# Patient Record
Sex: Female | Born: 1945
Health system: Southern US, Community
[De-identification: ages and names within clinical notes are randomized; demographics above are authoritative.]

## PROBLEM LIST (undated history)

## (undated) DIAGNOSIS — I499 Cardiac arrhythmia, unspecified: Secondary | ICD-10-CM

## (undated) DIAGNOSIS — K589 Irritable bowel syndrome without diarrhea: Secondary | ICD-10-CM

## (undated) DIAGNOSIS — F32A Depression, unspecified: Secondary | ICD-10-CM

## (undated) DIAGNOSIS — R002 Palpitations: Secondary | ICD-10-CM

## (undated) DIAGNOSIS — R112 Nausea with vomiting, unspecified: Secondary | ICD-10-CM

## (undated) DIAGNOSIS — Z780 Asymptomatic menopausal state: Secondary | ICD-10-CM

## (undated) DIAGNOSIS — Q223 Other congenital malformations of pulmonary valve: Secondary | ICD-10-CM

## (undated) DIAGNOSIS — F419 Anxiety disorder, unspecified: Secondary | ICD-10-CM

## (undated) DIAGNOSIS — R011 Cardiac murmur, unspecified: Secondary | ICD-10-CM

## (undated) DIAGNOSIS — Z9889 Other specified postprocedural states: Secondary | ICD-10-CM

## (undated) DIAGNOSIS — K5790 Diverticulosis of intestine, part unspecified, without perforation or abscess without bleeding: Secondary | ICD-10-CM

## (undated) DIAGNOSIS — I071 Rheumatic tricuspid insufficiency: Secondary | ICD-10-CM

## (undated) DIAGNOSIS — I5032 Chronic diastolic (congestive) heart failure: Secondary | ICD-10-CM

## (undated) DIAGNOSIS — M26609 Unspecified temporomandibular joint disorder, unspecified side: Secondary | ICD-10-CM

## (undated) DIAGNOSIS — R42 Dizziness and giddiness: Secondary | ICD-10-CM

## (undated) DIAGNOSIS — T7840XA Allergy, unspecified, initial encounter: Secondary | ICD-10-CM

## (undated) DIAGNOSIS — K635 Polyp of colon: Secondary | ICD-10-CM

## (undated) DIAGNOSIS — S0300XA Dislocation of jaw, unspecified side, initial encounter: Secondary | ICD-10-CM

## (undated) DIAGNOSIS — R739 Hyperglycemia, unspecified: Secondary | ICD-10-CM

## (undated) DIAGNOSIS — M47812 Spondylosis without myelopathy or radiculopathy, cervical region: Secondary | ICD-10-CM

## (undated) DIAGNOSIS — K219 Gastro-esophageal reflux disease without esophagitis: Secondary | ICD-10-CM

## (undated) DIAGNOSIS — I6529 Occlusion and stenosis of unspecified carotid artery: Secondary | ICD-10-CM

## (undated) DIAGNOSIS — I10 Essential (primary) hypertension: Secondary | ICD-10-CM

## (undated) DIAGNOSIS — Z2989 Encounter for other specified prophylactic measures: Secondary | ICD-10-CM

## (undated) DIAGNOSIS — N3090 Cystitis, unspecified without hematuria: Secondary | ICD-10-CM

## (undated) DIAGNOSIS — M75101 Unspecified rotator cuff tear or rupture of right shoulder, not specified as traumatic: Secondary | ICD-10-CM

## (undated) DIAGNOSIS — M1990 Unspecified osteoarthritis, unspecified site: Secondary | ICD-10-CM

## (undated) DIAGNOSIS — I739 Peripheral vascular disease, unspecified: Secondary | ICD-10-CM

## (undated) DIAGNOSIS — E7800 Pure hypercholesterolemia, unspecified: Secondary | ICD-10-CM

## (undated) DIAGNOSIS — D649 Anemia, unspecified: Secondary | ICD-10-CM

## (undated) DIAGNOSIS — Z87898 Personal history of other specified conditions: Secondary | ICD-10-CM

## (undated) DIAGNOSIS — Z298 Encounter for other specified prophylactic measures: Secondary | ICD-10-CM

## (undated) DIAGNOSIS — Z8619 Personal history of other infectious and parasitic diseases: Secondary | ICD-10-CM

## (undated) DIAGNOSIS — J302 Other seasonal allergic rhinitis: Secondary | ICD-10-CM

## (undated) DIAGNOSIS — G629 Polyneuropathy, unspecified: Secondary | ICD-10-CM

## (undated) DIAGNOSIS — R0600 Dyspnea, unspecified: Secondary | ICD-10-CM

## (undated) DIAGNOSIS — F329 Major depressive disorder, single episode, unspecified: Secondary | ICD-10-CM

## (undated) HISTORY — DX: Other congenital malformations of pulmonary valve: Q22.3

## (undated) HISTORY — DX: Palpitations: R00.2

## (undated) HISTORY — PX: EYE SURGERY: SHX253

## (undated) HISTORY — PX: ABDOMINAL HYSTERECTOMY: SHX81

## (undated) HISTORY — DX: Chronic diastolic (congestive) heart failure: I50.32

## (undated) HISTORY — DX: Peripheral vascular disease, unspecified: I73.9

## (undated) HISTORY — DX: Rheumatic tricuspid insufficiency: I07.1

## (undated) HISTORY — DX: Occlusion and stenosis of unspecified carotid artery: I65.29

## (undated) HISTORY — DX: Essential (primary) hypertension: I10

## (undated) HISTORY — DX: Polyneuropathy, unspecified: G62.9

## (undated) HISTORY — DX: Pure hypercholesterolemia, unspecified: E78.00

## (undated) HISTORY — DX: Spondylosis without myelopathy or radiculopathy, cervical region: M47.812

## (undated) HISTORY — DX: Hyperglycemia, unspecified: R73.9

## (undated) HISTORY — DX: Cystitis, unspecified without hematuria: N30.90

## (undated) HISTORY — DX: Asymptomatic menopausal state: Z78.0

## (undated) HISTORY — DX: Polyp of colon: K63.5

## (undated) HISTORY — DX: Personal history of other infectious and parasitic diseases: Z86.19

## (undated) HISTORY — PX: CHOLECYSTECTOMY: SHX55

## (undated) HISTORY — PX: BREAST BIOPSY: SHX20

---

## 2004-09-02 ENCOUNTER — Ambulatory Visit: Payer: Self-pay | Admitting: Internal Medicine

## 2004-09-19 ENCOUNTER — Inpatient Hospital Stay: Payer: Self-pay | Admitting: Internal Medicine

## 2006-02-03 ENCOUNTER — Other Ambulatory Visit: Payer: Self-pay

## 2006-02-03 ENCOUNTER — Inpatient Hospital Stay: Payer: Self-pay | Admitting: Surgery

## 2006-03-15 ENCOUNTER — Ambulatory Visit: Payer: Self-pay | Admitting: Unknown Physician Specialty

## 2006-04-27 ENCOUNTER — Ambulatory Visit: Payer: Self-pay | Admitting: Unknown Physician Specialty

## 2006-10-20 ENCOUNTER — Emergency Department: Payer: Self-pay | Admitting: Emergency Medicine

## 2008-02-14 ENCOUNTER — Ambulatory Visit: Payer: Self-pay | Admitting: Family Medicine

## 2008-02-19 ENCOUNTER — Ambulatory Visit: Payer: Self-pay | Admitting: Family Medicine

## 2008-02-26 ENCOUNTER — Ambulatory Visit: Payer: Self-pay | Admitting: Family Medicine

## 2008-02-27 ENCOUNTER — Ambulatory Visit: Payer: Self-pay | Admitting: Family Medicine

## 2008-06-12 ENCOUNTER — Ambulatory Visit: Payer: Self-pay | Admitting: Unknown Physician Specialty

## 2009-07-29 ENCOUNTER — Ambulatory Visit: Payer: Self-pay | Admitting: Family Medicine

## 2009-10-25 LAB — HM PAP SMEAR

## 2010-12-19 ENCOUNTER — Ambulatory Visit: Payer: Self-pay | Admitting: Family Medicine

## 2011-01-06 ENCOUNTER — Ambulatory Visit: Payer: Self-pay | Admitting: Specialist

## 2011-11-23 ENCOUNTER — Ambulatory Visit: Payer: Self-pay | Admitting: Family Medicine

## 2011-12-05 ENCOUNTER — Ambulatory Visit: Payer: Self-pay | Admitting: Specialist

## 2013-04-03 ENCOUNTER — Ambulatory Visit: Payer: Self-pay | Admitting: Family Medicine

## 2013-04-08 ENCOUNTER — Encounter: Payer: Self-pay | Admitting: Cardiovascular Disease

## 2013-04-08 ENCOUNTER — Ambulatory Visit (INDEPENDENT_AMBULATORY_CARE_PROVIDER_SITE_OTHER): Payer: Medicare Other | Admitting: Cardiovascular Disease

## 2013-04-08 ENCOUNTER — Telehealth: Payer: Self-pay | Admitting: *Deleted

## 2013-04-08 VITALS — BP 158/62 | HR 77 | Ht 67.0 in | Wt 174.2 lb

## 2013-04-08 DIAGNOSIS — R011 Cardiac murmur, unspecified: Secondary | ICD-10-CM | POA: Insufficient documentation

## 2013-04-08 DIAGNOSIS — R079 Chest pain, unspecified: Secondary | ICD-10-CM | POA: Insufficient documentation

## 2013-04-08 DIAGNOSIS — R059 Cough, unspecified: Secondary | ICD-10-CM

## 2013-04-08 DIAGNOSIS — R05 Cough: Secondary | ICD-10-CM

## 2013-04-08 DIAGNOSIS — I10 Essential (primary) hypertension: Secondary | ICD-10-CM

## 2013-04-08 DIAGNOSIS — R0602 Shortness of breath: Secondary | ICD-10-CM

## 2013-04-08 MED ORDER — CLONIDINE HCL 0.1 MG PO TABS: 0.1000 mg | ORAL_TABLET | Freq: Once | ORAL | Status: DC

## 2013-04-08 NOTE — Assessment & Plan Note (Signed)
Echocardiogram to evaluate cardiac function, right ventricular systolic pressures. Suspect some component of bronchospasm

## 2013-04-08 NOTE — Assessment & Plan Note (Signed)
Cough is concerning for reactive airway disease, bronchospasm. We have suggested she hold her ACE inhibitor for now to see if her cough gets better. Unable to exclude cardiac issue and echocardiogram has been ordered for this week.

## 2013-04-08 NOTE — Telephone Encounter (Signed)
Pharmacist has a question on her clonidine

## 2013-04-08 NOTE — Telephone Encounter (Signed)
Pharmacy is aware that Dr. Mariah Milling would like pt to take clonidine 1 table bid.

## 2013-04-08 NOTE — Assessment & Plan Note (Signed)
Echocardiogram has been ordered. Prior echocardiogram suggestive of pulmonary valve stenosis

## 2013-04-08 NOTE — Progress Notes (Signed)
Patient ID: Jessica Armstrong, female    DOB: [birth year], 67 y.o.   MRN: [id number]  HPI Comments: Ms. Jessica Armstrong is a present 67 year old woman, patient of Dr. Sullivan Lone presents by referral for evaluation of normal heart sounds/, cough.   She reports that she has significant secondhand exposure to smoke. For 30 years, she was very close to a heavy smoker and is concerned about secondary inhalation. She never smoked herself. Approximately 2 months ago, she developed runny nose, cold symptoms. She was treated with 2 courses of antibiotics, prednisone and various other medications. She has felt weak, had symptoms, nausea times, occasional chest fluttering. She has had worsening cough. Cough is at nighttime and daytime and unrelenting. She's been coughing to the extent that she has a coarse voice.   She had a chest x-ray 04/03/2013 suggesting reactive airway disease, unable to exclude COPD. She is concerned that some time she has ankle edema. She was recently started on prednisone taper. She's now down to 30 mg daily She was also started on various inhalers One of her biggest complaint is generalized  leg weakness  Previous carotid ultrasound in 2009 showed slight plaque formation bilaterally  EKG shows normal sinus rhythm with rate 77 beats per minute, no significant ST or T wave changes Notes from Dr. Sullivan Lone indicate that she has a diagnosis of pulmonary valve issue. 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 diazepam (VALIUM) 2 MG tablet Take 2 mg by mouth every 6 (six) hours as needed for anxiety.      Marland Kitchen estradiol (ESTRACE) 0.5 MG tablet Take 0.5 mg by mouth daily.      . fluticasone (FLONASE) 50 MCG/ACT nasal spray Place 1 spray into the nose daily.       . fluticasone (FLOVENT DISKUS) 50 MCG/BLIST diskus inhaler Inhale 2 puffs into the lungs 2 (two) times daily.      .  hydrochlorothiazide (HYDRODIURIL) 25 MG tablet Take 25 mg by mouth daily.      . meclizine (ANTIVERT) 25 MG tablet Take 25 mg by mouth daily as needed.      . meloxicam (MOBIC) 15 MG tablet Take 15 mg by mouth daily as needed for pain.      . Multiple Vitamin (MULTIVITAMIN) tablet Take 1 tablet by mouth daily.      Marland Kitchen omeprazole (PRILOSEC) 40 MG capsule Take 40 mg by mouth daily.      Marland Kitchen PARoxetine (PAXIL) 20 MG tablet Take 20 mg by mouth every morning.      . predniSONE (DELTASONE) 10 MG tablet Take 10 mg by mouth daily. Taper      . verapamil (COVERA HS) 180 MG (CO) 24 hr tablet Take 180 mg by mouth at bedtime.      Marland Kitchen  lisinopril (PRINIVIL,ZESTRIL) 20 MG tablet Take 20 mg by mouth daily.          Review of Systems  HENT: Negative.   Eyes: Negative.   Respiratory: Positive for cough and chest tightness.   Cardiovascular: Positive for chest pain and leg swelling.  Gastrointestinal: Negative.   Musculoskeletal: Negative.   Skin: Negative.   Neurological: Positive for weakness.  Psychiatric/Behavioral: Negative.   All other systems reviewed and are negative.    BP 158/62  Pulse 77  Ht 5\' 7"  (1.702 m)  Wt 174 lb 4 oz (79.039 kg)  BMI 27.28 kg/m2  Physical Exam  Nursing note and vitals reviewed. Constitutional: She is oriented to person, place, and time. She appears well-developed and well-nourished.  Significant coughing on today's visit, consistent with bronchospastic airway disease  HENT:  Head: Normocephalic.  Nose: Nose normal.  Mouth/Throat: Oropharynx is clear and moist.  Eyes: Conjunctivae are normal. Pupils are equal, round, and reactive to light.  Neck: Normal range of motion. Neck supple. No JVD present.  Cardiovascular: Normal rate, regular rhythm, S1 normal, S2 normal, normal heart sounds and intact distal pulses.  Exam reveals no gallop and no friction rub.   No murmur heard. Pulmonary/Chest: Effort normal and breath sounds normal. No respiratory distress. She has  no wheezes. She has no rales. She exhibits no tenderness.  Abdominal: Soft. Bowel sounds are normal. She exhibits no distension. There is no tenderness.  Musculoskeletal: Normal range of motion. She exhibits no edema and no tenderness.  Lymphadenopathy:    She has no cervical adenopathy.  Neurological: She is alert and oriented to person, place, and time. Coordination normal.  Skin: Skin is warm and dry. No rash noted. No erythema.  Psychiatric: She has a normal mood and affect. Her behavior is normal. Judgment and thought content normal.    Assessment and Plan

## 2013-04-08 NOTE — Assessment & Plan Note (Signed)
We'll hold her ACE inhibitor to see if the cough gets better. Options for blood pressure control are losartan or clonidine. There are other options as well . We did call clonidine into the pharmacy. she will see Dr. Sullivan Lone in 2 days' time and we have suggested she talk with him about which medicine might be the best for her.

## 2013-04-08 NOTE — Patient Instructions (Addendum)
Stop the lisinopril (this can cause a cough) Start clonidine one pill twice a day (we could also start losartan instead)  We will schedule an echocardiogram for SOB, cough, chest tightness, murmur  Please call us if you have new issues that need to be addressed before your next appt.

## 2013-04-08 NOTE — Assessment & Plan Note (Signed)
Suspect her chest tightness is from significant coughing daytime and nighttime. No further ischemia workup at this time.

## 2013-04-10 ENCOUNTER — Other Ambulatory Visit (INDEPENDENT_AMBULATORY_CARE_PROVIDER_SITE_OTHER): Payer: Medicare Other

## 2013-04-10 ENCOUNTER — Other Ambulatory Visit: Payer: Self-pay

## 2013-04-10 DIAGNOSIS — R05 Cough: Secondary | ICD-10-CM

## 2013-04-10 DIAGNOSIS — R0602 Shortness of breath: Secondary | ICD-10-CM

## 2013-04-10 DIAGNOSIS — R059 Cough, unspecified: Secondary | ICD-10-CM

## 2013-04-10 DIAGNOSIS — R011 Cardiac murmur, unspecified: Secondary | ICD-10-CM

## 2013-04-10 DIAGNOSIS — R079 Chest pain, unspecified: Secondary | ICD-10-CM

## 2013-04-14 ENCOUNTER — Telehealth: Payer: Self-pay

## 2013-04-14 NOTE — Telephone Encounter (Signed)
Pt would like echo results 

## 2013-04-16 ENCOUNTER — Encounter: Payer: Self-pay | Admitting: *Deleted

## 2013-04-18 ENCOUNTER — Encounter: Payer: Self-pay | Admitting: Pulmonary Disease

## 2013-04-21 ENCOUNTER — Encounter: Payer: Self-pay | Admitting: Pulmonary Disease

## 2013-04-21 ENCOUNTER — Ambulatory Visit (INDEPENDENT_AMBULATORY_CARE_PROVIDER_SITE_OTHER): Payer: Medicare Other | Admitting: Pulmonary Disease

## 2013-04-21 VITALS — BP 178/80 | HR 98 | Temp 97.9°F | Ht 67.0 in | Wt 171.4 lb

## 2013-04-21 DIAGNOSIS — R059 Cough, unspecified: Secondary | ICD-10-CM

## 2013-04-21 DIAGNOSIS — R042 Hemoptysis: Secondary | ICD-10-CM | POA: Insufficient documentation

## 2013-04-21 DIAGNOSIS — I10 Essential (primary) hypertension: Secondary | ICD-10-CM

## 2013-04-21 DIAGNOSIS — R05 Cough: Secondary | ICD-10-CM

## 2013-04-21 MED ORDER — OLMESARTAN MEDOXOMIL 40 MG PO TABS: 20.0000 mg | ORAL_TABLET | Freq: Every day | ORAL | Status: DC

## 2013-04-21 NOTE — Progress Notes (Signed)
Subjective:    Patient ID: Jessica Armstrong, female    DOB: February 05, 1946, 67 y.o.   MRN: [id number]  HPI   Jessica Armstrong is a very pleasant 67 year old female who comes her clinic today for evaluation of cough. She states that she had a normal childhood without respiratory illnesses that throughout adulthood she's been prescribed albuterol often on for cough and shortness of breath. She is unaware of a prior diagnosis of asthma or COPD. 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 She says that the other person working in the room with her at smoke multiple packs of cigarettes per day and if there is frequently "a cloud of smoke" in the room.  For the last 2 half months she's had a cough associated with sinus congestion. She's been treated with 3 separate antibiotics which she said has not helped. She never has significant headache or sinus pressure but she still has a postnasal drip. His problem is been associated with hoarseness. She feels a constant dry, scratchy throat. She coughs all day. Talking more make her cough more. About 6 weeks ago she was started on lisinopril. She took it for a total of about 2-1/2 weeks and then this was held by her cardiologist. She does not cough at night. She does not associate the cough with heartburn. She does not feel certain foods will make her cough any worse or better.    Past Medical History  Diagnosis Date  . Hypercholesterolemia   . Post-menopausal   . Hypertension   . Claudication   . Hyperglycemia   . Cystitis   . Palpitations   . Neuropathy   . Rhinitis   . Colon polyp   . History of measles   . History of mumps   . Pulmonary valve dysplasia      Family History  Problem Relation Age of Onset  . Heart disease Mother   . Hypertension Mother   . Hypertension Brother   . Colon cancer Father   . Asthma Maternal Grandfather   . COPD Mother     was a smoker     History   Social  History  . Marital Status: Married    Spouse Name: N/A    Number of Children: N/A  . Years of Education: N/A   Occupational History  . Not on file.   Social History Main Topics  . Smoking status: Never Smoker   . 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 Alcohol Use: Yes     Comment: rare use 1-2 drinks per tyear.   . Drug Use: No  . Sexually Active: Not on file   Other Topics Concern  . Not on file   Social History Narrative  . No narrative on file     Allergies  Allergen Reactions  . Codeine Sulfate   . Keflex (Cephalexin)   . Macrobid (Nitrofurantoin Macrocrystal)   . Sulfa Antibiotics      Outpatient Prescriptions Prior to Visit  Medication Sig Dispense Refill  . estradiol (ESTRACE) 0.5 MG tablet Take 0.5 mg by mouth daily.      . hydrochlorothiazide (HYDRODIURIL) 25 MG tablet Take 25 mg by mouth daily.      . meclizine (ANTIVERT) 25 MG tablet Take 25 mg by mouth daily as needed.      . meloxicam (MOBIC) 15 MG tablet Take  15 mg by mouth daily as needed for pain.      . Multiple Vitamin (MULTIVITAMIN) tablet Take 1 tablet by mouth daily.      Marland Kitchen omeprazole (PRILOSEC) 40 MG capsule Take 40 mg by mouth daily.      Marland Kitchen PARoxetine (PAXIL) 20 MG tablet Take 20 mg by mouth every morning.      . verapamil (COVERA HS) 180 MG (CO) 24 hr tablet Take 180 mg by mouth at bedtime.      . cloNIDine (CATAPRES) 0.1 MG tablet Take 1 tablet (0.1 mg total) by mouth once.  60 tablet  11  . diazepam (VALIUM) 2 MG tablet Take 2 mg by mouth every 6 (six) hours as needed for anxiety.      . fluticasone (FLONASE) 50 MCG/ACT nasal spray Place 1 spray into the nose daily.       . fluticasone (FLOVENT DISKUS) 50 MCG/BLIST diskus inhaler Inhale 2 puffs into the lungs 2 (two) times daily.      . predniSONE (DELTASONE) 10 MG tablet Take 10 mg by mouth daily. Taper       No facility-administered medications prior to visit.      Review of  Systems  Constitutional: Negative for fever, chills, diaphoresis, appetite change and fatigue.  HENT: Positive for rhinorrhea and postnasal drip. Negative for hearing loss, nosebleeds, congestion, sore throat, trouble swallowing, neck stiffness and sinus pressure.   Eyes: Negative for discharge, redness and visual disturbance.  Respiratory: Positive for cough and shortness of breath. Negative for choking, chest tightness and wheezing.   Cardiovascular: Negative for chest pain and leg swelling.  Gastrointestinal: Negative for nausea, abdominal pain, diarrhea, constipation and blood in stool.  Genitourinary: Negative for dysuria, frequency and hematuria.  Musculoskeletal: Negative for myalgias, joint swelling and arthralgias.  Skin: Negative for color change, pallor and rash.  Neurological: Negative for dizziness, seizures, facial asymmetry, speech difficulty, light-headedness, numbness and headaches.  Hematological: Negative for adenopathy. Does not bruise/bleed easily.       Objective:   Physical Exam  Filed Vitals:   04/21/13 1041  BP: 178/80  Pulse: 98  Temp: 97.9 F (36.6 C)  TempSrc: Oral  Height: 5\' 7"  (1.702 m)  Weight: 171 lb 6.4 oz (77.747 kg)  SpO2: 95%   Gen: well appearing, no acute distress HEENT: NCAT, PERRL, EOMi, OP clear, neck supple without masses PULM: CTA B CV: RRR, systolic murmur noted, no JVD AB: BS+, soft, nontender, no hsm Ext: warm, no edema, no clubbing, no cyanosis Derm: no rash or skin breakdown Neuro: A&Ox4, CN II-XII intact, strength 5/5 in all 4 extremities       Assessment & Plan:   Essential hypertension Her lisinopril was recently held (with which I completely agree) because of her cough. However unfortunately she is still markedly hypertensive today. We will prescribe Benicar 20 mg daily.  Cough I explained to Jessica Armstrong that I think the most likely etiology of her cough is postnasal drip and cyclical cough (when cough causes upper airway  inflammation in your patient does promoting more cough).  It is possible that lisinopril could contribute somewhat during the brief time that she was taking it.  She coughed up blood once in the last few weeks which I think is likely due to upper airway eructation from all the coughing, but considering her significant secondhand smoke exposure over the years it is reasonable to order a CT scan to look for a clear cause of this.  Her simple  spirometry today was normal, thus she does not have COPD. I wonder if she has some mild intermittent asthma as she has been treated with albuterol off and on over the years. However, she does not describe shortness of breath or chest tightness and so this seems less likely right now.  Plan: -Voice rest for 3 days with Delsym to suppress cough and hard candies for voice rest and active cough suppression -Hold Dulera -Start Nasacort for postnasal drip -Over-the-counter antihistamine -Followup with [place] in 6-8 weeks  Hemoptysis This occurred only one time and therefore I think it is likely related to mild bronchitis versus inflammation in her airways from recurrent coughing. However, considering her sick significant secondhand smoke exposure we will set up a CT scan as she is quite worried about the possibility of lung cancer.   Updated Medication List Outpatient Encounter Prescriptions as of 04/21/2013  Medication Sig Dispense Refill  . albuterol (PROAIR HFA) 108 (90 BASE) MCG/ACT inhaler Inhale 2 puffs into the lungs every 6 (six) hours as needed for wheezing or shortness of breath.      Tery Sanfilippo Calcium (STOOL SOFTENER PO) Take 1 tablet by mouth daily.      Marland Kitchen estradiol (ESTRACE) 0.5 MG tablet Take 0.5 mg by mouth daily.      . hydrochlorothiazide (HYDRODIURIL) 25 MG tablet Take 25 mg by mouth daily.      . meclizine (ANTIVERT) 25 MG tablet Take 25 mg by mouth daily as needed.      . meloxicam (MOBIC) 15 MG tablet Take 15 mg by mouth daily as needed for  pain.      . Multiple Vitamin (MULTIVITAMIN) tablet Take 1 tablet by mouth daily.      Marland Kitchen omeprazole (PRILOSEC) 40 MG capsule Take 40 mg by mouth daily.      Marland Kitchen PARoxetine (PAXIL) 20 MG tablet Take 20 mg by mouth every morning.      . verapamil (COVERA HS) 180 MG (CO) 24 hr tablet Take 180 mg by mouth at bedtime.      . mometasone-formoterol (DULERA) 200-5 MCG/ACT AERO Inhale 2 puffs into the lungs 2 (two) times daily.      Marland Kitchen olmesartan (BENICAR) 40 MG tablet Take 0.5 tablets (20 mg total) by mouth daily.  30 tablet  2  . [DISCONTINUED] cloNIDine (CATAPRES) 0.1 MG tablet Take 1 tablet (0.1 mg total) by mouth once.  60 tablet  11  . [DISCONTINUED] diazepam (VALIUM) 2 MG tablet Take 2 mg by mouth every 6 (six) hours as needed for anxiety.      . [DISCONTINUED] fluticasone (FLONASE) 50 MCG/ACT nasal spray Place 1 spray into the nose daily.       . [DISCONTINUED] fluticasone (FLOVENT DISKUS) 50 MCG/BLIST diskus inhaler Inhale 2 puffs into the lungs 2 (two) times daily.      . [DISCONTINUED] predniSONE (DELTASONE) 10 MG tablet Take 10 mg by mouth daily. Taper       No facility-administered encounter medications on file as of 04/21/2013.

## 2013-04-21 NOTE — Assessment & Plan Note (Signed)
This occurred only one time and therefore I think it is likely related to mild bronchitis versus inflammation in her airways from recurrent coughing. However, considering her sick significant secondhand smoke exposure we will set up a CT scan as she is quite worried about the possibility of lung cancer.

## 2013-04-21 NOTE — Assessment & Plan Note (Signed)
Her lisinopril was recently held (with which I completely agree) because of her cough. However unfortunately she is still markedly hypertensive today. We will prescribe Benicar 20 mg daily.

## 2013-04-21 NOTE — Assessment & Plan Note (Signed)
I explained to Jessica Armstrong that I think the most likely etiology of her cough is postnasal drip and cyclical cough (when cough causes upper airway inflammation in your patient does promoting more cough).  It is possible that lisinopril could contribute somewhat during the brief time that she was taking it.  She coughed up blood once in the last few weeks which I think is likely due to upper airway eructation from all the coughing, but considering her significant secondhand smoke exposure over the years it is reasonable to order a CT scan to look for a clear cause of this.  Her simple spirometry today was normal, thus she does not have COPD. I wonder if she has some mild intermittent asthma as she has been treated with albuterol off and on over the years. However, she does not describe shortness of breath or chest tightness and so this seems less likely right now.  Plan: -Voice rest for 3 days with Delsym to suppress cough and hard candies for voice rest and active cough suppression -Hold Dulera -Start Nasacort for postnasal drip -Over-the-counter antihistamine -Followup with [place] in 6-8 weeks

## 2013-04-21 NOTE — Patient Instructions (Addendum)
We will set up a CT scan of your chest to look for the cause of you coughing up blood  I want you to rest your voice for three days.  Use Delsym over the counter regularly during this time as well as chlorpheniramine (both over the counter).  Also use hard candies, hot beverages to sooth your throat.  Don't talk, laugh, or sing and try your best to suppress the cough during this time.  Use Nasacort over the counter for the sinus congestion.  2 puffs each nostril daily.  We will see you back in 6-8 weeks or sooner if needed

## 2013-04-24 ENCOUNTER — Ambulatory Visit: Payer: Self-pay | Admitting: Pulmonary Disease

## 2013-04-28 ENCOUNTER — Telehealth: Payer: Self-pay | Admitting: Family Medicine

## 2013-04-29 ENCOUNTER — Telehealth: Payer: Self-pay | Admitting: Pulmonary Disease

## 2013-04-29 DIAGNOSIS — R042 Hemoptysis: Secondary | ICD-10-CM

## 2013-04-29 NOTE — Telephone Encounter (Signed)
Pt calling again in ref to previous msg says she's unhappy that noone has called her with this.Jessica Armstrong

## 2013-04-29 NOTE — Telephone Encounter (Signed)
Pt is advised of results. Pt still wants Dr. Henrene Pastor to call her once he returns so she can discuss the plan from here. Carron Curie, CMA

## 2013-04-29 NOTE — Telephone Encounter (Signed)
I spoke with the pt and she is very upset that she has not been given CT results. She states that the CT tech at [facility] told her that the results would be available in 24 hours. I advised that once we receive the results the MD has to review them. Pt has CT on 04-24-13 and BQ has been out of office since then. I advised the pt that BQ does not return until Monday. Pt was very upset so I offered to try to get another MD to review the CT results. Pt states to just forget it and have BQ cal lon his return. I have the results and will ask MW to review results. I have placed results in MW look-at.  Please advise. Carron Curie, CMA

## 2013-04-29 NOTE — Telephone Encounter (Signed)
Ct is completely negative for any abnormality or source of hemoptysis

## 2013-05-05 ENCOUNTER — Telehealth: Payer: Self-pay | Admitting: Pulmonary Disease

## 2013-05-05 ENCOUNTER — Ambulatory Visit: Payer: Self-pay | Admitting: Family Medicine

## 2013-05-05 NOTE — Telephone Encounter (Signed)
Tried to call patient to discuss results of CT chest (normal).  Had to leave a message.

## 2013-05-06 ENCOUNTER — Telehealth: Payer: Self-pay | Admitting: *Deleted

## 2013-05-06 DIAGNOSIS — R0602 Shortness of breath: Secondary | ICD-10-CM

## 2013-05-06 NOTE — Telephone Encounter (Signed)
Please scheduled full PFT's for her at [facility] before she comes back to see me  Pt is aware of PFT. Carron Curie, CMA

## 2013-05-06 NOTE — Telephone Encounter (Signed)
Order sent to [facility] for the PFT  [facility] for the pt to make her aware

## 2013-05-06 NOTE — Telephone Encounter (Signed)
Message copied by Christen Butter on Tue May 06, 2013 10:07 AM ------      Message from: Max Fickle B      Created: Tue May 06, 2013  9:25 AM       L,            Please scheduled full PFT's for her at [facility] before she comes back to see me            Thanks      B ------

## 2013-05-06 NOTE — Telephone Encounter (Signed)
Pt returned call. She says leslie called her and asked her to call our office @ [phone]. Pt says "they were trying to schedule some type of test perhaps". Call pt at [phone]. Jessica Armstrong

## 2013-05-13 ENCOUNTER — Ambulatory Visit: Payer: Self-pay | Admitting: Family Medicine

## 2013-05-14 ENCOUNTER — Other Ambulatory Visit: Payer: Self-pay

## 2013-05-20 ENCOUNTER — Ambulatory Visit: Payer: Medicare Other

## 2013-05-20 VITALS — BP 162/68 | Ht 67.0 in | Wt 172.8 lb

## 2013-05-20 DIAGNOSIS — I10 Essential (primary) hypertension: Secondary | ICD-10-CM

## 2013-05-20 MED ORDER — LOSARTAN POTASSIUM 50 MG PO TABS: 50.0000 mg | ORAL_TABLET | Freq: Every day | ORAL | Status: DC

## 2013-05-20 NOTE — Progress Notes (Signed)
Pt presents in office today after having gone to Dr Elisabeth Cara office to pick up paperwork and requesting a BP check secondary to lethargy and feeling poorly.  Pt states they checked her BP and reported it as 162/20 and told her she needed to see her cardiologist ASAP.  Checked BP here today in office BP 186/78 1st check, 162/68 2nd check.  Pt is presently on Verapamil 180mg  BID and HCTZ 25mg  QD.  Pt has previously been on Lisinopril d/c secondary to cough.  Changed to Clonidine by Dr Mariah Milling which was discontinued by Dr Sullivan Lone.  Discussed BP with Dr Mariah Milling he recommends pt start taking Losartan 50mg  QD, rx sent into pharmacy, pt aware.  Pt made f/u appt with him in 2-4 weeks to discuss Echo results and her lethargy.

## 2013-05-22 ENCOUNTER — Ambulatory Visit: Payer: Medicare Other | Admitting: Cardiovascular Disease

## 2013-05-27 ENCOUNTER — Ambulatory Visit: Payer: Self-pay | Admitting: Pulmonary Disease

## 2013-05-27 LAB — PULMONARY FUNCTION TEST

## 2013-06-10 ENCOUNTER — Ambulatory Visit (INDEPENDENT_AMBULATORY_CARE_PROVIDER_SITE_OTHER): Payer: Medicare Other | Admitting: Pulmonary Disease

## 2013-06-10 ENCOUNTER — Encounter: Payer: Self-pay | Admitting: Pulmonary Disease

## 2013-06-10 VITALS — BP 128/64 | HR 100 | Temp 97.9°F | Ht 67.0 in | Wt 173.1 lb

## 2013-06-10 DIAGNOSIS — R059 Cough, unspecified: Secondary | ICD-10-CM

## 2013-06-10 DIAGNOSIS — R05 Cough: Secondary | ICD-10-CM

## 2013-06-10 DIAGNOSIS — R232 Flushing: Secondary | ICD-10-CM | POA: Insufficient documentation

## 2013-06-10 DIAGNOSIS — N951 Menopausal and female climacteric states: Secondary | ICD-10-CM

## 2013-06-10 LAB — TSH: TSH: 2.39 u[IU]/mL (ref 0.35–5.50)

## 2013-06-10 MED ORDER — FLUTICASONE PROPIONATE 50 MCG/ACT NA SUSP: 2.0000 | Freq: Every day | NASAL | Status: DC

## 2013-06-10 NOTE — Assessment & Plan Note (Signed)
Jessica Armstrong's cough has nearly completely resolved.  I think that it was mostly due to the [employer] and what is left is related to post nasal drip.   Plan: -start Flonase 2 sprays each nostril daily -generic zyrtec -f/u with me in 6 months or sooner if needed

## 2013-06-10 NOTE — Progress Notes (Signed)
Subjective:    Patient ID: Jessica Armstrong, female    DOB: 07/18/1946, 67 y.o.   MRN: [id number]  Synopsis: Ms. Jessica Armstrong first saw the [facility] in the summer of 2014 for cough due to an ACE-In as well as post nasal drip. HPI   06/10/2013 ROV >>  Chief Complaint  Patient presents with  . Follow-up    Pt states that her cough is improving, and is no longer prod. She states breathing is unchanged, "not breathing comfortably". She also c/o sweating-with or without exertion and occurs day or night. x 2 wks.    Since the last visit Kavina's cough has improved significantly.  She has not taken any more lisinopril. She still has some post nasal drip and mild hoarseness.  She has not taken anything for the post nasal drip.  She denies dyspnea.  She has not been taking the [facility].   Past Medical History  Diagnosis Date  . Hypercholesterolemia   . Post-menopausal   . Hypertension   . Claudication   . Hyperglycemia   . Cystitis   . Palpitations   . Neuropathy   . Rhinitis   . Colon polyp   . History of measles   . History of mumps   . Pulmonary valve dysplasia      Review of Systems  Constitutional: Negative for fever, chills and fatigue.  HENT: Positive for congestion, rhinorrhea and postnasal drip.   Respiratory: Positive for cough. Negative for shortness of breath and wheezing.   Cardiovascular: Negative for chest pain, palpitations and leg swelling.       Objective:   Physical Exam  Filed Vitals:   06/10/13 1331  BP: 128/64  Pulse: 100  Temp: 97.9 F (36.6 C)  TempSrc: Oral  Height: 5\' 7"  (1.702 m)  Weight: 173 lb 1.9 oz (78.527 kg)  SpO2: 96%   Gen: no acute distress HEENT: NCAT, PERRL PULM: CTA B CV: RRR, no mgr AB: soft, non distended or tender Ext: warm, no edema      Assessment & Plan:   Cough Kayah's cough has nearly completely resolved.  I think that it was mostly due to the Ace-Inhibitor and what is left is related to post nasal drip.    Plan: -start Flonase 2 sprays each nostril daily -generic zyrtec -f/u with me in 6 months or sooner if needed  Hot flashes She notes intermittent dyspnea, hot flashes and sweating.  This sounds like a hormonal issue.  Will check a TSH, but if that is normal she will need to follow up with her PCP for a further work up.    Updated Medication List Outpatient Encounter Prescriptions as of 06/10/2013  Medication Sig Dispense Refill  . albuterol (PROAIR HFA) 108 (90 BASE) MCG/ACT inhaler Inhale 2 puffs into the lungs every 6 (six) hours as needed for wheezing or shortness of breath.      Tery Sanfilippo Calcium (STOOL SOFTENER PO) Take 1 tablet by mouth daily.      Marland Kitchen estradiol (ESTRACE) 0.5 MG tablet Take 0.5 mg by mouth daily.      . hydrochlorothiazide (HYDRODIURIL) 25 MG tablet Take 25 mg by mouth daily.      Marland Kitchen losartan (COZAAR) 50 MG tablet Take 1 tablet (50 mg total) by mouth daily.  30 tablet  6  . meclizine (ANTIVERT) 25 MG tablet Take 25 mg by mouth daily as needed.      . Multiple Vitamin (MULTIVITAMIN) tablet Take 1 tablet by mouth  daily.      . omeprazole (PRILOSEC) 40 MG capsule Take 40 mg by mouth daily.      Marland Kitchen PARoxetine (PAXIL) 20 MG tablet Take 20 mg by mouth every morning.      . verapamil (COVERA HS) 180 MG (CO) 24 hr tablet Take 360 mg by mouth at bedtime.       . [DISCONTINUED] meloxicam (MOBIC) 15 MG tablet Take 15 mg by mouth daily as needed for pain.      . [DISCONTINUED] mometasone-formoterol (DULERA) 200-5 MCG/ACT AERO Inhale 2 puffs into the lungs 2 (two) times daily.      . [DISCONTINUED] olmesartan (BENICAR) 40 MG tablet Take 0.5 tablets (20 mg total) by mouth daily.  30 tablet  2   No facility-administered encounter medications on file as of 06/10/2013.

## 2013-06-10 NOTE — Assessment & Plan Note (Signed)
She notes intermittent dyspnea, hot flashes and sweating.  This sounds like a hormonal issue.  Will check a TSH, but if that is normal she will need to follow up with her PCP for a further work up.

## 2013-06-10 NOTE — Addendum Note (Signed)
Addended by: Montine Circle D on: 06/10/2013 02:27 PM   Modules accepted: Orders

## 2013-06-10 NOTE — Patient Instructions (Signed)
Take the flonase 2 sprays each nostril daily  Take the zyrtec daily (cetirizine generic)  We will call you with the result of the blood work  Follow up with Dr. Sullivan Lone regarding the hot flashes  We will see you back in 6 months or sooner if needed

## 2013-06-17 ENCOUNTER — Telehealth: Payer: Self-pay | Admitting: Pulmonary Disease

## 2013-06-17 MED ORDER — FLUTICASONE PROPIONATE 50 MCG/ACT NA SUSP: 2.0000 | Freq: Every day | NASAL | Status: DC

## 2013-06-17 NOTE — Telephone Encounter (Signed)
The patient wants her prescription for fluticasone (FLONASE) 50 MCG/ACT nasal spray   sent to [facility] instead of [facility]. 90 day supply.

## 2013-06-17 NOTE — Telephone Encounter (Signed)
Rx has been sent to [facility] per the pt's request. Pt is aware. Nothing further is needed.

## 2013-06-19 ENCOUNTER — Encounter: Payer: Self-pay | Admitting: Pulmonary Disease

## 2013-07-18 ENCOUNTER — Ambulatory Visit: Payer: Self-pay | Admitting: Unknown Physician Specialty

## 2013-07-18 LAB — CBC WITH DIFFERENTIAL/PLATELET
Basophil #: 0.1 10*3/uL (ref 0.0–0.1)
Basophil %: 0.6 %
Eosinophil #: 0.2 10*3/uL (ref 0.0–0.7)
Eosinophil %: 1.9 %
HCT: 34.5 % — ABNORMAL LOW (ref 35.0–47.0)
HGB: 11.9 g/dL — ABNORMAL LOW (ref 12.0–16.0)
Lymphocyte #: 1.4 10*3/uL (ref 1.0–3.6)
Lymphocyte %: 14.8 %
MCH: 30.9 pg (ref 26.0–34.0)
MCHC: 34.6 g/dL (ref 32.0–36.0)
MCV: 89 fL (ref 80–100)
Monocyte #: 0.8 x10 3/mm (ref 0.2–0.9)
Monocyte %: 8.8 %
Neutrophil #: 7.1 10*3/uL — ABNORMAL HIGH (ref 1.4–6.5)
Neutrophil %: 73.9 %
Platelet: 241 10*3/uL (ref 150–440)
RBC: 3.87 10*6/uL (ref 3.80–5.20)
RDW: 12 % (ref 11.5–14.5)
WBC: 9.7 10*3/uL (ref 3.6–11.0)

## 2013-07-18 LAB — HM COLONOSCOPY

## 2013-07-21 LAB — PATHOLOGY REPORT

## 2013-08-04 ENCOUNTER — Other Ambulatory Visit: Payer: Self-pay | Admitting: Neurosurgery

## 2013-08-14 ENCOUNTER — Other Ambulatory Visit: Payer: Self-pay

## 2013-08-15 ENCOUNTER — Encounter (HOSPITAL_COMMUNITY)
Admission: RE | Admit: 2013-08-15 | Discharge: 2013-08-15 | Disposition: A | Discharge status: Home / Self Care | Payer: Medicare Other | Source: Ambulatory Visit | Attending: Anesthesiology | Admitting: Anesthesiology

## 2013-08-15 ENCOUNTER — Encounter (HOSPITAL_COMMUNITY): Payer: Self-pay

## 2013-08-15 ENCOUNTER — Encounter (HOSPITAL_COMMUNITY)
Admission: RE | Admit: 2013-08-15 | Discharge: 2013-08-15 | Disposition: A | Discharge status: Home / Self Care | Payer: Medicare Other | Source: Ambulatory Visit | Attending: Neurosurgery | Admitting: Neurosurgery

## 2013-08-15 DIAGNOSIS — Z01818 Encounter for other preprocedural examination: Secondary | ICD-10-CM | POA: Insufficient documentation

## 2013-08-15 DIAGNOSIS — Z01812 Encounter for preprocedural laboratory examination: Secondary | ICD-10-CM | POA: Insufficient documentation

## 2013-08-15 HISTORY — DX: Depression, unspecified: F32.A

## 2013-08-15 HISTORY — DX: Encounter for other specified prophylactic measures: Z29.89

## 2013-08-15 HISTORY — DX: Other specified postprocedural states: Z98.890

## 2013-08-15 HISTORY — DX: Gastro-esophageal reflux disease without esophagitis: K21.9

## 2013-08-15 HISTORY — DX: Cardiac murmur, unspecified: R01.1

## 2013-08-15 HISTORY — DX: Other seasonal allergic rhinitis: J30.2

## 2013-08-15 HISTORY — DX: Anemia, unspecified: D64.9

## 2013-08-15 HISTORY — DX: Major depressive disorder, single episode, unspecified: F32.9

## 2013-08-15 HISTORY — DX: Encounter for other specified prophylactic measures: Z29.8

## 2013-08-15 HISTORY — DX: Personal history of other specified conditions: Z87.898

## 2013-08-15 HISTORY — DX: Unspecified osteoarthritis, unspecified site: M19.90

## 2013-08-15 HISTORY — DX: Other specified postprocedural states: R11.2

## 2013-08-15 LAB — CBC
HCT: 39.2 % (ref 36.0–46.0)
Hemoglobin: 13.5 g/dL (ref 12.0–15.0)
MCH: 30.5 pg (ref 26.0–34.0)
MCHC: 34.4 g/dL (ref 30.0–36.0)
MCV: 88.7 fL (ref 78.0–100.0)
Platelets: 260 10*3/uL (ref 150–400)
RBC: 4.42 MIL/uL (ref 3.87–5.11)
RDW: 12.2 % (ref 11.5–15.5)
WBC: 7.7 10*3/uL (ref 4.0–10.5)

## 2013-08-15 LAB — SURGICAL PCR SCREEN
MRSA, PCR: POSITIVE — AB
Staphylococcus aureus: POSITIVE — AB

## 2013-08-15 LAB — BASIC METABOLIC PANEL
BUN: 24 mg/dL — ABNORMAL HIGH (ref 6–23)
CO2: 23 mEq/L (ref 19–32)
Calcium: 9.6 mg/dL (ref 8.4–10.5)
Chloride: 102 mEq/L (ref 96–112)
Creatinine, Ser: 0.87 mg/dL (ref 0.50–1.10)
GFR calc Af Amer: 77 mL/min — ABNORMAL LOW (ref >90)
GFR calc non Af Amer: 66 mL/min — ABNORMAL LOW (ref >90)
Glucose, Bld: 115 mg/dL — ABNORMAL HIGH (ref 70–99)
Potassium: 4.4 mEq/L (ref 3.5–5.1)
Sodium: 138 mEq/L (ref 135–145)

## 2013-08-15 NOTE — Pre-Procedure Instructions (Addendum)
Jessica Armstrong  08/15/2013   Your procedure is scheduled on:  Tuesday, November 18th.  Report to [facility], Main Entrance/Entrance "A" at 1200 noon.  Call this number if you have problems the morning of surgery: [phone]   Remember:   Do not eat food or drink liquids after midnight after midnight Monday.    Take these medicines the morning of surgery with A SIP OF WATER: estradiol (ESTRACE),  omeprazole (PRILOSEC), PARoxetine (PAXIL), verapamil (COVERA HS). May use: fluticasone (FLONASE) and albuterol (PROAIR HFA) - bring Albuterol inhaler to the hospital with you.  May take if needed: diazepam (VALIUM), meclizine (ANTIVERT).   Do not wear jewelry, make-up or nail polish.  Do not wear lotions, powders, or perfumes. You may wear deodorant.  Do not shave 48 hours prior to surgery.   Do not bring valuables to the hospital.  [facility] is not responsible for any belongings or valuables.               Contacts, dentures or bridgework may not be worn into surgery.  Leave suitcase in the car. After surgery it may be brought to your room.  For patients admitted to the hospital, discharge time is determined by your  treatment team.                Special Instructions: Shower using CHG 2 nights before surgery and the night before surgery.  If you shower the day of surgery use CHG.  Use special wash - you have one bottle of CHG for all showers.  You should use approximately 1/3 of the bottle for each shower.   Please read over the following fact sheets that you were given: Pain Booklet, Coughing and Deep Breathing, Blood Transfusion Information and Surgical Site Infection Prevention

## 2013-08-25 MED ORDER — VANCOMYCIN HCL IN DEXTROSE 1-5 GM/200ML-% IV SOLN
1000.0000 mg | INTRAVENOUS | Status: AC
  Administered 2013-08-26: 1000 mg via INTRAVENOUS

## 2013-08-25 NOTE — H&P (Signed)
> [address] [place], [place] [id number] Phone: [phone]   Patient ID:   [id number] [name]  Date of Birth: 17-Jun-1946 Visit Type: Office Visit   Date: 07/28/2013 10:30 AM Provider: Danae Orleans. Venetia Maxon    This 67 year old female presents for neck pain.  HISTORY OF PRESENT ILLNESS: 1.  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  She denies injury.  She also notes right thigh pain with certain ROM.   Two courses of prednisone offered only temporary and partial pain relief.  Hx: Heart murmur & HTN.  MRI & x-rays on [facility]  Patient feels that her right hand and arm and also right thigh have pain, numbness, weakness.  She says this is been going on for last 4 months.  She took 2 courses of prednisone which she said helped initially but then made her "nuts" she describes her head is shot fill of novocaine" she complains of her right arm and leg get numb.     PAST MEDICAL/SURGICAL HISTORY  (Detailed)  Disease/disorder Onset Date Management Date Comments    hysterectomy 1988     Cholecystectomy        PAST MEDICAL HISTORY, SURGICAL HISTORY, FAMILY HISTORY, SOCIAL HISTORY AND REVIEW OF SYSTEMS I have reviewed the patient's past medical, surgical, family and social history as well as the comprehensive review of systems as included on the [facility] history form dated 07/28/2013, which I have signed.  Family History  (Detailed)  Relationship Family Member Name Deceased Age at Death Condition Onset Age Cause of Death      Family history of Hypertension  N      Family history of Stroke  N      Family history of Cancer, unknown  N   SOCIAL HISTORY  (Detailed) Tobacco use reviewed. Preferred language is Unknown.   Smoking status: Never smoker.  SMOKING STATUS Use Status Type Smoking Status Usage Per Day Years Used Total Pack Years  no/never  Never smoker              Medications (added, continued or stopped this visit):   Medication Dose Prescribed Else Ind Started Stopped  diazepam 2 mg tablet 2 mg Y    estradiol 0.5 mg tablet 0.5 mg Y    hydrochlorothiazide 12.5 mg tablet 12.5 mg Y    losartan potassium 50mg  ORAL 50mg  Y    meclizine 25 mg tablet 25 mg Y    omeprazole 40 mg capsule,delayed release 40 mg Y    paroxetine 20 mg tablet 20 mg Y    ProAir HFA 90 mcg/actuation aerosol inhaler 90 mcg Y    verapamil ER (SR) 180 mg tablet,extended release 180 mg Y       Allergies:  Ingredient Reaction Medication Name Comment  CODEINE Nausea/Vomiting    New allergies added during this encounter. Active list above.  REVIEW OF SYSTEMS: System Neg/Pos Details  Constitutional Negative Chills, fatigue, fever, malaise, night sweats, weight gain and weight loss.  ENMT Negative Ear drainage, hearing loss, nasal drainage, otalgia, sinus pressure and sore throat.  Eyes Negative Eye discharge, eye pain and vision changes.  Respiratory Negative Chronic cough, cough, dyspnea, known TB exposure and wheezing.  Cardio Positive Heart murmur.  GI Negative Abdominal pain, blood in stool, change in stool pattern, constipation, decreased appetite, diarrhea, heartburn, nausea and vomiting.  GU Negative Dysuria, hematuria, hot flashes, irregular  menses, polyuria, urinary frequency, urinary incontinence and urinary retention.  Endocrine Negative Cold intolerance, heat intolerance, polydipsia and polyphagia.  Neuro Negative Dizziness, extremity weakness, gait disturbance, headache, memory impairment, numbness in extremities, seizures and tremors.  Psych Negative Anxiety, depression and insomnia.  Integumentary Negative Brittle hair, brittle nails, change in shape/size of mole(s), hair loss, hirsutism, hives, pruritus, rash and skin lesion.  MS Positive Neck pain, RUE pain.  Hema/Lymph Negative Easy bleeding, easy bruising and lymphadenopathy.  Allergic/Immuno  Negative Contact allergy, environmental allergies, food allergies and seasonal allergies.  Reproductive Negative Breast discharge, breast lump(s), dysmenorrhea, dyspareunia, history of abnormal PAP smear and vaginal discharge.    Vitals Date Temp F BP Pulse Ht In Wt Lb BMI BSA Pain Score  07/28/2013  137/67 82 67 174.6 27.35  3/10     PHYSICAL EXAM General Level of Distress: no acute distress Overall Appearance: normal  Head and Face  Right Left  Fundoscopic Exam:  normal normal    Cardiovascular Cardiac: regular rate and rhythm without murmur  Right Left  Carotid Pulses: normal normal  Respiratory Lungs: clear to auscultation  Neurological Orientation: normal Recent and Remote Memory: normal Attention Span and Concentration:   normal Language: normal Fund of Knowledge: normal  Right Left Sensation: normal normal Upper Extremity Coordination: normal normal  Lower Extremity Coordination: normal normal  Musculoskeletal Gait and Station: normal  Right Left Upper Extremity Muscle Strength: normal normal Lower Extremity Muscle Strength: normal normal Upper Extremity Muscle Tone:  normal normal Lower Extremity Muscle Tone: normal normal  Motor Strength Upper and lower extremity motor strength was tested in the clinically pertinent muscles .Any abnormal findings will be noted below..   Right Left Triceps: 4-/5   Deep Tendon Reflexes  Right Left Biceps: normal normal Triceps: decrease normal Brachiloradialis: normal normal Patellar: normal normal Achilles: normal normal  Sensory  .Any abnormal findings will be noted below..  Right Left C7: decreased  C8: decreased   Cranial Nerves II. Optic Nerve/Visual Fields: normal III. Oculomotor: normal IV. Trochlear: normal V. Trigeminal: normal VI. Abducens: normal VII. Facial: normal VIII. Acoustic/Vestibular: normal IX. Glossopharyngeal: normal X. Vagus: normal XI. Spinal Accessory: normal XII.  Hypoglossal: normal  Motor and other Tests Lhermittes: negative Rhomberg: negative Pronator drift: absent     Right Left Spurlings positive negative Hoffman's: normal normal Clonus: normal normal Babinski: normal normal   Additional Findings:  Patient has numbness throughout her right hand.  She also has wrist flexion weakness at 4+ out of 5 on the right.    DIAGNOSTIC RESULTS An outside MRI was reviewed which shows multilevel multifactorial spondylosis most severe at the C6-C7 level on the right with severe nerve root compression and possible nerve root compression on the left.  Surgical consultation was recommended.  There is some disc degeneration at the C5-C6 level and also some foraminal stenosis at C4-C5 on the left but none of these levels are nearly as severe as the C6-C7 level.  Plain radiographs were reviewed which show significant foraminal stenosis at C6-C7 on the right with spondylosis.  There is also disc degeneration at C3-C4 as well.    IMPRESSION Patient has a right C7 radiculopathy with significant spondylosis and foraminal stenosis at the C6-C7 level on the right.  I do not believe that the other levels in the cervical spine or severely affected.  I have recommended that she undergo anterior cervical decompression and fusion at the C6-C7 level.  Assessment/Plan # Detail Type Description   1. Assessment Pain/neck (  723.1).       2. 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       3. Assessment Cervical disk displacement w/o myelopathy (722.0).       4. Assessment Cervical disc disorder w/ radiculopathy (723.4).        Pain Assessment/Treatment Pain Scale: 3/10. Method: Numeric Pain Intensity Scale. Location: neck. Pain Assessment/Treatment follow-up plan of care: Patient using heat.  The patient wishes to proceed with surgery.  She was fitted for a soft cervical collar.  Surgery has been scheduled.  Risks and benefits were discussed in  detail with the patient and she wishes to proceed.  Orders: Diagnostic Procedures: Assessment Procedure  723.4 ACDF - C6-C7             Provider:  Danae Orleans. Venetia Maxon  07/29/2013 05:42 PM Dictation edited by: Danae Orleans. Venetia Maxon    CC Providers: Julieanne Manson [facility] [place], [place] [address] ----------------------------------------------------------------------------------------------------------------------------------------------------------------------         Electronically signed by Danae Orleans. Venetia Maxon on 07/29/2013 05:42 PM

## 2013-08-26 ENCOUNTER — Ambulatory Visit (HOSPITAL_COMMUNITY): Payer: Medicare Other

## 2013-08-26 ENCOUNTER — Encounter (HOSPITAL_COMMUNITY): Payer: Self-pay | Admitting: *Deleted

## 2013-08-26 ENCOUNTER — Encounter (HOSPITAL_COMMUNITY)
Admission: RE | Disposition: A | Discharge status: Home / Self Care | Payer: Self-pay | Source: Ambulatory Visit | Attending: Neurosurgery

## 2013-08-26 ENCOUNTER — Inpatient Hospital Stay (HOSPITAL_COMMUNITY)
Admission: RE | Admit: 2013-08-26 | Discharge: 2013-08-27 | DRG: 473 | Disposition: A | Discharge status: Home / Self Care | Payer: Medicare Other | Source: Ambulatory Visit | Attending: Neurosurgery | Admitting: Neurosurgery

## 2013-08-26 ENCOUNTER — Ambulatory Visit (HOSPITAL_COMMUNITY): Payer: Medicare Other | Admitting: Certified Registered"

## 2013-08-26 ENCOUNTER — Encounter (HOSPITAL_COMMUNITY): Payer: Medicare Other | Admitting: Certified Registered"

## 2013-08-26 DIAGNOSIS — M47812 Spondylosis without myelopathy or radiculopathy, cervical region: Principal | ICD-10-CM | POA: Diagnosis present

## 2013-08-26 HISTORY — PX: ANTERIOR CERVICAL DECOMP/DISCECTOMY FUSION: SHX1161

## 2013-08-26 SURGERY — ANTERIOR CERVICAL DECOMPRESSION/DISCECTOMY FUSION 1 LEVEL
Anesthesia: General

## 2013-08-26 MED ORDER — DOCUSATE SODIUM 100 MG PO CAPS: 100.0000 mg | ORAL_CAPSULE | Freq: Two times a day (BID) | ORAL | Status: DC

## 2013-08-26 MED ORDER — POLYETHYLENE GLYCOL 3350 17 G PO PACK
17.0000 g | PACK | Freq: Every day | ORAL | Status: DC | PRN
  Filled 2013-08-26: qty 1

## 2013-08-26 MED ORDER — BISACODYL 10 MG RE SUPP: 10.0000 mg | Freq: Every day | RECTAL | Status: DC | PRN

## 2013-08-26 MED ORDER — OXYCODONE-ACETAMINOPHEN 5-325 MG PO TABS
1.0000 | ORAL_TABLET | ORAL | Status: DC | PRN
  Administered 2013-08-26 – 2013-08-27 (×2): 2 via ORAL
  Filled 2013-08-26 (×2): qty 2

## 2013-08-26 MED ORDER — MUPIROCIN 2 % EX OINT: 1.0000 "application " | TOPICAL_OINTMENT | Freq: Two times a day (BID) | CUTANEOUS | Status: DC

## 2013-08-26 MED ORDER — HEMOSTATIC AGENTS (NO CHARGE) OPTIME
TOPICAL | Status: DC | PRN
  Administered 2013-08-26: 1 via TOPICAL

## 2013-08-26 MED ORDER — ONDANSETRON HCL 4 MG/2ML IJ SOLN
4.0000 mg | INTRAMUSCULAR | Status: DC | PRN
  Administered 2013-08-26: 4 mg via INTRAVENOUS
  Filled 2013-08-26: qty 2

## 2013-08-26 MED ORDER — HYDROCODONE-ACETAMINOPHEN 5-325 MG PO TABS: 1.0000 | ORAL_TABLET | ORAL | Status: DC | PRN

## 2013-08-26 MED ORDER — VANCOMYCIN HCL IN DEXTROSE 1-5 GM/200ML-% IV SOLN
INTRAVENOUS | Status: AC
  Filled 2013-08-26: qty 200

## 2013-08-26 MED ORDER — HYDROMORPHONE HCL PF 1 MG/ML IJ SOLN
INTRAMUSCULAR | Status: AC
  Filled 2013-08-26: qty 1

## 2013-08-26 MED ORDER — LIDOCAINE-EPINEPHRINE 1 %-1:100000 IJ SOLN
INTRAMUSCULAR | Status: DC | PRN
  Administered 2013-08-26: 7 mL

## 2013-08-26 MED ORDER — MORPHINE SULFATE 2 MG/ML IJ SOLN
1.0000 mg | INTRAMUSCULAR | Status: DC | PRN
  Administered 2013-08-26: 2 mg via INTRAVENOUS
  Filled 2013-08-26: qty 1

## 2013-08-26 MED ORDER — MECLIZINE HCL 25 MG PO TABS
25.0000 mg | ORAL_TABLET | Freq: Every day | ORAL | Status: DC | PRN
  Filled 2013-08-26: qty 1

## 2013-08-26 MED ORDER — DEXAMETHASONE SODIUM PHOSPHATE 10 MG/ML IJ SOLN
INTRAMUSCULAR | Status: AC
  Administered 2013-08-26: 10 mg via INTRAVENOUS
  Filled 2013-08-26: qty 1

## 2013-08-26 MED ORDER — ESTRADIOL 1 MG PO TABS
0.5000 mg | ORAL_TABLET | Freq: Every day | ORAL | Status: DC
Start: 2013-08-26 — End: 2013-08-27
  Filled 2013-08-26 (×2): qty 0.5

## 2013-08-26 MED ORDER — FENTANYL CITRATE 0.05 MG/ML IJ SOLN
INTRAMUSCULAR | Status: DC | PRN
  Administered 2013-08-26 (×2): 50 ug via INTRAVENOUS
  Administered 2013-08-26: 100 ug via INTRAVENOUS
  Administered 2013-08-26: 50 ug via INTRAVENOUS

## 2013-08-26 MED ORDER — SENNA 8.6 MG PO TABS
1.0000 | ORAL_TABLET | Freq: Two times a day (BID) | ORAL | Status: DC
  Administered 2013-08-26: 8.6 mg via ORAL
  Filled 2013-08-26 (×3): qty 1

## 2013-08-26 MED ORDER — SODIUM CHLORIDE 0.9 % IJ SOLN
3.0000 mL | Freq: Two times a day (BID) | INTRAMUSCULAR | Status: DC
  Administered 2013-08-26: 3 mL via INTRAVENOUS

## 2013-08-26 MED ORDER — PROPOFOL INFUSION 10 MG/ML OPTIME
INTRAVENOUS | Status: DC | PRN
  Administered 2013-08-26: 100 ug/kg/min via INTRAVENOUS

## 2013-08-26 MED ORDER — DOCUSATE SODIUM 100 MG PO CAPS
100.0000 mg | ORAL_CAPSULE | Freq: Two times a day (BID) | ORAL | Status: DC
  Administered 2013-08-26: 100 mg via ORAL
  Filled 2013-08-26 (×3): qty 1

## 2013-08-26 MED ORDER — MIDAZOLAM HCL 5 MG/5ML IJ SOLN
INTRAMUSCULAR | Status: DC | PRN
  Administered 2013-08-26: 2 mg via INTRAVENOUS

## 2013-08-26 MED ORDER — SCOPOLAMINE 1 MG/3DAYS TD PT72
MEDICATED_PATCH | TRANSDERMAL | Status: AC
  Administered 2013-08-26: 1 via TRANSDERMAL
  Filled 2013-08-26: qty 1

## 2013-08-26 MED ORDER — FLEET ENEMA 7-19 GM/118ML RE ENEM: 1.0000 | ENEMA | Freq: Once | RECTAL | Status: AC | PRN

## 2013-08-26 MED ORDER — ONDANSETRON HCL 4 MG/2ML IJ SOLN: 4.0000 mg | Freq: Four times a day (QID) | INTRAMUSCULAR | Status: DC | PRN

## 2013-08-26 MED ORDER — ONDANSETRON HCL 4 MG/2ML IJ SOLN
INTRAMUSCULAR | Status: DC | PRN
  Administered 2013-08-26: 4 mg via INTRAVENOUS

## 2013-08-26 MED ORDER — SODIUM CHLORIDE 0.9 % IV SOLN: 250.0000 mL | INTRAVENOUS | Status: DC

## 2013-08-26 MED ORDER — PAROXETINE HCL 20 MG PO TABS
20.0000 mg | ORAL_TABLET | ORAL | Status: DC
  Administered 2013-08-27: 20 mg via ORAL
  Filled 2013-08-26 (×2): qty 1

## 2013-08-26 MED ORDER — HYDROMORPHONE HCL PF 1 MG/ML IJ SOLN
0.2500 mg | INTRAMUSCULAR | Status: DC | PRN
  Administered 2013-08-26 (×2): 0.5 mg via INTRAVENOUS

## 2013-08-26 MED ORDER — ROCURONIUM BROMIDE 100 MG/10ML IV SOLN
INTRAVENOUS | Status: DC | PRN
  Administered 2013-08-26: 50 mg via INTRAVENOUS

## 2013-08-26 MED ORDER — METHOCARBAMOL 100 MG/ML IJ SOLN
500.0000 mg | Freq: Four times a day (QID) | INTRAVENOUS | Status: DC | PRN
  Filled 2013-08-26: qty 5

## 2013-08-26 MED ORDER — LACTATED RINGERS IV SOLN
INTRAVENOUS | Status: DC
  Administered 2013-08-26: 12:00:00 via INTRAVENOUS

## 2013-08-26 MED ORDER — BUPIVACAINE HCL (PF) 0.5 % IJ SOLN
INTRAMUSCULAR | Status: DC | PRN
  Administered 2013-08-26: 7 mL

## 2013-08-26 MED ORDER — PHENOL 1.4 % MT LIQD: 1.0000 | OROMUCOSAL | Status: DC | PRN

## 2013-08-26 MED ORDER — OXYCODONE HCL 5 MG/5ML PO SOLN: 5.0000 mg | Freq: Once | ORAL | Status: DC | PRN

## 2013-08-26 MED ORDER — 0.9 % SODIUM CHLORIDE (POUR BTL) OPTIME
TOPICAL | Status: DC | PRN
  Administered 2013-08-26: 1000 mL

## 2013-08-26 MED ORDER — VERAPAMIL HCL 180 MG (CO) PO TB24
180.0000 mg | ORAL_TABLET | Freq: Two times a day (BID) | ORAL | Status: DC
Start: 2013-08-26 — End: 2013-08-27
  Administered 2013-08-26: 180 mg via ORAL
  Filled 2013-08-26 (×3): qty 1

## 2013-08-26 MED ORDER — ARTIFICIAL TEARS OP OINT
TOPICAL_OINTMENT | OPHTHALMIC | Status: DC | PRN
  Administered 2013-08-26: 1 via OPHTHALMIC

## 2013-08-26 MED ORDER — DIAZEPAM 2 MG PO TABS
2.0000 mg | ORAL_TABLET | Freq: Four times a day (QID) | ORAL | Status: DC | PRN
  Filled 2013-08-26: qty 1

## 2013-08-26 MED ORDER — ACETAMINOPHEN 650 MG RE SUPP: 650.0000 mg | RECTAL | Status: DC | PRN

## 2013-08-26 MED ORDER — MENTHOL 3 MG MT LOZG: 1.0000 | LOZENGE | OROMUCOSAL | Status: DC | PRN

## 2013-08-26 MED ORDER — LOSARTAN POTASSIUM 50 MG PO TABS
50.0000 mg | ORAL_TABLET | Freq: Every day | ORAL | Status: DC
  Filled 2013-08-26 (×2): qty 1

## 2013-08-26 MED ORDER — METHOCARBAMOL 500 MG PO TABS
500.0000 mg | ORAL_TABLET | Freq: Four times a day (QID) | ORAL | Status: DC | PRN
  Administered 2013-08-26: 500 mg via ORAL
  Filled 2013-08-26: qty 1

## 2013-08-26 MED ORDER — ALBUTEROL SULFATE HFA 108 (90 BASE) MCG/ACT IN AERS
2.0000 | INHALATION_SPRAY | Freq: Four times a day (QID) | RESPIRATORY_TRACT | Status: DC | PRN
  Filled 2013-08-26: qty 6.7

## 2013-08-26 MED ORDER — LIDOCAINE HCL (CARDIAC) 20 MG/ML IV SOLN
INTRAVENOUS | Status: DC | PRN
  Administered 2013-08-26: 80 mg via INTRAVENOUS

## 2013-08-26 MED ORDER — PANTOPRAZOLE SODIUM 40 MG PO TBEC
80.0000 mg | DELAYED_RELEASE_TABLET | Freq: Every day | ORAL | Status: DC
  Filled 2013-08-26: qty 2

## 2013-08-26 MED ORDER — ZOLPIDEM TARTRATE 5 MG PO TABS: 5.0000 mg | ORAL_TABLET | Freq: Every evening | ORAL | Status: DC | PRN

## 2013-08-26 MED ORDER — NEOSTIGMINE METHYLSULFATE 1 MG/ML IJ SOLN
INTRAMUSCULAR | Status: DC | PRN
  Administered 2013-08-26: 4 mg via INTRAVENOUS

## 2013-08-26 MED ORDER — SODIUM CHLORIDE 0.9 % IJ SOLN: 3.0000 mL | INTRAMUSCULAR | Status: DC | PRN

## 2013-08-26 MED ORDER — ADULT MULTIVITAMIN W/MINERALS CH
1.0000 | ORAL_TABLET | Freq: Every day | ORAL | Status: DC
  Filled 2013-08-26 (×2): qty 1

## 2013-08-26 MED ORDER — GLYCOPYRROLATE 0.2 MG/ML IJ SOLN
INTRAMUSCULAR | Status: DC | PRN
  Administered 2013-08-26: 0.6 mg via INTRAVENOUS

## 2013-08-26 MED ORDER — THROMBIN 5000 UNITS EX SOLR
CUTANEOUS | Status: DC | PRN
  Administered 2013-08-26 (×2): 5000 [IU] via TOPICAL

## 2013-08-26 MED ORDER — PROPOFOL 10 MG/ML IV BOLUS
INTRAVENOUS | Status: DC | PRN
  Administered 2013-08-26 (×6): 50 mg via INTRAVENOUS
  Administered 2013-08-26: 100 mg via INTRAVENOUS

## 2013-08-26 MED ORDER — ALUM & MAG HYDROXIDE-SIMETH 200-200-20 MG/5ML PO SUSP: 30.0000 mL | Freq: Four times a day (QID) | ORAL | Status: DC | PRN

## 2013-08-26 MED ORDER — ACETAMINOPHEN 325 MG PO TABS: 650.0000 mg | ORAL_TABLET | ORAL | Status: DC | PRN

## 2013-08-26 MED ORDER — FLUTICASONE PROPIONATE 50 MCG/ACT NA SUSP
1.0000 | Freq: Every day | NASAL | Status: DC | PRN
  Filled 2013-08-26: qty 16

## 2013-08-26 MED ORDER — OXYCODONE HCL 5 MG PO TABS: 5.0000 mg | ORAL_TABLET | Freq: Once | ORAL | Status: DC | PRN

## 2013-08-26 MED ORDER — CHLORHEXIDINE GLUCONATE CLOTH 2 % EX PADS
6.0000 | MEDICATED_PAD | Freq: Every day | CUTANEOUS | Status: DC
  Administered 2013-08-27: 6 via TOPICAL

## 2013-08-26 MED ORDER — HYDROCHLOROTHIAZIDE 25 MG PO TABS
25.0000 mg | ORAL_TABLET | Freq: Every day | ORAL | Status: DC
  Filled 2013-08-26 (×2): qty 1

## 2013-08-26 MED ORDER — KCL IN DEXTROSE-NACL 20-5-0.45 MEQ/L-%-% IV SOLN
INTRAVENOUS | Status: DC
  Filled 2013-08-26 (×3): qty 1000

## 2013-08-26 MED ORDER — VECURONIUM BROMIDE 10 MG IV SOLR
INTRAVENOUS | Status: DC | PRN
  Administered 2013-08-26: 3 mg via INTRAVENOUS

## 2013-08-26 SURGICAL SUPPLY — 69 items
BAG DECANTER FOR FLEXI CONT (MISCELLANEOUS) IMPLANT
BANDAGE GAUZE ELAST BULKY 4 IN (GAUZE/BANDAGES/DRESSINGS) ×4 IMPLANT
BENZOIN TINCTURE PRP APPL 2/3 (GAUZE/BANDAGES/DRESSINGS) IMPLANT
BIT DRILL 2.3 12 FIXED (INSTRUMENTS) ×1 IMPLANT
BIT DRILL NEURO 2X3.1 SFT TUCH (MISCELLANEOUS) ×1 IMPLANT
BLADE ULTRA TIP 2M (BLADE) ×2 IMPLANT
BUR BARREL STRAIGHT FLUTE 4.0 (BURR) ×2 IMPLANT
CANISTER SUCT 3000ML (MISCELLANEOUS) ×2 IMPLANT
CONT SPEC 4OZ CLIKSEAL STRL BL (MISCELLANEOUS) ×2 IMPLANT
COVER MAYO STAND STRL (DRAPES) ×2 IMPLANT
DERMABOND ADVANCED (GAUZE/BANDAGES/DRESSINGS) ×1
DERMABOND ADVANCED .7 DNX12 (GAUZE/BANDAGES/DRESSINGS) ×1 IMPLANT
DRAPE LAPAROTOMY 100X72 PEDS (DRAPES) ×2 IMPLANT
DRAPE MICROSCOPE LEICA (MISCELLANEOUS) ×2 IMPLANT
DRAPE POUCH INSTRU U-SHP 10X18 (DRAPES) ×2 IMPLANT
DRAPE PROXIMA HALF (DRAPES) IMPLANT
DRESSING TELFA 8X3 (GAUZE/BANDAGES/DRESSINGS) IMPLANT
DRILL 12MM (INSTRUMENTS) ×2
DRILL NEURO 2X3.1 SOFT TOUCH (MISCELLANEOUS) ×2
DURAPREP 6ML APPLICATOR 50/CS (WOUND CARE) ×2 IMPLANT
ELECT COATED BLADE 2.86 ST (ELECTRODE) ×2 IMPLANT
ELECT REM PT RETURN 9FT ADLT (ELECTROSURGICAL) ×2
ELECTRODE REM PT RTRN 9FT ADLT (ELECTROSURGICAL) ×1 IMPLANT
GAUZE SPONGE 4X4 16PLY XRAY LF (GAUZE/BANDAGES/DRESSINGS) IMPLANT
GLOVE BIO SURGEON STRL SZ 6.5 (GLOVE) ×4 IMPLANT
GLOVE BIO SURGEON STRL SZ8 (GLOVE) ×2 IMPLANT
GLOVE BIOGEL PI IND STRL 6.5 (GLOVE) ×1 IMPLANT
GLOVE BIOGEL PI IND STRL 7.5 (GLOVE) ×1 IMPLANT
GLOVE BIOGEL PI IND STRL 8 (GLOVE) ×1 IMPLANT
GLOVE BIOGEL PI IND STRL 8.5 (GLOVE) ×1 IMPLANT
GLOVE BIOGEL PI INDICATOR 6.5 (GLOVE) ×1
GLOVE BIOGEL PI INDICATOR 7.5 (GLOVE) ×1
GLOVE BIOGEL PI INDICATOR 8 (GLOVE) ×1
GLOVE BIOGEL PI INDICATOR 8.5 (GLOVE) ×1
GLOVE ECLIPSE 8.0 STRL XLNG CF (GLOVE) ×2 IMPLANT
GLOVE EXAM NITRILE LRG STRL (GLOVE) IMPLANT
GLOVE EXAM NITRILE MD LF STRL (GLOVE) ×2 IMPLANT
GLOVE EXAM NITRILE XL STR (GLOVE) IMPLANT
GLOVE EXAM NITRILE XS STR PU (GLOVE) IMPLANT
GLOVE SURG SS PI 7.0 STRL IVOR (GLOVE) ×4 IMPLANT
GOWN BRE IMP SLV AUR LG STRL (GOWN DISPOSABLE) ×2 IMPLANT
GOWN BRE IMP SLV AUR XL STRL (GOWN DISPOSABLE) ×4 IMPLANT
GOWN STRL REIN 2XL LVL4 (GOWN DISPOSABLE) ×2 IMPLANT
HEAD HALTER (SOFTGOODS) ×2 IMPLANT
KIT BASIN OR (CUSTOM PROCEDURE TRAY) ×2 IMPLANT
KIT ROOM TURNOVER OR (KITS) ×2 IMPLANT
NEEDLE HYPO 18GX1.5 BLUNT FILL (NEEDLE) IMPLANT
NEEDLE HYPO 25X1 1.5 SAFETY (NEEDLE) ×2 IMPLANT
NEEDLE SPNL 22GX3.5 QUINCKE BK (NEEDLE) ×4 IMPLANT
NS IRRIG 1000ML POUR BTL (IV SOLUTION) ×2 IMPLANT
PACK LAMINECTOMY NEURO (CUSTOM PROCEDURE TRAY) ×2 IMPLANT
PAD ARMBOARD 7.5X6 YLW CONV (MISCELLANEOUS) ×6 IMPLANT
PIN DISTRACTION 14MM (PIN) ×4 IMPLANT
PLATE 14MM (Plate) ×2 IMPLANT
RUBBERBAND STERILE (MISCELLANEOUS) ×4 IMPLANT
SCREW 12MM (Screw) ×8 IMPLANT
SPACER ASSEM CERV LORD 7M (Spacer) ×2 IMPLANT
SPONGE GAUZE 4X4 12PLY (GAUZE/BANDAGES/DRESSINGS) IMPLANT
SPONGE INTESTINAL PEANUT (DISPOSABLE) ×2 IMPLANT
SPONGE SURGIFOAM ABS GEL SZ50 (HEMOSTASIS) ×2 IMPLANT
STAPLER SKIN PROX WIDE 3.9 (STAPLE) IMPLANT
STRIP CLOSURE SKIN 1/2X4 (GAUZE/BANDAGES/DRESSINGS) IMPLANT
SUT VIC AB 3-0 SH 8-18 (SUTURE) ×2 IMPLANT
SYR 20ML ECCENTRIC (SYRINGE) ×2 IMPLANT
SYR 3ML LL SCALE MARK (SYRINGE) IMPLANT
TOWEL OR 17X24 6PK STRL BLUE (TOWEL DISPOSABLE) ×2 IMPLANT
TOWEL OR 17X26 10 PK STRL BLUE (TOWEL DISPOSABLE) ×2 IMPLANT
TRAP SPECIMEN MUCOUS 40CC (MISCELLANEOUS) ×2 IMPLANT
WATER STERILE IRR 1000ML POUR (IV SOLUTION) ×2 IMPLANT

## 2013-08-26 NOTE — Progress Notes (Signed)
All Charting per Sharlot Gowda RN

## 2013-08-26 NOTE — Anesthesia Preprocedure Evaluation (Addendum)
Anesthesia Evaluation  Patient identified by MRN, date of birth, ID band Patient awake    Reviewed: Allergy & Precautions, H&P , NPO status , Patient's Chart, lab work & pertinent test results  History of Anesthesia Complications (+) PONV and history of anesthetic complications  Airway Mallampati: II  Neck ROM: full    Dental  (+) Teeth Intact, Partial Lower and Dental Advisory Given   Pulmonary shortness of breath and with exertion,          Cardiovascular hypertension, Pt. on medications + Valvular Problems/Murmurs Rhythm:Regular Rate:Normal     Neuro/Psych Depression    GI/Hepatic GERD-  Medicated and Controlled,  Endo/Other    Renal/GU      Musculoskeletal   Abdominal   Peds  Hematology   Anesthesia Other Findings   Reproductive/Obstetrics                          Anesthesia Physical Anesthesia Plan  ASA: II  Anesthesia Plan: General   Post-op Pain Management:    Induction: Intravenous  Airway Management Planned: Oral ETT  Additional Equipment:   Intra-op Plan:   Post-operative Plan: Extubation in OR  Informed Consent: I have reviewed the patients History and Physical, chart, labs and discussed the procedure including the risks, benefits and alternatives for the proposed anesthesia with the patient or authorized representative who has indicated his/her understanding and acceptance.     Plan Discussed with: CRNA, Anesthesiologist and Surgeon  Anesthesia Plan Comments:         Anesthesia Quick Evaluation

## 2013-08-26 NOTE — Preoperative (Signed)
Beta Blockers   Reason not to administer Beta Blockers:Not Applicable 

## 2013-08-26 NOTE — Transfer of Care (Signed)
Immediate Anesthesia Transfer of Care Note  [name]  Procedure(s) Performed: Procedure(s) with comments: CERVICAL SIX TO SEVEN ANTERIOR CERVICAL DECOMPRESSION/DISCECTOMY FUSION 1 LEVEL (N/A) - C6-7 Anterior cervical decompression/diskectomy/fusion  Patient Location: PACU  Anesthesia Type:General  Level of Consciousness: lethargic and responds to stimulation  Airway & Oxygen Therapy: Patient Spontanous Breathing and Patient connected to nasal cannula oxygen  Post-op Assessment: Report given to PACU RN  Post vital signs: Reviewed and stable  Complications: No apparent anesthesia complications

## 2013-08-26 NOTE — Brief Op Note (Signed)
08/26/2013  5:15 PM  PATIENT:  Jessica Armstrong  67 y.o. 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, cervicalgia C 67  PROCEDURE:  Procedure(s) with comments: CERVICAL SIX TO SEVEN ANTERIOR CERVICAL DECOMPRESSION/DISCECTOMY FUSION 1 LEVEL (N/A) - C6-7 Anterior cervical decompression/diskectomy/fusion with allograft/autograft, anterior cervical plate  SURGEON:  Surgeon(s) and Role:    * Maeola Harman, MD - Primary    * Reinaldo Meeker, MD - Assisting  PHYSICIAN ASSISTANT:   ASSISTANTS: Poteat, RN   ANESTHESIA:   general  EBL:  Total I/O In: 800 [I.V.:800] Out: -   BLOOD ADMINISTERED:none  DRAINS: none   LOCAL MEDICATIONS USED:  LIDOCAINE   SPECIMEN:  No Specimen  DISPOSITION OF SPECIMEN:  N/A  COUNTS:  YES  TOURNIQUET:  * No tourniquets in log *  DICTATION: DICTATION: Patient is 67 year old female with cervical disc herniation C 67 with spondylosis and radiculopathy.  It was elected to take her to surgery for anterior cervical decompression and fusion C 67 level.  PROCEDURE: Patient was brought to operating room and following the smooth and uncomplicated induction of general endotracheal anesthesia her head was placed on a horseshoe head holder she was placed in 5 pounds of Holter traction and her anterior neck was prepped and draped in usual sterile fashion. An incision was made on the left side of midline after infiltrating the skin and subcutaneous tissues with local lidocaine. The platysmal layer was incised and subplatysmal dissection was performed exposing the anterior border sternocleidomastoid muscle. Using blunt dissection the carotid sheath was kept lateral and trachea and esophagus kept medial exposing the anterior cervical spine. A  bent spinal needle was placed it was felt to be the C 67 level and this was poorly visualized, so an additional needle was placed at C 56 and this level was confirmed on intraoperative x-ray. Longus coli muscles were taken down from the anterior cervical spine using electrocautery and key elevator and self-retaining retractor was placed exposing the C 67 level. The interspace was incised and a thorough discectomy was performed. Distraction pins were placed. Uncinate spurs and central spondylitic ridges were drilled down with a high-speed drill. The spinal cord dura and both C7 nerve roots were widely decompressed, particularly on the right, which was the symptomatic side. Hemostasis was assured. After trial sizing a 7 mm lordotic allograft bone wedge was selected and packed with local autograft. This was tamped into position and countersunk appropriately. Distraction weight was removed. A 14 mm trestle luxe anterior cervical plate was affixed to the cervical spine with 12 mm variable-angle screws 2 at C6, 2 at C7. All screws were well-positioned and locking mechanisms were engaged. A final X ray was obtained which showed well positioned superior aspect of the anterior plate without complicating features. Soft tissues were inspected and found to be in good repair. The wound was irrigated. The platysma layer was closed with 3-0 Vicryl stitches and the skin was reapproximated with 3-0 Vicryl subcuticular stitches. The wound was dressed with Dermabond. Counts were correct at the end of the case. Patient was extubated and taken to recovery in stable and satisfactory condition.   PLAN OF CARE: Admit to inpatient   PATIENT DISPOSITION:  PACU - hemodynamically stable.   Delay start of Pharmacological VTE agent (>24hrs) due to surgical blood loss or risk of bleeding: yes

## 2013-08-26 NOTE — Progress Notes (Signed)
Awake, alert, conversant.  Full strength both upper extremities.  Improved triceps strength on the right.  Doing well.

## 2013-08-26 NOTE — Anesthesia Postprocedure Evaluation (Signed)
Anesthesia Post Note  [name]  Procedure(s) Performed: Procedure(s) (LRB): CERVICAL SIX TO SEVEN ANTERIOR CERVICAL DECOMPRESSION/DISCECTOMY FUSION 1 LEVEL (N/A)  Anesthesia type: General  Patient location: PACU  Post pain: Pain level controlled and Adequate analgesia  Post assessment: Post-op Vital signs reviewed, Patient's Cardiovascular Status Stable, Respiratory Function Stable, Patent Airway and Pain level controlled  Last Vitals:  Filed Vitals:   08/26/13 1730  BP: 142/71  Pulse: 78  Temp:   Resp: 13    Post vital signs: Reviewed and stable  Level of consciousness: awake, alert  and oriented  Complications: No apparent anesthesia complications

## 2013-08-26 NOTE — Interval H&P Note (Signed)
History and Physical Interval Note:  08/26/2013 7:41 AM  Jessica Armstrong  has presented today for surgery, with the diagnosis of Cervical radiculopathy, spondylosis without myelopathy, HNP without myelopathy  The various methods of treatment have been discussed with the patient and family. After consideration of risks, benefits and other options for treatment, the patient has consented to  Procedure(s) with comments: ANTERIOR CERVICAL DECOMPRESSION/DISCECTOMY FUSION 1 LEVEL (N/A) - C6-7 Anterior cervical decompression/diskectomy/fusion as a surgical intervention .  The patient's history has been reviewed, patient examined, no change in status, stable for surgery.  I have reviewed the patient's chart and labs.  Questions were answered to the patient's satisfaction.     Dinari Stgermaine D

## 2013-08-26 NOTE — Op Note (Signed)
08/26/2013  5:15 PM  PATIENT:  Jessica Armstrong  67 y.o. 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, cervicalgia C 67  PROCEDURE:  Procedure(s) with comments: CERVICAL SIX TO SEVEN ANTERIOR CERVICAL DECOMPRESSION/DISCECTOMY FUSION 1 LEVEL (N/A) - C6-7 Anterior cervical decompression/diskectomy/fusion with allograft/autograft, anterior cervical plate  SURGEON:  Surgeon(s) and Role:    * Khameron Gruenwald, MD - Primary    * Randy O Kritzer, MD - Assisting  PHYSICIAN ASSISTANT:   ASSISTANTS: Poteat, RN   ANESTHESIA:   general  EBL:  Total I/O In: 800 [I.V.:800] Out: -   BLOOD ADMINISTERED:none  DRAINS: none   LOCAL MEDICATIONS USED:  LIDOCAINE   SPECIMEN:  No Specimen  DISPOSITION OF SPECIMEN:  N/A  COUNTS:  YES  TOURNIQUET:  * No tourniquets in log *  DICTATION: DICTATION: Patient is 67 year old female with cervical disc herniation C 67 with spondylosis and radiculopathy.  It was elected to take her to surgery for anterior cervical decompression and fusion C 67 level.  PROCEDURE: Patient was brought to operating room and following the smooth and uncomplicated induction of general endotracheal anesthesia her head was placed on a horseshoe head holder she was placed in 5 pounds of Holter traction and her anterior neck was prepped and draped in usual sterile fashion. An incision was made on the left side of midline after infiltrating the skin and subcutaneous tissues with local lidocaine. The platysmal layer was incised and subplatysmal dissection was performed exposing the anterior border sternocleidomastoid muscle. Using blunt dissection the carotid sheath was kept lateral and trachea and esophagus kept medial exposing the anterior cervical spine. A  bent spinal needle was placed it was felt to be the C 67 level and this was poorly visualized, so an additional needle was placed at C 56 and this level was confirmed on intraoperative x-ray. Longus coli muscles were taken down from the anterior cervical spine using electrocautery and key elevator and self-retaining retractor was placed exposing the C 67 level. The interspace was incised and a thorough discectomy was performed. Distraction pins were placed. Uncinate spurs and central spondylitic ridges were drilled down with a high-speed drill. The spinal cord dura and both C7 nerve roots were widely decompressed, particularly on the right, which was the symptomatic side. Hemostasis was assured. After trial sizing a 7 mm lordotic allograft bone wedge was selected and packed with local autograft. This was tamped into position and countersunk appropriately. Distraction weight was removed. A 14 mm trestle luxe anterior cervical plate was affixed to the cervical spine with 12 mm variable-angle screws 2 at C6, 2 at C7. All screws were well-positioned and locking mechanisms were engaged. A final X ray was obtained which showed well positioned superior aspect of the anterior plate without complicating features. Soft tissues were inspected and found to be in good repair. The wound was irrigated. The platysma layer was closed with 3-0 Vicryl stitches and the skin was reapproximated with 3-0 Vicryl subcuticular stitches. The wound was dressed with Dermabond. Counts were correct at the end of the case. Patient was extubated and taken to recovery in stable and satisfactory condition.   PLAN OF CARE: Admit to inpatient   PATIENT DISPOSITION:  PACU - hemodynamically stable.   Delay start of Pharmacological VTE agent (>24hrs) due to surgical blood loss or risk of bleeding: yes  

## 2013-08-27 MED ORDER — METHOCARBAMOL 500 MG PO TABS: 500.0000 mg | ORAL_TABLET | Freq: Four times a day (QID) | ORAL | Status: DC | PRN

## 2013-08-27 MED ORDER — OXYCODONE-ACETAMINOPHEN 5-325 MG PO TABS: 1.0000 | ORAL_TABLET | ORAL | Status: DC | PRN

## 2013-08-27 MED FILL — Verapamil HCl Tab ER 180 MG: ORAL | Qty: 1 | Status: AC

## 2013-08-27 NOTE — Progress Notes (Signed)
Subjective: Patient reports feeling good  Objective: Vital signs in last 24 hours: Temp:  [97 F (36.1 C)-98.4 F (36.9 C)] 98.4 F (36.9 C) (11/19 0346) Pulse Rate:  [66-87] 77 (11/19 0346) Resp:  [13-21] 18 (11/19 0346) BP: (133-169)/(59-81) 162/74 mmHg (11/19 0346) SpO2:  [93 %-98 %] 97 % (11/19 0346)  Intake/Output from previous day: 11/18 0701 - 11/19 0700 In: 800 [I.V.:800] Out: -  Intake/Output this shift:    Physical Exam: Strength full both upper extremities with improved right triceps strength.  Dressing CDI.  Lab Results: No results found for this basename: WBC, HGB, HCT, PLT,  in the last 72 hours BMET No results found for this basename: NA, K, CL, CO2, GLUCOSE, BUN, CREATININE, CALCIUM,  in the last 72 hours  Studies/Results: Dg Cervical Spine 2-3 Views  08/26/2013   CLINICAL DATA:  Cervical disc disease.  EXAM: CERVICAL SPINE - 2-3 VIEW  COMPARISON:  Radiographs dated 07/28/2013  FINDINGS: Radiograph 1 demonstrates a needle at the C6-7 level. Radiograph 2 demonstrates needles at C5-6 and C6-7. Radiograph 3 demonstrates anterior cervical fusion at C6-7. The hardware is faintly visible.  IMPRESSION: Anterior cervical fusion performed at C6-7.   Electronically Signed   By: Geanie Cooley M.D.   On: 08/26/2013 18:05    Assessment/Plan: Doing well.  D/C home.    LOS: 1 day    Dorian Heckle, MD 08/27/2013, 8:15 AM

## 2013-08-27 NOTE — Discharge Summary (Signed)
Physician Discharge Summary  Patient ID: Jessica Armstrong MRN: [id number] DOB/AGE: August 05, 1946 67 y.o.  Admit date: 08/26/2013 Discharge date: 08/27/2013  Admission Diagnoses:Cervical spondylosis with radiculopathy C 67  Discharge Diagnoses: Cervical spondylosis with radiculopathy C 67 Active Problems:   * No active hospital problems. *   Discharged Condition: good  Hospital Course: Uncomplicated anterior cervical decompression and fusion C 67  Consults: None  Significant Diagnostic Studies: None  Treatments: surgery: anterior cervical decompression and fusion C 67   Discharge Exam: Blood pressure 162/74, pulse 77, temperature 98.4 F (36.9 C), temperature source Oral, resp. rate 18, SpO2 97.00%. Neurologic: Alert and oriented X 3, normal strength and tone. Normal symmetric reflexes. Normal coordination and gait Wound:CDI  Disposition: Home     Medication List         diazepam 2 MG tablet  Commonly known as:  VALIUM  Take 2 mg by mouth every 6 (six) hours as needed for muscle spasms (for treatment of TMJ).     estradiol 0.5 MG tablet  Commonly known as:  ESTRACE  Take 0.5 mg by mouth daily.     fluticasone 50 MCG/ACT nasal spray  Commonly known as:  FLONASE  Place 1 spray into both nostrils daily as needed for allergies or rhinitis.     hydrochlorothiazide 25 MG tablet  Commonly known as:  HYDRODIURIL  Take 25 mg by mouth daily.     losartan 50 MG tablet  Commonly known as:  COZAAR  Take 1 tablet (50 mg total) by mouth daily.     meclizine 25 MG tablet  Commonly known as:  ANTIVERT  Take 25 mg by mouth daily as needed for dizziness.     methocarbamol 500 MG tablet  Commonly known as:  ROBAXIN  Take 1 tablet (500 mg total) by mouth every 6 (six) hours as needed for muscle spasms.     multivitamin tablet  Take 1 tablet by mouth daily.     omeprazole 40 MG capsule  Commonly known as:  PRILOSEC  Take 40 mg by mouth daily.     oxyCODONE-acetaminophen  5-325 MG per tablet  Commonly known as:  PERCOCET/ROXICET  Take 1-2 tablets by mouth every 4 (four) hours as needed for moderate pain or severe pain.     PARoxetine 20 MG tablet  Commonly known as:  PAXIL  Take 20 mg by mouth every morning.     PROAIR HFA 108 (90 BASE) MCG/ACT inhaler  Generic drug:  albuterol  Inhale 2 puffs into the lungs every 6 (six) hours as needed for wheezing or shortness of breath.     STOOL SOFTENER PO  Take 1 tablet by mouth daily.     verapamil 180 MG (CO) 24 hr tablet  Commonly known as:  COVERA HS  Take 180 mg by mouth 2 (two) times daily.         Signed: Dorian Heckle, MD 08/27/2013, 8:19 AM

## 2013-08-27 NOTE — Progress Notes (Signed)
OT Discharge Note  Patient Details Name: Jessica Armstrong MRN: [id number] DOB: [birth year]   Cancelled Treatment:    Reason Eval/Treat Not Completed: Other (comment) (Pt discharged prior to OT arrival at [facility])  Harolyn Rutherford Pager: [phone]  08/27/2013, 9:21 AM

## 2013-08-27 NOTE — Progress Notes (Signed)
Pt and husband given D/C instructions with Rx's, verbal understanding of teaching was given. Pt D/C'd home via wheelchair @ [phone] with husband per MD order. Pt stable @ D/C and had no other needs. Rema Fendt, RN

## 2013-08-29 ENCOUNTER — Encounter (HOSPITAL_COMMUNITY): Payer: Self-pay | Admitting: Neurosurgery

## 2014-02-20 LAB — HM MAMMOGRAPHY

## 2014-05-15 ENCOUNTER — Ambulatory Visit: Payer: Medicare PPO | Admitting: *Deleted

## 2014-05-15 VITALS — BP 145/75 | HR 73 | Wt 164.1 lb

## 2014-05-15 DIAGNOSIS — I10 Essential (primary) hypertension: Secondary | ICD-10-CM

## 2014-05-15 NOTE — Patient Instructions (Signed)
Please follow up with Dr. Rockey Situ (PA/NP if Dr. Rockey Situ has no availability) early next week  Please keep a blood pressure log over the weekend and bring it to your appointment    Please contact EMS if you feel your situation becomes emergent

## 2014-05-15 NOTE — Progress Notes (Signed)
Patient walked in clinic this afternoon stating that she was worried that her blood pressure  She has been sick for a few weeks and on an antibiotic  She feels that her pressure is low because she feels dizzy and "just not right"  I informed patient that her blood pressure is currently 145/75 I rechecked it manually about 10 minutes after the first reading it was 136/72  I advised patient to make appt early next week with Dr. Rockey Situ or Ignacia Bayley  I instructed her to keep a blood pressure log until that time   Advised patient to visit the ED if she feels her situation becomes emergent over the weekend  Patient verbalized understanding

## 2014-05-18 ENCOUNTER — Ambulatory Visit: Payer: Self-pay | Admitting: Family Medicine

## 2014-09-11 ENCOUNTER — Ambulatory Visit: Payer: Self-pay | Admitting: Family Medicine

## 2014-12-01 LAB — HEPATIC FUNCTION PANEL
ALT: 17 U/L (ref 7–35)
AST: 18 U/L (ref 13–35)
Alkaline Phosphatase: 95 U/L (ref 25–125)
Bilirubin, Total: 0.5 mg/dL

## 2014-12-01 LAB — LIPID PANEL
Cholesterol: 198 mg/dL (ref 0–200)
HDL: 61 mg/dL (ref 35–70)
LDL Cholesterol: 106 mg/dL
LDl/HDL Ratio: 1.7
Triglycerides: 157 mg/dL (ref 40–160)

## 2014-12-01 LAB — TSH: TSH: 2.99 u[IU]/mL (ref 0.41–5.90)

## 2014-12-01 LAB — BASIC METABOLIC PANEL
BUN: 19 mg/dL (ref 4–21)
Creatinine: 0.9 mg/dL (ref 0.5–1.1)
Glucose: 124 mg/dL
Potassium: 5.1 mmol/L (ref 3.4–5.3)
Sodium: 144 mmol/L (ref 137–147)

## 2015-02-11 LAB — CBC AND DIFFERENTIAL
HCT: 37 % (ref 36–46)
Hemoglobin: 12.6 g/dL (ref 12.0–16.0)
Neutrophils Absolute: 5 /uL
Platelets: 325 10*3/uL (ref 150–399)
WBC: 7.6 10^3/mL

## 2015-03-16 ENCOUNTER — Telehealth: Payer: Self-pay

## 2015-03-16 NOTE — Patient Instructions (Signed)
Patient came in office today for BP check. Patient reports that she does have some lightheadedness that comes and goes. Symptoms have gotten worse over the past few weeks. Advised Dennis Chrismon, PA-C and he recommends that patient hold Verapamil 180 mg every 3rd day (take 1 tablet daily for 2 days, then hold 1 day, then repeat). Patient verbalizes understanding and agrees to treatment plan.

## 2015-03-16 NOTE — Telephone Encounter (Signed)
erroneous

## 2015-03-23 NOTE — Patient Instructions (Addendum)
Patient comes in today for BP check. She has been taking Verapamil 180 mg at a decreased dose (hold dose every 3rd day) for the past week. Patient denies any side effects since decreasing medication. She reports that she still feels really fatigued but it is much better than last week. Advised Dennis Chrismon, PA-C of BP readings and advised patient to continue current dose. Patient will return next week for another BP check. Patient verbalizes understanding, and agrees to treatment plan.

## 2015-04-07 NOTE — Patient Instructions (Signed)
Patient comes in today for a blood pressure check. She reports that she has been consistent with taking Verapamil 180 mg, and holding dose every 3rd day ( take 1 tablet daily for 2 days, then hold one day, then repeat). She reports that she is now having occasional hot flashes that lasts only a few minutes. She reports that she has 2-3 hot flashes a day. Patient also mentions that she still feels really tired, and has no energy.  Patient is becoming concerned because her dystolic number is still running low. Patient reports that she has a strong family history of heart disease, and she would like to be referred to a cardiologist for further evaluation. Patient reports that she has seen Dr. Rockey Situ in the past. Patient would like to know if we could possibly have this set up? Her contact number is [phone]. Thanks!

## 2015-04-09 NOTE — Progress Notes (Signed)
Called patient to advise that she can call Dr. Rockey Situ to schedule her appt for further evaluation.

## 2015-04-19 ENCOUNTER — Encounter: Payer: Self-pay | Admitting: Emergency Medicine

## 2015-04-19 ENCOUNTER — Encounter: Payer: Self-pay | Admitting: Physician Assistant

## 2015-04-19 ENCOUNTER — Telehealth: Payer: Self-pay | Admitting: Cardiovascular Disease

## 2015-04-19 ENCOUNTER — Ambulatory Visit (INDEPENDENT_AMBULATORY_CARE_PROVIDER_SITE_OTHER): Payer: PPO | Admitting: Physician Assistant

## 2015-04-19 VITALS — BP 146/78 | HR 75 | Ht 67.0 in | Wt 168.2 lb

## 2015-04-19 DIAGNOSIS — N951 Menopausal and female climacteric states: Secondary | ICD-10-CM

## 2015-04-19 DIAGNOSIS — I10 Essential (primary) hypertension: Secondary | ICD-10-CM | POA: Diagnosis not present

## 2015-04-19 DIAGNOSIS — R011 Cardiac murmur, unspecified: Secondary | ICD-10-CM

## 2015-04-19 DIAGNOSIS — F419 Anxiety disorder, unspecified: Secondary | ICD-10-CM | POA: Insufficient documentation

## 2015-04-19 DIAGNOSIS — B078 Other viral warts: Secondary | ICD-10-CM | POA: Insufficient documentation

## 2015-04-19 DIAGNOSIS — K2100 Gastro-esophageal reflux disease with esophagitis, without bleeding: Secondary | ICD-10-CM | POA: Insufficient documentation

## 2015-04-19 DIAGNOSIS — E785 Hyperlipidemia, unspecified: Secondary | ICD-10-CM

## 2015-04-19 DIAGNOSIS — R5382 Chronic fatigue, unspecified: Secondary | ICD-10-CM | POA: Diagnosis not present

## 2015-04-19 DIAGNOSIS — E7800 Pure hypercholesterolemia, unspecified: Secondary | ICD-10-CM | POA: Insufficient documentation

## 2015-04-19 DIAGNOSIS — R0602 Shortness of breath: Secondary | ICD-10-CM

## 2015-04-19 DIAGNOSIS — K5792 Diverticulitis of intestine, part unspecified, without perforation or abscess without bleeding: Secondary | ICD-10-CM | POA: Insufficient documentation

## 2015-04-19 DIAGNOSIS — E749 Disorder of carbohydrate metabolism, unspecified: Secondary | ICD-10-CM | POA: Insufficient documentation

## 2015-04-19 DIAGNOSIS — J309 Allergic rhinitis, unspecified: Secondary | ICD-10-CM | POA: Insufficient documentation

## 2015-04-19 DIAGNOSIS — R6889 Other general symptoms and signs: Secondary | ICD-10-CM

## 2015-04-19 DIAGNOSIS — G629 Polyneuropathy, unspecified: Secondary | ICD-10-CM | POA: Insufficient documentation

## 2015-04-19 DIAGNOSIS — R5383 Other fatigue: Secondary | ICD-10-CM | POA: Insufficient documentation

## 2015-04-19 DIAGNOSIS — Z79890 Hormone replacement therapy (postmenopausal): Secondary | ICD-10-CM | POA: Insufficient documentation

## 2015-04-19 DIAGNOSIS — R232 Flushing: Secondary | ICD-10-CM

## 2015-04-19 DIAGNOSIS — K219 Gastro-esophageal reflux disease without esophagitis: Secondary | ICD-10-CM | POA: Insufficient documentation

## 2015-04-19 DIAGNOSIS — M1990 Unspecified osteoarthritis, unspecified site: Secondary | ICD-10-CM | POA: Insufficient documentation

## 2015-04-19 DIAGNOSIS — I319 Disease of pericardium, unspecified: Secondary | ICD-10-CM | POA: Insufficient documentation

## 2015-04-19 DIAGNOSIS — R739 Hyperglycemia, unspecified: Secondary | ICD-10-CM | POA: Insufficient documentation

## 2015-04-19 MED ORDER — VERAPAMIL HCL ER 120 MG PO TBCR: 120.0000 mg | EXTENDED_RELEASE_TABLET | Freq: Every day | ORAL | Status: DC

## 2015-04-19 MED ORDER — CLONIDINE HCL 0.1 MG PO TABS: 0.1000 mg | ORAL_TABLET | Freq: Every day | ORAL | Status: DC

## 2015-04-19 NOTE — Patient Instructions (Addendum)
Medication Instructions:  Your physician has recommended you make the following change in your medication:  DECREASE verapamil to 120mg  once per day START taking clonidine 0.1 mg as needed for BP >140/90   Labwork: Your physician recommends that you have labs today: CMET   Testing/Procedures: Your physician has requested that you have a treadmill  myoview.   [place]  Your caregiver has ordered a Stress Test with nuclear imaging. The purpose of this test is to evaluate the blood supply to your heart muscle. This procedure is referred to as a "Non-Invasive Stress Test." This is because other than having an IV started in your vein, nothing is inserted or "invades" your body. Cardiac stress tests are done to find areas of poor blood flow to the heart by determining the extent of coronary artery disease (CAD). Some patients exercise on a treadmill, which naturally increases the blood flow to your heart, while others who are  unable to walk on a treadmill due to physical limitations have a pharmacologic/chemical stress agent called Lexiscan . This medicine will mimic walking on a treadmill by temporarily increasing your coronary blood flow.   Please note: these test may take anywhere between 2-4 hours to complete  PLEASE REPORT TO [facility] AT THE FIRST DESK WILL DIRECT YOU WHERE TO GO  Date of Procedure: Monday, July 18, 7:30am  Arrival Time for Procedure: 7:00am  Instructions regarding medication:   PLEASE NOTIFY THE OFFICE AT LEAST 24 HOURS IN ADVANCE IF YOU ARE UNABLE TO [place].  [phone] AND  PLEASE NOTIFY NUCLEAR MEDICINE AT [facility] AT LEAST 24 HOURS IN ADVANCE IF YOU ARE UNABLE TO KEEP YOUR APPOINTMENT. [phone]  How to prepare for your Myoview test:  1. Do not eat or drink after midnight 2. No caffeine for 24 hours prior to test 3. No smoking 24 hours prior to test. 4. Your medication may be taken with water.  If your  doctor stopped a medication because of this test, do not take that medication. 5. Ladies, please do not wear dresses.  Skirts or pants are appropriate. Please wear a short sleeve shirt. 6. No perfume, cologne or lotion. 7. Wear comfortable walking shoes. No heels!           Your physician has requested that you have a carotid duplex. This test is an ultrasound of the carotid arteries in your neck. It looks at blood flow through these arteries that supply the brain with blood. Allow one hour for this exam. There are no restrictions or special instructions.  Your physician has requested that you have an echocardiogram. Echocardiography is a painless test that uses sound waves to create images of your heart. It provides your doctor with information about the size and shape of your heart and how well your heart's chambers and valves are working. This procedure takes approximately one hour. There are no restrictions for this procedure.    Follow-Up: BP check in one week  Your physician recommends that you schedule a follow-up appointment in: three months with Christell Faith, PA   Any Other Special Instructions Will Be Listed Below (If Applicable).

## 2015-04-19 NOTE — Progress Notes (Signed)
Cardiology Office Note:  Date of Encounter: 04/19/2015  ID: Jessica Armstrong, Jessica Armstrong 01-Dec-1945, MRN [id number]  PCP:  Wilhemena Durie, MD Primary Cardiologist:  Dr. Rockey Situ, MD  Chief Complaint  Patient presents with  . other    Patient was seen on Sunday at Urgent Care with elevated BP.  Meds reviewed by the patient verbally.     HPI:  69 y.o. female with history of labile HTN, hot flashes, tricuspid valve regurgitation, pulmonary valve regurgitation, and HLD who presents to clinic today for evaluation of an elevated blood pressure reading at urgent care on 04/18/15 of 186/78.  She was last seen here in 04/2013 for the same. At that time she underwent echo that showed EF 55-60%, normal wall motion, normal LV diastolic function, mild to moderate TR, PASP 35 mm Hg. She was seen for a nurse only visit 05/2014 with continued HTN and dizziness and advised to follow up, though she never did.   She has had difficulty controlling her blood pressure for many years and Dr. Rosanna Randy has made several adjustments to her blood pressure medications over the last 2 months, that comprise decreasing her antihypertensives 2/2 soft BPs. She reports an initial diagnosis of HTN at an early age around her menstrual cycle, then she was lost to follow up until her 52's when she was started on antihypertensives.  She says that she had a terrible headache on 7/8 and slept most of the day, and then on 7/10 she felt dizzy and foggy so she went to urgent care to have her blood pressure checked.  She was advised by urgent care to see a cardiologist on Monday (today, 7/11) for an elevated BP reading of 145/75.  She also states that she had been experiencing hot flashes, nausea, and shortness of breath on exertion over the last year.  She stopped taking estrogen in February and experienced hot flashes, but they have since subsided until the last month, and she states these hot flashes are worse than any she ever experienced when  coming off estrogen.  She states she cannot go from bed to the bathroom without becoming out of breath.  After walking or taking a flight of stairs she experiences chest pain on the left side of her chest in addition to the shortness of breath.  She has seen a pulmonologist and has been told she does not have COPD.  She is concerned today because she has been feeling unlike herself for several months and she has varying blood pressure readings, with diastolic BP ranging from the low 40's to the upper 90's.  She and her husband would like to determine what is causing her symptoms.  She denies leg swelling, PND, having to sleep at an elevation.  She has a blood pressure cuff at home but does not regularly check her blood pressure.  However, she is willing to use it daily for monitoring purposes.              Past Medical History  Diagnosis Date  . Hypercholesterolemia   . Post-menopausal   . Hypertension   . Claudication   . Hyperglycemia   . Cystitis   . Palpitations   . Neuropathy   . Colon polyp   . History of measles   . History of mumps   . Pulmonary valve dysplasia   . PONV (postoperative nausea and vomiting)     severe n/v after anesthesia  . Seasonal allergies   . H/O vertigo   .  GERD (gastroesophageal reflux disease)   . Depression     situational  . Anemia     mild; no medical treatment  . Arthritis   . Need for SBE (subacute bacterial endocarditis) prophylaxis   . Tricuspid regurgitation     a. echo 04/2013: EF 55-60%, nl wall motion, mild to mod TR, PASP 35 mm Hg  . Cervical spondyloarthritis   :  Past Surgical History  Procedure Laterality Date  . Cholecystectomy    . Abdominal hysterectomy    . Anterior cervical decomp/discectomy fusion N/A 08/26/2013    Procedure: CERVICAL SIX TO SEVEN ANTERIOR CERVICAL DECOMPRESSION/DISCECTOMY FUSION 1 LEVEL;  Surgeon: Erline Levine, MD;  Location: [facility];  Service: Neurosurgery;  Laterality: N/A;  C6-7 Anterior cervical  decompression/diskectomy/fusion  :  Social History:  The patient  reports that she has never smoked. She does not have any smokeless tobacco history on file. She reports that she drinks alcohol. She reports that she does not use illicit drugs.   Family History  Problem Relation Age of Onset  . Heart disease Mother   . Hypertension Mother   . Hypertension Brother   . Colon cancer Father   . Asthma Maternal Grandfather   . COPD Mother     was a smoker     Allergies:  Allergies  Allergen Reactions  . Codeine Sulfate Nausea And Vomiting  . Keflex [Cephalexin] Nausea And Vomiting  . Macrobid [Nitrofurantoin Macrocrystal] Nausea And Vomiting  . Sulfa Antibiotics Nausea And Vomiting     Home Medications:  Current Outpatient Prescriptions  Medication Sig Dispense Refill  . albuterol (PROAIR HFA) 108 (90 BASE) MCG/ACT inhaler Inhale 2 puffs into the lungs every 6 (six) hours as needed for wheezing or shortness of breath.    . diazepam (VALIUM) 2 MG tablet Take 2 mg by mouth every 6 (six) hours as needed for muscle spasms (for treatment of TMJ).    . fluticasone (FLONASE) 50 MCG/ACT nasal spray Place 1 spray into both nostrils daily as needed for allergies or rhinitis.    Marland Kitchen losartan (COZAAR) 50 MG tablet Take 1 tablet (50 mg total) by mouth daily. 30 tablet 6  . meclizine (ANTIVERT) 25 MG tablet Take 25 mg by mouth daily as needed for dizziness.     . Multiple Vitamin (MULTIVITAMIN) tablet Take 1 tablet by mouth daily.    Marland Kitchen omeprazole (PRILOSEC) 40 MG capsule Take 40 mg by mouth daily.    Marland Kitchen PARoxetine (PAXIL) 20 MG tablet Take 20 mg by mouth every morning.    . verapamil (COVERA HS) 180 MG (CO) 24 hr tablet Take 180 mg by mouth 2 (two) times daily.      No current facility-administered medications for this visit.     Review of Systems:  Review of Systems  Constitutional: Positive for weight loss and malaise/fatigue. Negative for fever, chills and diaphoresis.  HENT: Negative  for congestion.   Eyes: Negative for blurred vision, discharge and redness.  Respiratory: Positive for shortness of breath. Negative for cough, hemoptysis, sputum production and wheezing.   Cardiovascular: Positive for chest pain and palpitations. Negative for orthopnea, claudication, leg swelling and PND.  Gastrointestinal: Negative for heartburn, nausea, vomiting and abdominal pain.  Musculoskeletal: Negative for myalgias and falls.  Skin: Negative for rash.  Neurological: Positive for dizziness, weakness and headaches. Negative for tingling, tremors, sensory change, speech change, focal weakness, seizures and loss of consciousness.  Endo/Heme/Allergies: Does not bruise/bleed easily.  Psychiatric/Behavioral: Negative for hallucinations.  The patient is not nervous/anxious.   All other systems reviewed and are negative.    Physical Exam:  Blood pressure 146/78, pulse 75, height 5\' 7"  (1.702 m), weight 168 lb 4 oz (76.318 kg). BMI: Body mass index is 26.35 kg/(m^2). General: Pleasant, NAD. Psych: Normal affect. Responds to questions with normal affect.  Neuro: Alert and oriented X 3. Moves all extremities spontaneously. HEENT: Normocephalic, atraumatic. EOM intact. Sclera anicteric.  Neck: Trachea midline. Supple without bruits or JVD. Lungs:  Respirations regular and unlabored. CTA bilaterally without wheezing, crackles, or rhonchi.  Heart: RRR, normal s3, s4. II/VI systolic murmurs RUSB. No rubs or gallops.  Abdomen: Soft, non-tender, non-distended, BS + x 4.  Extremities: No clubbing, cyanosis or edema. DP/PT/Radials 2+ and equal bilaterally.   Accessory Clinical Findings:  EKG: NSR, 75 bpm, no significant st/t changes   Recent Labs: No results found for requested labs within last 365 days.  No results found for requested labs within last 365 days.  CrCl cannot be calculated (Patient has no serum creatinine result on file.).  Weights: Wt Readings from Last 3 Encounters:    04/19/15 168 lb 4 oz (76.318 kg)  05/15/14 164 lb 1.9 oz (74.444 kg)  08/15/13 174 lb 6 oz (79.096 kg)    Other studies Reviewed: Additional studies/ records that were reviewed today include: prior notes.  Assessment & Plan:  1. Labile HTN: -Decrease verapamil to 120 mg daily to allow for her to take daily -Perhaps some of the variability in her BP readings are coming from her taking the medication 2/3 days -Add clonidine 0.1 mg prn BP >140/90 -Check CMET -Given her soft BP readings as of late less likely to suspect underlying etiology such as phaeochromocytoma though should still consider in differential if readings remain difficult to control.    2. SOB: -Check echo to evaluate LV function and wall motion  -Check treadmill Myoview to evaluate for high risk ischemia  -Has previously seen pulmonology and been tested for COPD, negative   3. 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 Forgetfulness: -She will get lost easily as of late -Perhaps there is some dementia in play here -Follow up per PCP   Dispo: -Await echo, stress test, and carotid dopplers -Follow up 3 months  Current medicines are reviewed at length with the patient today.  The patient did not have any concerns regarding medicines.   Christell Faith, PA-C [place],  [id number] [phone] [facility] 04/19/2015, 11:46 AM

## 2015-04-19 NOTE — Telephone Encounter (Signed)
Pt has not been seen since 2014. She is sched to see Christell Faith, PA this morning.

## 2015-04-19 NOTE — Telephone Encounter (Signed)
Pt c/o BP issue: STAT if pt c/o blurred vision, one-sided weakness or slurred speech  1. What are your last 5 BP readings? Does not have last 5 reading but Jessica Armstrong has been monitoring bp (has been 130's/40's)  Urgent care visit yesterday 04/18/15 patient had bp 186/78 and was advised to come see G[facility]an  2. Are you having any other symptoms (ex. Dizziness, headache, blurred vision, passed out)?  Mild blurry vision, weak especially in legs (b/l) and feels like she may pass out,  bad headache on Friday, feels disoriented VERY Nauseated  3. What is your BP issue? PCP changed bp meds patient thinks they may have been drastically changed too quickly

## 2015-04-20 ENCOUNTER — Ambulatory Visit: Payer: PPO | Admitting: Family Medicine

## 2015-04-20 LAB — COMPREHENSIVE METABOLIC PANEL
ALT: 19 IU/L (ref 0–32)
AST: 18 IU/L (ref 0–40)
Albumin/Globulin Ratio: 1.8 (ref 1.1–2.5)
Albumin: 4.4 g/dL (ref 3.5–4.8)
Alkaline Phosphatase: 100 IU/L (ref 39–117)
BUN/Creatinine Ratio: 24 (ref 11–26)
BUN: 20 mg/dL (ref 8–27)
Bilirubin Total: 0.3 mg/dL (ref 0.0–1.2)
CO2: 21 mmol/L (ref 18–29)
Calcium: 9.8 mg/dL (ref 8.7–10.3)
Chloride: 102 mmol/L (ref 97–108)
Creatinine, Ser: 0.85 mg/dL (ref 0.57–1.00)
GFR calc Af Amer: 80 mL/min/{1.73_m2} (ref >59)
GFR calc non Af Amer: 70 mL/min/{1.73_m2} (ref >59)
Globulin, Total: 2.5 g/dL (ref 1.5–4.5)
Glucose: 115 mg/dL — ABNORMAL HIGH (ref 65–99)
Potassium: 5.4 mmol/L — ABNORMAL HIGH (ref 3.5–5.2)
Sodium: 141 mmol/L (ref 134–144)
Total Protein: 6.9 g/dL (ref 6.0–8.5)

## 2015-04-23 ENCOUNTER — Ambulatory Visit (INDEPENDENT_AMBULATORY_CARE_PROVIDER_SITE_OTHER): Payer: PPO | Admitting: Family Medicine

## 2015-04-23 ENCOUNTER — Encounter: Payer: Self-pay | Admitting: Family Medicine

## 2015-04-23 ENCOUNTER — Telehealth: Payer: Self-pay | Admitting: *Deleted

## 2015-04-23 VITALS — BP 136/56 | HR 93 | Temp 97.9°F | Resp 16 | Wt 170.8 lb

## 2015-04-23 DIAGNOSIS — I10 Essential (primary) hypertension: Secondary | ICD-10-CM

## 2015-04-23 DIAGNOSIS — K5732 Diverticulitis of large intestine without perforation or abscess without bleeding: Secondary | ICD-10-CM

## 2015-04-23 DIAGNOSIS — J0100 Acute maxillary sinusitis, unspecified: Secondary | ICD-10-CM

## 2015-04-23 MED ORDER — DOXYCYCLINE HYCLATE 100 MG PO TABS: 100.0000 mg | ORAL_TABLET | Freq: Two times a day (BID) | ORAL | Status: DC

## 2015-04-23 NOTE — Telephone Encounter (Signed)
Pt would like to cancel stress test on Monday   Pt c/o medication issue:  1. Name of Medication: CloNIDine  2. How are you currently taking this medication (dosage and times per day)? Only taking when BP Is high and BP is about 170 right now.   3. Are you having a reaction (difficulty breathing--STAT)?  No   4. What is your medication issue? Having a major headache and thinks it is because of this medication, and it won't go away.

## 2015-04-23 NOTE — Telephone Encounter (Signed)
S/w pt who states BP continues to be elevated. Clonidine added at 7/11 OV. States she only takes BP once per day, before taking meds. Does not recheck Reports Wed-Fri readings: 160/93, 168/77, 154/66 Has not taken any meds today and reports headache all week which could be related to elevated pressures.  Instructed patient to take BP meds now, recheck in an hour and call with findings. Suggested to pt to start rechecking pressure during the day to determine effectiveness of medications. Pt cancelled Monday stress test saying there was "no way she could do the test until her headache is better"  Indicates diverticulitis is acting up and wants to get that under control first.  Will forward to [employer], PA-C after she reports second BP today.

## 2015-04-23 NOTE — Progress Notes (Signed)
Subjective:    Patient ID: Jessica Armstrong, female    DOB: [birth year], 69 y.o.   MRN: [id number] Chief Complaint  Patient presents with  . Abdominal Pain    lower abdomen X 2 days     HPI  This 69 year old female complains of headache over the past few days around eyes and back of head. Feels congested, with earache and sore throat. Temperature was 101 last night with chills and lower abdomen pains. Went to cardiologist and had some abdominal discomfort during the exam. No diarrhea, nausea or vomiting. Stool was hard and constipated yesterday. Feels she had a "sinus infection" and "diverticulitis" again.  Past Medical History  Diagnosis Date  . Hypercholesterolemia   . Post-menopausal   . Hypertension   . Claudication   . Hyperglycemia   . Cystitis   . Palpitations   . Neuropathy   . Colon polyp   . History of measles   . History of mumps   . Pulmonary valve dysplasia   . PONV (postoperative nausea and vomiting)     severe n/v after anesthesia  . Seasonal allergies   . H/O vertigo   . GERD (gastroesophageal reflux disease)   . Depression     situational  . Anemia     mild; no medical treatment  . Arthritis   . Need for SBE (subacute bacterial endocarditis) prophylaxis   . Tricuspid regurgitation     a. echo 04/2013: EF 55-60%, nl wall motion, mild to mod TR, PASP 35 mm Hg  . Cervical spondyloarthritis    History  Substance Use Topics  . Smoking status: Never Smoker   . 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 Alcohol Use: Yes     Comment: rare use 1-2 drinks per tyear.    Past Surgical History  Procedure Laterality Date  . Cholecystectomy    . Abdominal hysterectomy    . Anterior cervical decomp/discectomy fusion N/A 08/26/2013    Procedure: CERVICAL SIX TO SEVEN ANTERIOR CERVICAL DECOMPRESSION/DISCECTOMY FUSION 1 LEVEL;  Surgeon: Erline Levine, MD;  Location: [facility];  Service: Neurosurgery;   Laterality: N/A;  C6-7 Anterior cervical decompression/diskectomy/fusion  . Eye surgery      benign tumor of eye   Family History  Problem Relation Age of Onset  . Heart disease Mother   . Hypertension Mother   . Hypertension Brother   . Colon cancer Father   . Asthma Maternal Grandfather   . COPD Mother     was a smoker   Current Outpatient Prescriptions on File Prior to Visit  Medication Sig Dispense Refill  . albuterol (PROAIR HFA) 108 (90 BASE) MCG/ACT inhaler Inhale into the lungs.    . cloNIDine (CATAPRES) 0.1 MG tablet Take 1 tablet (0.1 mg total) by mouth daily. 0.1mg  daily as needed for BP >140/90 30 tablet 0  . diazepam (VALIUM) 2 MG tablet Take by mouth.    . estradiol (ESTRACE) 0.5 MG tablet Take by mouth.    . fluconazole (DIFLUCAN) 150 MG tablet Take by mouth.    . fluticasone (FLONASE) 50 MCG/ACT nasal spray Place into the nose.    Marland Kitchen HYDROcodone-acetaminophen (NORCO/VICODIN) 5-325 MG per tablet Take by mouth.    . loratadine (CLARITIN) 10 MG tablet Take by mouth.    . losartan (COZAAR) 50 MG tablet Take by mouth.    . meclizine (ANTIVERT) 25 MG tablet Take  by mouth.    . meloxicam (MOBIC) 15 MG tablet Take by mouth.    . Multiple Vitamin (MULTIVITAMIN) tablet Take 1 tablet by mouth daily.    . MULTIPLE VITAMIN PO Take by mouth.    Marland Kitchen omeprazole (PRILOSEC) 40 MG capsule Take by mouth.    Marland Kitchen PARoxetine (PAXIL) 20 MG tablet Take by mouth.    . promethazine (PHENERGAN) 25 MG tablet Take by mouth.    . verapamil (CALAN-SR) 120 MG CR tablet Take 1 tablet (120 mg total) by mouth at bedtime. 30 tablet 5   No current facility-administered medications on file prior to visit.   Allergies  Allergen Reactions  . Codeine Sulfate Nausea And Vomiting  . Keflex [Cephalexin] Nausea And Vomiting  . Macrobid [Nitrofurantoin Macrocrystal] Nausea And Vomiting  . Sulfa Antibiotics Nausea And Vomiting   Review of Systems  Constitutional: Positive for fever.       Up to 101 yesterday  at home.  HENT: Positive for congestion, ear pain, postnasal drip and sinus pressure. Negative for sore throat.   Eyes: Negative.   Respiratory: Positive for cough and shortness of breath. Negative for wheezing.   Cardiovascular: Positive for chest pain and palpitations.       Followed by cardiologist 04-19-15 and is scheduled for a stress test. Will have echocardiogram and doppler of carotids.  Gastrointestinal: Positive for abdominal pain. Negative for diarrhea.  Genitourinary: Negative.        BP 136/56 mmHg  Pulse 93  Temp(Src) 97.9 F (36.6 C) (Oral)  Resp 16  Wt 170 lb 12.8 oz (77.474 kg)  SpO2 97%  Objective:   Physical Exam  Constitutional: She is oriented to person, place, and time. She appears well-nourished.  HENT:  Head: Normocephalic and atraumatic.  Right Ear: External ear normal.  Left Ear: External ear normal.  Nose: Right sinus exhibits no frontal sinus tenderness. Left sinus exhibits maxillary sinus tenderness.  Mouth/Throat: Oropharynx is clear and moist.  Eyes: Conjunctivae and EOM are normal.  Neck: Normal range of motion. Neck supple.  Cardiovascular: Normal rate, regular rhythm, normal heart sounds and intact distal pulses.   Pulmonary/Chest: Breath sounds normal.  Abdominal: Soft. Bowel sounds are normal. There is tenderness.  LLQ  Neurological: She is alert and oriented to person, place, and time.  Skin: Skin is warm and dry.  Psychiatric: She has a normal mood and affect. Her behavior is normal.      Assessment & Plan:  1. Diverticulitis of large intestine without perforation or abscess without bleeding Recent onset of abdominal discomfort similar to the diverticulitis episode in December 2015. Had temperature spike to 101 last night. Will check CBC with diff and start antibiotic that "worked well the last time". Recheck pending reports. May need to consider CT scan. (Colonoscopy by Dr. Vira Agar on 07-18-13 showed sigmoid diverticula). - doxycycline  (VIBRA-TABS) 100 MG tablet; Take 1 tablet (100 mg total) by mouth 2 (two) times daily.  Dispense: 20 tablet; Refill: 0 - CBC with Differential/Platelet  2. Subacute maxillary sinusitis Recent onset with maxillary tenderness and congestion. Will treat with antibiotic and may add Mucinex prn. Continue Loratadine and Flonase. Recheck if no better in a week. - doxycycline (VIBRA-TABS) 100 MG tablet; Take 1 tablet (100 mg total) by mouth 2 (two) times daily.  Dispense: 20 tablet; Refill: 0 - CBC with Differential/Platelet  3. Essential hypertension Good BP today. Continues to use Clonidine 0.1 mg qd prn, Losartan 50 mg qd and Verapamil 120 mg  qd per cardiologist (Dr. Rockey Situ) recommendation. Due to have stress test, echocardiogram and carotic doppler next week. Continue present medication and follow up with Dr. Rockey Situ.

## 2015-04-23 NOTE — Telephone Encounter (Signed)
Pt left VM BP 154/66 this AM. Took clonidine, BP now 144/61 Per 7/15 notes, [place], BP 136/56 at today's appointment. Instructed patient to take BP in AM before meds, take clonidine if >140/90 and recheck couple hours after meds. Monitor and CB if pressures continue to be elevated.  Pt verbalized understanding with no further questions.

## 2015-04-23 NOTE — Telephone Encounter (Signed)
Left message with spouse at home number and voice mail message on cell phone.  Stress test cancelled in system, confirmed with Vivien Rota in scheduling.

## 2015-04-24 LAB — CBC WITH DIFFERENTIAL/PLATELET
Basophils Absolute: 0 10*3/uL (ref 0.0–0.2)
Basos: 0 %
EOS (ABSOLUTE): 0.1 10*3/uL (ref 0.0–0.4)
Eos: 1 %
Hematocrit: 37 % (ref 34.0–46.6)
Hemoglobin: 12.3 g/dL (ref 11.1–15.9)
Immature Grans (Abs): 0 10*3/uL (ref 0.0–0.1)
Immature Granulocytes: 0 %
Lymphocytes Absolute: 2.9 10*3/uL (ref 0.7–3.1)
Lymphs: 23 %
MCH: 30.8 pg (ref 26.6–33.0)
MCHC: 33.2 g/dL (ref 31.5–35.7)
MCV: 93 fL (ref 79–97)
Monocytes Absolute: 1.7 10*3/uL — ABNORMAL HIGH (ref 0.1–0.9)
Monocytes: 14 %
Neutrophils Absolute: 7.7 10*3/uL — ABNORMAL HIGH (ref 1.4–7.0)
Neutrophils: 62 %
Platelets: 279 10*3/uL (ref 150–379)
RBC: 3.99 x10E6/uL (ref 3.77–5.28)
RDW: 13.4 % (ref 12.3–15.4)
WBC: 12.5 10*3/uL — ABNORMAL HIGH (ref 3.4–10.8)

## 2015-04-29 ENCOUNTER — Ambulatory Visit: Payer: PPO

## 2015-04-29 ENCOUNTER — Ambulatory Visit (INDEPENDENT_AMBULATORY_CARE_PROVIDER_SITE_OTHER): Payer: PPO

## 2015-04-29 ENCOUNTER — Telehealth: Payer: Self-pay

## 2015-04-29 ENCOUNTER — Other Ambulatory Visit: Payer: Self-pay | Admitting: Physician Assistant

## 2015-04-29 ENCOUNTER — Other Ambulatory Visit: Payer: Self-pay

## 2015-04-29 VITALS — BP 138/70

## 2015-04-29 DIAGNOSIS — E785 Hyperlipidemia, unspecified: Secondary | ICD-10-CM

## 2015-04-29 DIAGNOSIS — R0602 Shortness of breath: Secondary | ICD-10-CM | POA: Diagnosis not present

## 2015-04-29 DIAGNOSIS — I10 Essential (primary) hypertension: Secondary | ICD-10-CM

## 2015-04-29 NOTE — Patient Instructions (Signed)
1.) Reason for visit: BP check  2.) Name of MD requesting visit: Christell Faith, PA-C  3.) H&P: History of HTN. Medications adjusted at 7/11 OV. Verapamil decreased to 120mg  daily, clonidine 0.1 PRN.   4.) ROS related to problem: States BP had been elevated. Clonidine added. Pt brought BP log to nurse visit today. Pt states she has been taking medications as directed. No missed doses. States last week BP 502 systolic once and she felt weak and dizzy.    5.) Assessment and plan per MD: Will forward today's BP and BP log to Christell Faith, PA-C

## 2015-04-29 NOTE — Telephone Encounter (Signed)
S/w pt who states she wants to reschedule myoview from 7/18 Rescheduled 7/25 at 8:30am Pt verbalized understanding and states she will be there

## 2015-04-29 NOTE — Telephone Encounter (Signed)
Have patient increase clonidine to 0.2 mg if SBP >140/90. Continue verapamil 120 mg daily. Numbers are slowly starting to look better and more consistent. You are getting there.

## 2015-04-29 NOTE — Telephone Encounter (Signed)
S/w pt and reviewed recommendations with patient who verbalized understanding with no other questions.

## 2015-04-30 ENCOUNTER — Telehealth: Payer: Self-pay

## 2015-04-30 NOTE — Telephone Encounter (Signed)
Patient advised. Patient states she is feeling better. -aa

## 2015-04-30 NOTE — Telephone Encounter (Signed)
-----   Message from Margo Common, [place] sent at 04/27/2015 10:11 AM EDT ----- WBC count elevation with increase in neutrophils confirms infection. If fever persists and no better in 3-5 days of taking the antibiotic, should recheck in the office and consider need for CT scan of abdomen to rule out development of abscess.

## 2015-05-01 ENCOUNTER — Encounter: Payer: Self-pay | Admitting: Physician Assistant

## 2015-05-01 DIAGNOSIS — I5032 Chronic diastolic (congestive) heart failure: Secondary | ICD-10-CM | POA: Insufficient documentation

## 2015-05-01 DIAGNOSIS — I6529 Occlusion and stenosis of unspecified carotid artery: Secondary | ICD-10-CM | POA: Insufficient documentation

## 2015-05-03 ENCOUNTER — Other Ambulatory Visit: Payer: Self-pay | Admitting: Physician Assistant

## 2015-05-03 ENCOUNTER — Encounter
Admission: RE | Admit: 2015-05-03 | Discharge: 2015-05-03 | Disposition: A | Discharge status: Home / Self Care | Payer: PPO | Source: Ambulatory Visit | Attending: Physician Assistant | Admitting: Physician Assistant

## 2015-05-03 ENCOUNTER — Other Ambulatory Visit: Payer: Self-pay

## 2015-05-03 DIAGNOSIS — I10 Essential (primary) hypertension: Secondary | ICD-10-CM

## 2015-05-03 DIAGNOSIS — I6523 Occlusion and stenosis of bilateral carotid arteries: Secondary | ICD-10-CM

## 2015-05-03 DIAGNOSIS — R0602 Shortness of breath: Secondary | ICD-10-CM

## 2015-05-03 LAB — NM MYOCAR MULTI W/SPECT W/WALL MOTION / EF
LV dias vol: 49 mL
LV sys vol: 21 mL
Peak HR: 117 {beats}/min
Percent HR: 78 %
Rest HR: 75 {beats}/min
SDS: 4
SRS: 1
SSS: 3
TID: 1

## 2015-05-03 MED ORDER — TECHNETIUM TC 99M SESTAMIBI - CARDIOLITE
30.0000 | Freq: Once | INTRAVENOUS | Status: AC | PRN
  Administered 2015-05-03: 09:00:00 30.12 via INTRAVENOUS

## 2015-05-03 MED ORDER — TECHNETIUM TC 99M SESTAMIBI - CARDIOLITE
13.3830 | Freq: Once | INTRAVENOUS | Status: AC | PRN
Start: 2015-05-03 — End: 2015-05-03
  Administered 2015-05-03: 13.383 via INTRAVENOUS

## 2015-05-03 MED ORDER — BLOOD PRESSURE MONITOR KIT: 1.0000 | PACK | Status: DC

## 2015-05-03 MED ORDER — REGADENOSON 0.4 MG/5ML IV SOLN
0.4000 mg | Freq: Once | INTRAVENOUS | Status: AC
  Administered 2015-05-03: 0.4 mg via INTRAVENOUS

## 2015-05-03 MED ORDER — FUROSEMIDE 20 MG PO TABS: 20.0000 mg | ORAL_TABLET | Freq: Every day | ORAL | Status: DC

## 2015-05-03 NOTE — Progress Notes (Signed)
Orders have been placed.

## 2015-05-10 ENCOUNTER — Ambulatory Visit: Payer: PPO

## 2015-05-10 ENCOUNTER — Other Ambulatory Visit (INDEPENDENT_AMBULATORY_CARE_PROVIDER_SITE_OTHER): Payer: PPO

## 2015-05-10 ENCOUNTER — Telehealth: Payer: Self-pay

## 2015-05-10 VITALS — BP 154/74 | Resp 16

## 2015-05-10 DIAGNOSIS — I10 Essential (primary) hypertension: Secondary | ICD-10-CM

## 2015-05-10 NOTE — Telephone Encounter (Signed)
Have patient increase losartan to 100 mg daily and increase verapamil to 180 mg daily. She can then decrease the Clonidine over 4-5 days from her 0.2 mg to 0.1 mg to nothing so she will no longer be on this. As we move along we could either go up on the verapamil or add hydralazine if needed.

## 2015-05-10 NOTE — Patient Instructions (Signed)
1.) Reason for visit: Labs, pt requested BP check  2.) Name of MD requesting visit: Christell Faith, PA-C  3.) H&P: History of hypertension, medications recently adjusted  4.) ROS related to problem: BP at today's visit: 154/74. Pt states she has not yet taken morning meds. Will do so with breakfast today. States BP elevated in mornings, 643P systolic. Takes morning medications which decreases BP but never below 140. Pt reports she then takes clonidine 0.2mg  as directed which then makes her tired, lacks energy for the rest of the day. BP usually still remains elevated. Would like better BP control and is open to switching to different medication.   5.) Assessment and plan per MD: Forward to Christell Faith for review

## 2015-05-10 NOTE — Telephone Encounter (Signed)
Nurse visit today. Forwarded info to [employer], PA-C for review

## 2015-05-11 ENCOUNTER — Telehealth: Payer: Self-pay

## 2015-05-11 ENCOUNTER — Other Ambulatory Visit: Payer: Self-pay

## 2015-05-11 LAB — BASIC METABOLIC PANEL
BUN/Creatinine Ratio: 27 — ABNORMAL HIGH (ref 11–26)
BUN: 24 mg/dL (ref 8–27)
CO2: 20 mmol/L (ref 18–29)
Calcium: 9.8 mg/dL (ref 8.7–10.3)
Chloride: 107 mmol/L (ref 97–108)
Creatinine, Ser: 0.89 mg/dL (ref 0.57–1.00)
GFR calc Af Amer: 76 mL/min/{1.73_m2} (ref >59)
GFR calc non Af Amer: 66 mL/min/{1.73_m2} (ref >59)
Glucose: 119 mg/dL — ABNORMAL HIGH (ref 65–99)
Potassium: 5.9 mmol/L — ABNORMAL HIGH (ref 3.5–5.2)
Sodium: 146 mmol/L — ABNORMAL HIGH (ref 134–144)

## 2015-05-11 MED ORDER — VERAPAMIL HCL ER 180 MG PO TBCR: 180.0000 mg | EXTENDED_RELEASE_TABLET | Freq: Every day | ORAL | Status: DC

## 2015-05-11 MED ORDER — LOSARTAN POTASSIUM 100 MG PO TABS: 100.0000 mg | ORAL_TABLET | Freq: Every day | ORAL | Status: DC

## 2015-05-11 NOTE — Telephone Encounter (Signed)
S/w pt regarding Ryan's recommendation to increase BP meds. Instructed pt to monitor BP morning and after medication and report back to [place] in 5 days with readings.  Will submit refills to pharmacy Pt verbalized understanding.

## 2015-05-12 ENCOUNTER — Other Ambulatory Visit: Payer: Self-pay

## 2015-05-12 ENCOUNTER — Other Ambulatory Visit
Admission: RE | Admit: 2015-05-12 | Discharge: 2015-05-12 | Disposition: A | Discharge status: Home / Self Care | Payer: PPO | Source: Ambulatory Visit | Attending: Physician Assistant | Admitting: Physician Assistant

## 2015-05-12 DIAGNOSIS — E875 Hyperkalemia: Secondary | ICD-10-CM | POA: Insufficient documentation

## 2015-05-12 LAB — BASIC METABOLIC PANEL
Anion gap: 7 (ref 5–15)
BUN: 26 mg/dL — ABNORMAL HIGH (ref 6–20)
CO2: 23 mmol/L (ref 22–32)
Calcium: 9.1 mg/dL (ref 8.9–10.3)
Chloride: 111 mmol/L (ref 101–111)
Creatinine, Ser: 1.17 mg/dL — ABNORMAL HIGH (ref 0.44–1.00)
GFR calc Af Amer: 53 mL/min — ABNORMAL LOW (ref >60)
GFR calc non Af Amer: 46 mL/min — ABNORMAL LOW (ref >60)
Glucose, Bld: 157 mg/dL — ABNORMAL HIGH (ref 65–99)
Potassium: 4.3 mmol/L (ref 3.5–5.1)
Sodium: 141 mmol/L (ref 135–145)

## 2015-05-12 MED ORDER — HYDRALAZINE HCL 50 MG PO TABS: 50.0000 mg | ORAL_TABLET | Freq: Three times a day (TID) | ORAL | Status: DC

## 2015-05-26 ENCOUNTER — Telehealth: Payer: Self-pay | Admitting: *Deleted

## 2015-05-26 NOTE — Telephone Encounter (Signed)
I spoke with the patient. She reports that she feels very unsure about what is going on with her care at the present time.  She saw Ryan on 04/19/15 for HTN and medication adjustments started to be made at that time. She was not really having any symptoms when she was seen back in July, but since that time, has developed nausea and a headache. She states she just doesn't feel good. She reports that she was never told that results of her stress test (although Dr. Donivan Scull nurse documented she spoke with the patient on 7/26). She was told her echo showed she had fluid on her heart and she was started on lasix, but no follow up plan was discussed for this.  I have discussed the results of her echo/ myoview with her today. She has started feeling worse with the addition of hydralazine. She did go to Dr. Josefa Half for a 2nd opinion as she questioned her care here. She reports Dr. Josefa Half placed her on metoprolol succ 25 mg daily. She was placed on a 24 hour BP monitor on 05/21/15- results are still pending at this time (no BP readings reported from home). I advised that patient that with her questions/ concerns/ symptoms that could be related to her medications, that she would probably benefit most from a follow up appointment with Dr. Rockey Situ. I explained that he and his nurse are out of the office this week and they would be the ones to work her in the schedule. She is aware that I am forwarding this to Christell Faith- PA and Dr. Rockey Situ to review and that we will be back in touch with her as soon as possible. She voices understanding.

## 2015-05-26 NOTE — Telephone Encounter (Signed)
Happy to see her in clinic if she would like to discuss all of her questions

## 2015-05-26 NOTE — Telephone Encounter (Signed)
Patient called and wants to understand her medications and why she is on them. Please call Thanks!

## 2015-05-27 NOTE — Telephone Encounter (Signed)
S/w pt regarding recommendations for f/u visit. Pt is agreeable and would like appt with Dr. Rockey Situ. Forward to scheduling

## 2015-05-27 NOTE — Telephone Encounter (Signed)
It was noted on echo that she would need yearly follow up. She in fact did not have "fluid on on her." Her pressure right sided pressure was elevated which was why she was placed on Lasix. She has multiple medication intolerances to antihypertensives. Would be happy to see back in clinic to revisit her concerns as it appears there has been confusion.

## 2015-05-28 NOTE — Telephone Encounter (Signed)
Called pt to schedule appt with Dr. Rockey Situ, states yesterday she went to see Dr. Saralyn Pilar, and he placed a monitor on her which recoreded elevated BP. She states she does not need to see Dr. Rockey Situ.

## 2015-05-28 NOTE — Telephone Encounter (Signed)
Forward to Dr. Rockey Situ and Christell Faith

## 2015-06-15 ENCOUNTER — Encounter: Payer: Self-pay | Admitting: Family Medicine

## 2015-06-15 ENCOUNTER — Ambulatory Visit (INDEPENDENT_AMBULATORY_CARE_PROVIDER_SITE_OTHER): Payer: PPO | Admitting: Family Medicine

## 2015-06-15 VITALS — BP 118/52 | HR 68 | Temp 98.0°F | Resp 16 | Wt 165.0 lb

## 2015-06-15 DIAGNOSIS — K5792 Diverticulitis of intestine, part unspecified, without perforation or abscess without bleeding: Secondary | ICD-10-CM

## 2015-06-15 LAB — POCT URINALYSIS DIPSTICK
Bilirubin, UA: NEGATIVE
Blood, UA: NEGATIVE
Glucose, UA: NEGATIVE
Ketones, UA: NEGATIVE
Leukocytes, UA: NEGATIVE
Nitrite, UA: NEGATIVE
Protein, UA: NEGATIVE
Spec Grav, UA: 1.025
Urobilinogen, UA: 0.2
pH, UA: 6

## 2015-06-15 MED ORDER — ESTRADIOL 0.5 MG PO TABS: 0.5000 mg | ORAL_TABLET | Freq: Every day | ORAL | Status: DC

## 2015-06-15 MED ORDER — DOXYCYCLINE HYCLATE 100 MG PO TABS: 100.0000 mg | ORAL_TABLET | Freq: Two times a day (BID) | ORAL | Status: DC

## 2015-06-15 NOTE — Progress Notes (Signed)
Patient ID: Jessica Armstrong, female   DOB: Feb 16, 1946, 69 y.o.   MRN: [id number]   Jessica Armstrong  MRN: [id number] DOB: 01-May-1946  Subjective:  Abdominal Pain This is a recurrent problem. The current episode started in the past 7 days. The onset quality is sudden. The problem occurs constantly. The problem has been gradually worsening. The pain is located in the generalized abdominal region. The quality of the pain is aching and a sensation of fullness. The abdominal pain does not radiate. Associated symptoms include nausea. Nothing aggravates the pain. The pain is relieved by nothing.  Patient reports that she has had abdominal pain over the last 4 days. She reports that she occasionally has diverticulitis, and says that Doxycyline usually helps with this. She reports that she called over the weekend and they prescribed Cipro. Patient reports that she can not take Cipro because it makes her vomit.   Patient Active Problem List   Diagnosis Date Noted  . Carotid artery stenosis   . Chronic diastolic CHF (congestive heart failure)   . Fatigue 04/19/2015  . Anxiety 04/19/2015  . Allergic rhinitis 04/19/2015  . Arthritis 04/19/2015  . Disorder of carbohydrate transport and metabolism 04/19/2015  . Diverticulitis 04/19/2015  . Acid reflux 04/19/2015  . Hormone replacement therapy (postmenopausal) 04/19/2015  . Hypercholesteremia 04/19/2015  . Blood glucose elevated 04/19/2015  . Neuropathy 04/19/2015  . Pericarditis 04/19/2015  . Common wart 04/19/2015  . Hot flashes 06/10/2013  . Hemoptysis 04/21/2013  . Chest pain 04/08/2013  . SOB (shortness of breath) 04/08/2013  . Cough 04/08/2013  . Murmur 04/08/2013  . Essential hypertension 04/08/2013    Past Medical History  Diagnosis Date  . Hypercholesterolemia   . Post-menopausal   . Hypertension   . Claudication   . Hyperglycemia   . Cystitis   . Palpitations   . Neuropathy   . Colon polyp   . History of measles   . History of  mumps   . Pulmonary valve dysplasia   . PONV (postoperative nausea and vomiting)     severe n/v after anesthesia  . Seasonal allergies   . H/O vertigo   . GERD (gastroesophageal reflux disease)   . Depression     situational  . Anemia     mild; no medical treatment  . Arthritis   . Need for SBE (subacute bacterial endocarditis) prophylaxis   . Tricuspid regurgitation     a. echo 04/2013: EF 55-60%, nl wall motion, mild to mod TR, PASP 35 mm Hg  . Cervical spondyloarthritis   . Carotid artery stenosis     a. doppler 0/2334: RICA 35-68%, LICA 6-16% f/u 1 year  . Chronic diastolic CHF (congestive heart failure)     a. echo 04/2015: EF 55-60%, no RWMA, mitral valve w/ systolic bowing w/o prolapse, PASP 53 mmHg    Social History   Social History  . Marital Status: Married    Spouse Name: N/A  . Number of Children: N/A  . Years of Education: N/A   Occupational History  . Not on file.   Social History Main Topics  . Smoking status: Never Smoker   . 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 Alcohol Use: Yes     Comment: rare use 1-2 drinks per tyear.   . Drug Use: No  . Sexual Activity: Not on file   Other Topics Concern  . Not on  file   Social History Narrative    Outpatient Prescriptions Prior to Visit  Medication Sig Dispense Refill  . hydrALAZINE (APRESOLINE) 50 MG tablet Take 1 tablet (50 mg total) by mouth 3 (three) times daily. 270 tablet 3  . omeprazole (PRILOSEC) 40 MG capsule Take by mouth.    . verapamil (CALAN-SR) 180 MG CR tablet Take 1 tablet (180 mg total) by mouth at bedtime. 30 tablet 0  . albuterol (PROAIR HFA) 108 (90 BASE) MCG/ACT inhaler Inhale into the lungs.    . Blood Pressure Monitor KIT 1 Device by Does not apply route as directed. 1 each 0  . diazepam (VALIUM) 2 MG tablet Take by mouth.    . doxycycline (VIBRA-TABS) 100 MG tablet Take 1 tablet (100 mg total) by mouth 2 (two) times daily.  (Patient not taking: Reported on 06/15/2015) 20 tablet 0  . estradiol (ESTRACE) 0.5 MG tablet Take by mouth.    . fluconazole (DIFLUCAN) 150 MG tablet Take by mouth.    . fluticasone (FLONASE) 50 MCG/ACT nasal spray Place into the nose.    . furosemide (LASIX) 20 MG tablet Take 1 tablet (20 mg total) by mouth daily. (Patient not taking: Reported on 06/15/2015) 30 tablet 5  . HYDROcodone-acetaminophen (NORCO/VICODIN) 5-325 MG per tablet Take by mouth.    . loratadine (CLARITIN) 10 MG tablet Take by mouth.    . meclizine (ANTIVERT) 25 MG tablet Take by mouth.    . meloxicam (MOBIC) 15 MG tablet Take by mouth.    . Multiple Vitamin (MULTIVITAMIN) tablet Take 1 tablet by mouth daily.    . MULTIPLE VITAMIN PO Take by mouth.    Marland Kitchen PARoxetine (PAXIL) 20 MG tablet Take by mouth.    . promethazine (PHENERGAN) 25 MG tablet Take by mouth.     No facility-administered medications prior to visit.    Allergies  Allergen Reactions  . Ciprofloxacin     Vomiting   . Codeine Sulfate Nausea And Vomiting  . Keflex [Cephalexin] Nausea And Vomiting  . Macrobid [Nitrofurantoin Macrocrystal] Nausea And Vomiting  . Sulfa Antibiotics Nausea And Vomiting    Review of Systems  Eyes: Negative.   Respiratory: Negative.   Cardiovascular: Negative.   Gastrointestinal: Positive for nausea and abdominal pain.  Genitourinary:       Significant issues with hot flashes.  Endo/Heme/Allergies: Negative.   Psychiatric/Behavioral: Negative.    Objective:  BP 118/52 mmHg  Pulse 68  Temp(Src) 98 F (36.7 C)  Resp 16  Wt 165 lb (74.844 kg)  Physical Exam  Constitutional: She is oriented to person, place, and time and well-developed, well-nourished, and in no distress.  HENT:  Head: Normocephalic and atraumatic.  Right Ear: External ear normal.  Left Ear: External ear normal.  Nose: Nose normal.  Eyes: Conjunctivae are normal.  Neck: Neck supple.  Cardiovascular: Normal rate, regular rhythm and normal heart  sounds.   Pulmonary/Chest: Effort normal and breath sounds normal.  Abdominal: Soft.  Minimal left lower quadrant greater than right lower quadrant tenderness. No guarding or rebound.  Neurological: She is alert and oriented to person, place, and time.  Skin: Skin is warm and dry.  Psychiatric: Mood, memory, affect and judgment normal.    Assessment and Plan :  No diagnosis found. Abdominal Pain/presumptive Diverticulitis Doxycycline   For 1 week. GI referral or CT of abdomen/pelvis  Menopausal Symptoms Try Estradiol 0.5 mg daily.RTC 2 months [facility] 06/15/2015 4:42 PM

## 2015-07-13 ENCOUNTER — Ambulatory Visit: Payer: PPO | Admitting: Family Medicine

## 2015-09-21 ENCOUNTER — Other Ambulatory Visit: Payer: Self-pay | Admitting: Family Medicine

## 2015-09-22 NOTE — Telephone Encounter (Signed)
Pharmacy requesting refill.

## 2015-11-04 ENCOUNTER — Other Ambulatory Visit: Payer: Self-pay | Admitting: Family Medicine

## 2015-11-04 MED ORDER — AZITHROMYCIN 250 MG PO TABS
ORAL_TABLET | ORAL | Status: DC
Start: 2015-11-04 — End: 2015-11-23

## 2015-11-04 NOTE — Telephone Encounter (Signed)
Azithromycin, 250 mg, 4 one hour before procedure. 3 refill

## 2015-11-04 NOTE — Telephone Encounter (Signed)
Please review. Ok to send in abx?

## 2015-11-04 NOTE — Telephone Encounter (Signed)
Advised patient as below. Meds sent into the pharmacy.

## 2015-11-04 NOTE — Telephone Encounter (Signed)
Pt stated that she has to go to the dentist on Monday 11/08/15 and that when she goes Dr. Rosanna Randy always send her in an antibiotic to [facility]. Thanks TNP

## 2015-11-23 ENCOUNTER — Ambulatory Visit (INDEPENDENT_AMBULATORY_CARE_PROVIDER_SITE_OTHER): Payer: PPO | Admitting: Family Medicine

## 2015-11-23 ENCOUNTER — Ambulatory Visit
Admission: RE | Admit: 2015-11-23 | Discharge: 2015-11-23 | Disposition: A | Discharge status: Home / Self Care | Payer: PPO | Source: Ambulatory Visit | Attending: Family Medicine | Admitting: Family Medicine

## 2015-11-23 VITALS — BP 134/52 | HR 66 | Temp 97.7°F | Resp 12 | Wt 172.0 lb

## 2015-11-23 DIAGNOSIS — M2550 Pain in unspecified joint: Secondary | ICD-10-CM

## 2015-11-23 DIAGNOSIS — R1013 Epigastric pain: Secondary | ICD-10-CM

## 2015-11-23 DIAGNOSIS — G629 Polyneuropathy, unspecified: Secondary | ICD-10-CM

## 2015-11-23 MED ORDER — PANTOPRAZOLE SODIUM 40 MG PO TBEC: 40.0000 mg | DELAYED_RELEASE_TABLET | Freq: Two times a day (BID) | ORAL | Status: DC

## 2015-11-23 NOTE — Progress Notes (Signed)
Patient ID: Jessica Armstrong, female   DOB: 04-23-1946, 70 y.o.   MRN: [id number]    Subjective:  HPI  Patient has had problem with joint pain for 2 weeks now, off and on no certain pattern. Pain may come on sitting, standing doing dishes for example. Pain described as stabbing and tight, lasts for a few minutes. Pain is located right hip, across the back area, under breast area, right arm. Pain does not happen in all the area at the same time. She has been using Motrin. She is not having abdominal pain, no nausea, no fever, no urinary issues. She does state when she has this pain it is so severe she has hard time breathing.  She does mention that she saw specialist for her hands and was told that her joint were basically bone on bone and needs a surgery but patient does not want to do that and will wait. She is not sure if it is something related.  Prior to Admission medications   Medication Sig Start Date End Date Taking? Authorizing Provider  albuterol (PROAIR HFA) 108 (90 BASE) MCG/ACT inhaler Inhale into the lungs. 12/01/14  Yes Historical Provider, MD  Blood Pressure Monitor KIT 1 Device by Does not apply route as directed. 05/03/15  Yes Ryan M Dunn, PA-C  diazepam (VALIUM) 2 MG tablet Take by mouth. 12/01/14  Yes Historical Provider, MD  estradiol (ESTRACE) 0.5 MG tablet Take 1 tablet (0.5 mg total) by mouth daily. 06/15/15  Yes Richard Maceo Pro., MD  fluconazole (DIFLUCAN) 150 MG tablet Take by mouth. 02/11/15  Yes Historical Provider, MD  fluticasone (FLONASE) 50 MCG/ACT nasal spray Place into the nose. 05/04/14  Yes Historical Provider, MD  furosemide (LASIX) 20 MG tablet Take 1 tablet (20 mg total) by mouth daily. 05/03/15  Yes Ryan M Dunn, PA-C  hydrALAZINE (APRESOLINE) 50 MG tablet Take 1 tablet (50 mg total) by mouth 3 (three) times daily. 05/12/15  Yes Ryan M Dunn, PA-C  HYDROcodone-acetaminophen (NORCO/VICODIN) 5-325 MG per tablet Take by mouth. 09/11/14  Yes Historical Provider, MD    loratadine (CLARITIN) 10 MG tablet Take by mouth. 06/01/14  Yes Historical Provider, MD  meclizine (ANTIVERT) 25 MG tablet Take by mouth. 12/01/14  Yes Historical Provider, MD  meloxicam (MOBIC) 15 MG tablet Take by mouth. 06/02/14  Yes Historical Provider, MD  metoprolol succinate (TOPROL-XL) 25 MG 24 hr tablet Take 1 tablet by mouth daily. 05/21/15 05/20/16 Yes Historical Provider, MD  Multiple Vitamin (MULTIVITAMIN) tablet Take 1 tablet by mouth daily.   Yes Historical Provider, MD  omeprazole (PRILOSEC) 40 MG capsule TAKE 1 CAPSULE EVERY DAY 09/22/15  Yes Jerrol Banana., MD  PARoxetine (PAXIL) 20 MG tablet Take by mouth. 12/01/14  Yes Historical Provider, MD  promethazine (PHENERGAN) 25 MG tablet Take by mouth. 09/11/14  Yes Historical Provider, MD  triamterene-hydrochlorothiazide (DYAZIDE) 37.5-25 MG per capsule Take 1 capsule by mouth daily. 06/01/15 05/31/16 Yes Historical Provider, MD  verapamil (CALAN-SR) 180 MG CR tablet Take 1 tablet (180 mg total) by mouth at bedtime. 05/11/15  Yes Rise Mu, PA-C    Patient Active Problem List   Diagnosis Date Noted  . Carotid artery stenosis   . Chronic diastolic CHF (congestive heart failure) ([place])   . Fatigue 04/19/2015  . Anxiety 04/19/2015  . Allergic rhinitis 04/19/2015  . Arthritis 04/19/2015  . Disorder of carbohydrate transport and metabolism ([place]) 04/19/2015  . Diverticulitis 04/19/2015  . Acid reflux 04/19/2015  .  Hormone replacement therapy (postmenopausal) 04/19/2015  . Hypercholesteremia 04/19/2015  . Blood glucose elevated 04/19/2015  . Neuropathy (H[facility]) 04/19/2015  . Pericarditis 04/19/2015  . Common wart 04/19/2015  . Hot flashes 06/10/2013  . Hemoptysis 04/21/2013  . Chest pain 04/08/2013  . SOB (shortness of breath) 04/08/2013  . Cough 04/08/2013  . Murmur 04/08/2013  . Essential hypertension 04/08/2013    Past Medical History  Diagnosis Date  . Hypercholesterolemia   . Post-menopausal   . Hypertension   .  Claudication   . Hyperglycemia   . Cystitis   . Palpitations   . Neuropathy   . Colon polyp   . History of measles   . History of mumps   . Pulmonary valve dysplasia   . PONV (postoperative nausea and vomiting)     severe n/v after anesthesia  . Seasonal allergies   . H/O vertigo   . GERD (gastroesophageal reflux disease)   . Depression     situational  . Anemia     mild; no medical treatment  . Arthritis   . Need for SBE (subacute bacterial endocarditis) prophylaxis   . Tricuspid regurgitation     a. echo 04/2013: EF 55-60%, nl wall motion, mild to mod TR, PASP 35 mm Hg  . Cervical spondyloarthritis   . Carotid artery stenosis     a. doppler 04/2015: RICA 40-59%, LICA 1-39% f/u 1 year  . Chronic diastolic CHF (congestive heart failure)     a. echo 04/2015: EF 55-60%, no RWMA, mitral valve w/ systolic bowing w/o prolapse, PASP 53 mmHg    Social History   Social History  . Marital Status: Married    Spouse Name: N/A  . Number of Children: N/A  . Years of Education: N/A   Occupational History  . Not on file.   Social History Main Topics  . Smoking status: Never Smoker   . 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 Alcohol Use: Yes     Comment: rare use 1-2 drinks per tyear.   . Drug Use: No  . Sexual Activity: Not on file   Other Topics Concern  . Not on file   Social History Narrative    Allergies  Allergen Reactions  . Ciprofloxacin     Vomiting   . Codeine Sulfate Nausea And Vomiting  . Keflex [Cephalexin] Nausea And Vomiting  . Macrobid [Nitrofurantoin Macrocrystal] Nausea And Vomiting  . Sulfa Antibiotics Nausea And Vomiting    Review of Systems  Constitutional: Negative.   Respiratory: Negative.   Cardiovascular: Negative.   Musculoskeletal: Positive for myalgias, back pain and joint pain.  Psychiatric/Behavioral: Negative.     Immunization History  Administered Date(s) Administered  .  Influenza Whole 07/09/2012  . Influenza-Unspecified 09/10/2015  . Pneumococcal Polysaccharide-23 11/23/2011   Objective:  BP 134/52 mmHg  Pulse 66  Temp(Src) 97.7 F (36.5 C)  Resp 12  Wt 172 lb (78.019 kg)  Physical Exam  Constitutional: She is oriented to person, place, and time and well-developed, well-nourished, and in no distress.  HENT:  Head: Normocephalic and atraumatic.  Right Ear: External ear normal.  Left Ear: External ear normal.  Eyes: Conjunctivae are normal. Pupils are equal, round, and reactive to light.  Neck: Normal range of motion. Neck supple.  Cardiovascular: Normal rate, regular rhythm, normal heart sounds and intact distal pulses.   No murmur heard. Pulmonary/Chest: Effort normal and breath sounds normal.  No respiratory distress. She has no wheezes.  Abdominal: Soft. She exhibits no mass. Tenderness: mild epigastric tenderness. There is no guarding.  No CVAT  Neurological: She is alert and oriented to person, place, and time.  Skin: Skin is warm and dry. No rash noted. No erythema.  Psychiatric: Mood, memory, affect and judgment normal.    Lab Results  Component Value Date   WBC 12.5* 04/23/2015   HGB 12.6 02/11/2015   HCT 37.0 04/23/2015   PLT 279 04/23/2015   GLUCOSE 157* 05/12/2015   CHOL 198 12/01/2014   TRIG 157 12/01/2014   HDL 61 12/01/2014   LDLCALC 106 12/01/2014   TSH 2.99 12/01/2014    CMP     Component Value Date/Time   NA 141 05/12/2015 1336   NA 146* 05/10/2015 1023   K 4.3 05/12/2015 1336   CL 111 05/12/2015 1336   CO2 23 05/12/2015 1336   GLUCOSE 157* 05/12/2015 1336   GLUCOSE 119* 05/10/2015 1023   BUN 26* 05/12/2015 1336   BUN 24 05/10/2015 1023   CREATININE 1.17* 05/12/2015 1336   CREATININE 0.9 12/01/2014   CALCIUM 9.1 05/12/2015 1336   PROT 6.9 04/19/2015 1249   ALBUMIN 4.4 04/19/2015 1249   AST 18 04/19/2015 1249   ALT 19 04/19/2015 1249   ALKPHOS 100 04/19/2015 1249   BILITOT 0.3 04/19/2015 1249   GFRNONAA  46* 05/12/2015 1336   GFRAA 53* 05/12/2015 1336    Assessment and Plan :  1. Arthralgia New. No specific pattern. Could be multifactorial issue.Unknown etiology--symptoms very vague.  2. Epigastric pain Mild tenderness on the exam. Will stop Omeprazole and try Pantoprazole. She did well on Nexium but insurance did not cover it. Question kidney stones. I think this is probably gastritis related issue. Will check KUB. Check labs pending results. Will re check in 10 to 14 days and may need referral if symptoms are not better.  3. Neuropathy ([place])    Miguel Aschoff MD [facility] 11/23/2015 4:00 PM

## 2015-11-24 ENCOUNTER — Other Ambulatory Visit: Payer: Self-pay

## 2015-11-24 LAB — COMPREHENSIVE METABOLIC PANEL
ALT: 11 IU/L (ref 0–32)
AST: 14 IU/L (ref 0–40)
Albumin/Globulin Ratio: 1.8 (ref 1.1–2.5)
Albumin: 4.2 g/dL (ref 3.5–4.8)
Alkaline Phosphatase: 91 IU/L (ref 39–117)
BUN/Creatinine Ratio: 24 (ref 11–26)
BUN: 20 mg/dL (ref 8–27)
Bilirubin Total: <0.2 mg/dL (ref 0.0–1.2)
CO2: 21 mmol/L (ref 18–29)
Calcium: 9.3 mg/dL (ref 8.7–10.3)
Chloride: 105 mmol/L (ref 96–106)
Creatinine, Ser: 0.84 mg/dL (ref 0.57–1.00)
GFR calc Af Amer: 81 mL/min/{1.73_m2} (ref >59)
GFR calc non Af Amer: 70 mL/min/{1.73_m2} (ref >59)
Globulin, Total: 2.3 g/dL (ref 1.5–4.5)
Glucose: 130 mg/dL — ABNORMAL HIGH (ref 65–99)
Potassium: 4.9 mmol/L (ref 3.5–5.2)
Sodium: 143 mmol/L (ref 134–144)
Total Protein: 6.5 g/dL (ref 6.0–8.5)

## 2015-11-24 LAB — CBC WITH DIFFERENTIAL/PLATELET
Basophils Absolute: 0 10*3/uL (ref 0.0–0.2)
Basos: 0 %
EOS (ABSOLUTE): 0.2 10*3/uL (ref 0.0–0.4)
Eos: 2 %
Hematocrit: 36.1 % (ref 34.0–46.6)
Hemoglobin: 11.9 g/dL (ref 11.1–15.9)
Immature Grans (Abs): 0 10*3/uL (ref 0.0–0.1)
Immature Granulocytes: 0 %
Lymphocytes Absolute: 3 10*3/uL (ref 0.7–3.1)
Lymphs: 33 %
MCH: 30.8 pg (ref 26.6–33.0)
MCHC: 33 g/dL (ref 31.5–35.7)
MCV: 94 fL (ref 79–97)
Monocytes Absolute: 1.1 10*3/uL — ABNORMAL HIGH (ref 0.1–0.9)
Monocytes: 12 %
Neutrophils Absolute: 4.8 10*3/uL (ref 1.4–7.0)
Neutrophils: 53 %
Platelets: 300 10*3/uL (ref 150–379)
RBC: 3.86 x10E6/uL (ref 3.77–5.28)
RDW: 13.2 % (ref 12.3–15.4)
WBC: 9.2 10*3/uL (ref 3.4–10.8)

## 2015-11-24 LAB — H. PYLORI ANTIBODY, IGG: H Pylori IgG: <0.9 U/mL (ref 0.0–0.8)

## 2015-11-24 LAB — SEDIMENTATION RATE: Sed Rate: 16 mm/hr (ref 0–40)

## 2015-11-24 LAB — LIPASE: Lipase: 36 U/L (ref 0–59)

## 2015-11-24 MED ORDER — PANTOPRAZOLE SODIUM 40 MG PO TBEC: 40.0000 mg | DELAYED_RELEASE_TABLET | Freq: Two times a day (BID) | ORAL | Status: DC

## 2015-12-07 ENCOUNTER — Ambulatory Visit (INDEPENDENT_AMBULATORY_CARE_PROVIDER_SITE_OTHER): Payer: PPO | Admitting: Family Medicine

## 2015-12-07 ENCOUNTER — Encounter: Payer: Self-pay | Admitting: Family Medicine

## 2015-12-07 VITALS — BP 126/76 | HR 72 | Temp 97.9°F | Resp 16 | Ht 66.5 in | Wt 170.0 lb

## 2015-12-07 DIAGNOSIS — R739 Hyperglycemia, unspecified: Secondary | ICD-10-CM | POA: Diagnosis not present

## 2015-12-07 DIAGNOSIS — Z1239 Encounter for other screening for malignant neoplasm of breast: Secondary | ICD-10-CM

## 2015-12-07 DIAGNOSIS — Z23 Encounter for immunization: Secondary | ICD-10-CM | POA: Diagnosis not present

## 2015-12-07 DIAGNOSIS — Z0000 Encounter for general adult medical examination without abnormal findings: Secondary | ICD-10-CM

## 2015-12-07 DIAGNOSIS — R232 Flushing: Secondary | ICD-10-CM

## 2015-12-07 DIAGNOSIS — R109 Unspecified abdominal pain: Secondary | ICD-10-CM

## 2015-12-07 DIAGNOSIS — Z1211 Encounter for screening for malignant neoplasm of colon: Secondary | ICD-10-CM | POA: Diagnosis not present

## 2015-12-07 DIAGNOSIS — N951 Menopausal and female climacteric states: Secondary | ICD-10-CM | POA: Diagnosis not present

## 2015-12-07 DIAGNOSIS — Z8719 Personal history of other diseases of the digestive system: Secondary | ICD-10-CM | POA: Diagnosis not present

## 2015-12-07 LAB — IFOBT (OCCULT BLOOD): IFOBT: NEGATIVE

## 2015-12-07 MED ORDER — ALBUTEROL SULFATE HFA 108 (90 BASE) MCG/ACT IN AERS: 1.0000 | INHALATION_SPRAY | RESPIRATORY_TRACT | Status: DC | PRN

## 2015-12-07 MED ORDER — ESTRADIOL 0.5 MG PO TABS: 0.5000 mg | ORAL_TABLET | Freq: Every day | ORAL | Status: DC

## 2015-12-07 MED ORDER — PAROXETINE HCL 20 MG PO TABS
20.0000 mg | ORAL_TABLET | Freq: Every day | ORAL | Status: DC
Start: 2015-12-07 — End: 2017-06-15

## 2015-12-07 MED ORDER — HYDRALAZINE HCL 50 MG PO TABS: 50.0000 mg | ORAL_TABLET | Freq: Three times a day (TID) | ORAL | Status: DC

## 2015-12-07 MED ORDER — MECLIZINE HCL 25 MG PO TABS: 25.0000 mg | ORAL_TABLET | Freq: Every day | ORAL | Status: DC | PRN

## 2015-12-07 MED ORDER — LORATADINE 10 MG PO TABS: 10.0000 mg | ORAL_TABLET | Freq: Every day | ORAL | Status: DC

## 2015-12-07 MED ORDER — VERAPAMIL HCL ER 180 MG PO TBCR: 180.0000 mg | EXTENDED_RELEASE_TABLET | Freq: Two times a day (BID) | ORAL | Status: DC

## 2015-12-07 MED ORDER — DIAZEPAM 2 MG PO TABS: 2.0000 mg | ORAL_TABLET | Freq: Three times a day (TID) | ORAL | Status: DC | PRN

## 2015-12-07 MED ORDER — METOPROLOL SUCCINATE ER 25 MG PO TB24: 25.0000 mg | ORAL_TABLET | Freq: Every day | ORAL | Status: DC

## 2015-12-07 MED ORDER — TRIAMTERENE-HCTZ 37.5-25 MG PO CAPS: 1.0000 | ORAL_CAPSULE | Freq: Every day | ORAL | Status: DC

## 2015-12-07 NOTE — Progress Notes (Signed)
Patient ID: Jessica Armstrong, female   DOB: [birth year], 70 y.o.   MRN: [id number]  Visit Date: 12/07/2015  Today's Provider: Wilhemena Durie, MD   Chief Complaint  Patient presents with  . Medicare Wellness   Subjective:   Jessica Armstrong is a 70 y.o. female who presents today for her Subsequent Annual Wellness Visit. She feels well. She reports exercising walks daily. She reports she is sleeping well. Immunization History  Administered Date(s) Administered  . Influenza Whole 07/09/2012  . Influenza-Unspecified 09/10/2015  . Pneumococcal Polysaccharide-23 11/23/2011   LAST Colonoscopy 07/18/13 1 polyp-diverticulosis-repeat 07/2018  Pap smear 10/25/09 had hysterectomy due to endometriosis.  Mammogram 02/20/14   Review of Systems  Constitutional: Negative.   HENT: Negative.   Eyes: Negative.   Respiratory: Negative.   Cardiovascular: Negative.   Gastrointestinal: Positive for diarrhea.       Patient complains of nocturnal indigestion. She continues to have right flank pain. There are no aggravating or alleviating circumstances.  Endocrine: Negative.   Genitourinary:       Urinary incontinence  Musculoskeletal: Positive for myalgias.  Skin: Negative.   Allergic/Immunologic: Negative.   Neurological: Negative.   Hematological: Negative.   Psychiatric/Behavioral: Negative.     Patient Active Problem List   Diagnosis Date Noted  . Carotid artery stenosis   . Chronic diastolic CHF (congestive heart failure) ([place])   . Fatigue 04/19/2015  . Anxiety 04/19/2015  . Allergic rhinitis 04/19/2015  . Arthritis 04/19/2015  . Disorder of carbohydrate transport and metabolism ([place]) 04/19/2015  . Diverticulitis 04/19/2015  . Acid reflux 04/19/2015  . Hormone replacement therapy (postmenopausal) 04/19/2015  . Hypercholesteremia 04/19/2015  . Blood glucose elevated 04/19/2015  . Neuropathy ([place]) 04/19/2015  . Pericarditis 04/19/2015  . Common wart 04/19/2015  . Hot flashes 06/10/2013   . Hemoptysis 04/21/2013  . Chest pain 04/08/2013  . SOB (shortness of breath) 04/08/2013  . Cough 04/08/2013  . Murmur 04/08/2013  . Essential hypertension 04/08/2013    Social History   Social History  . Marital Status: Married    Spouse Name: N/A  . Number of Children: N/A  . Years of Education: N/A   Occupational History  . Not on file.   Social History Main Topics  . Smoking status: Never Smoker   . 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 Alcohol Use: Yes     Comment: rare use 1-2 drinks per year.   . Drug Use: No  . Sexual Activity: Not on file   Other Topics Concern  . Not on file   Social History Narrative    Past Surgical History  Procedure Laterality Date  . Cholecystectomy    . Abdominal hysterectomy    . Anterior cervical decomp/discectomy fusion N/A 08/26/2013    Procedure: CERVICAL SIX TO SEVEN ANTERIOR CERVICAL DECOMPRESSION/DISCECTOMY FUSION 1 LEVEL;  Surgeon: Erline Levine, MD;  Location: [facility];  Service: Neurosurgery;  Laterality: N/A;  C6-7 Anterior cervical decompression/diskectomy/fusion  . Eye surgery      benign tumor of eye    Her family history includes Asthma in her maternal grandfather; COPD in her mother; Colon cancer in her father; Heart disease in her mother; Hypertension in her brother and mother.    Outpatient Prescriptions Prior to Visit  Medication Sig Dispense Refill  . albuterol (PROAIR HFA) 108 (90 BASE) MCG/ACT inhaler Inhale into the lungs.    . diazepam (VALIUM) 2  MG tablet Take by mouth.    . estradiol (ESTRACE) 0.5 MG tablet Take 1 tablet (0.5 mg total) by mouth daily. 30 tablet 5  . fluconazole (DIFLUCAN) 150 MG tablet Take by mouth.    . hydrALAZINE (APRESOLINE) 50 MG tablet Take 1 tablet (50 mg total) by mouth 3 (three) times daily. 270 tablet 3  . loratadine (CLARITIN) 10 MG tablet Take by mouth.    . meclizine (ANTIVERT) 25 MG tablet Take by mouth.     . metoprolol succinate (TOPROL-XL) 25 MG 24 hr tablet Take 1 tablet by mouth daily.    . Multiple Vitamin (MULTIVITAMIN) tablet Take 1 tablet by mouth daily.    . pantoprazole (PROTONIX) 40 MG tablet Take 1 tablet (40 mg total) by mouth 2 (two) times daily. 180 tablet 3  . PARoxetine (PAXIL) 20 MG tablet Take by mouth.    . triamterene-hydrochlorothiazide (DYAZIDE) 37.5-25 MG per capsule Take 1 capsule by mouth daily.    . verapamil (CALAN-SR) 180 MG CR tablet Take 1 tablet (180 mg total) by mouth at bedtime. 30 tablet 0  . Blood Pressure Monitor KIT 1 Device by Does not apply route as directed. 1 each 0  . fluticasone (FLONASE) 50 MCG/ACT nasal spray Place into the nose.    . furosemide (LASIX) 20 MG tablet Take 1 tablet (20 mg total) by mouth daily. 30 tablet 5  . HYDROcodone-acetaminophen (NORCO/VICODIN) 5-325 MG per tablet Take by mouth.    . meloxicam (MOBIC) 15 MG tablet Take by mouth.    . promethazine (PHENERGAN) 25 MG tablet Take by mouth.     No facility-administered medications prior to visit.    Allergies  Allergen Reactions  . Ciprofloxacin     Vomiting   . Codeine Sulfate Nausea And Vomiting  . Keflex [Cephalexin] Nausea And Vomiting  . Macrobid [Nitrofurantoin Macrocrystal] Nausea And Vomiting  . Sulfa Antibiotics Nausea And Vomiting    Patient Care Team: Jerrol Banana., MD as PCP - General (Family Medicine)  Objective:   Vitals:  Filed Vitals:   12/07/15 0934  BP: 126/76  Pulse: 72  Temp: 97.9 F (36.6 C)  Resp: 16  Height: 5' 6.5" (1.689 m)  Weight: 170 lb (77.111 kg)    Physical Exam  Constitutional: She is oriented to person, place, and time. She appears well-developed and well-nourished.  HENT:  Head: Normocephalic and atraumatic.  Right Ear: External ear normal.  Left Ear: External ear normal.  Eyes: Conjunctivae are normal. Pupils are equal, round, and reactive to light.  Neck: Normal range of motion. Neck supple.  Cardiovascular:  Normal rate, regular rhythm, normal heart sounds and intact distal pulses.   No murmur heard. Pulmonary/Chest: Effort normal and breath sounds normal.  Abdominal: Soft. She exhibits no mass. There is no rebound and no guarding.  Genitourinary: Vagina normal. Guaiac negative stool. No vaginal discharge found.  Musculoskeletal: Normal range of motion. She exhibits no edema or tenderness.  Neurological: She is alert and oriented to person, place, and time. No cranial nerve deficit. Coordination normal.  Skin: Skin is warm and dry. No rash noted. No erythema.  Psychiatric: She has a normal mood and affect. Her behavior is normal. Judgment and thought content normal.    Activities of Daily Living In your present state of health, do you have any difficulty performing the following activities: 12/07/2015 06/15/2015  Hearing? N N  Vision? N N  Difficulty concentrating or making decisions? N N  Walking or climbing  stairs? N N  Dressing or bathing? N N  Doing errands, shopping? N N    Fall Risk Assessment Fall Risk  12/07/2015 06/15/2015  Falls in the past year? No No     Depression Screen PHQ 2/9 Scores 12/07/2015 06/15/2015  PHQ - 2 Score 0 0    Cognitive Testing - 6-CIT    Year: 0 4 points  Month: 0 3 points  Memorize "Pia Mau, [address] [place]"  Time (within 1 hour:) 0 3 points  Count backwards from 20: 0 2 4 points  Name months of year: 0 2 4 points  Repeat Address: 0 _0 points   Total Score: 0/28  Interpretation : Normal (0-7) Abnormal (8-28)    Assessment & Plan:     Annual Wellness Visit  Reviewed patient's Family Medical History Reviewed and updated list of patient's medical providers Assessment of cognitive impairment was done Assessed patient's functional ability Established a written schedule for health screening [place] Completed and Reviewed 1. Medicare annual wellness visit, subsequent Patient wanted to add a visit, checking  labs today. She had hysterectomy no pap smear needed. By manual was normal. Nurse advised patient that use of Estrace has potential risks and that we need to address this before she needs another refill. Patient understood and will call for appointment. Discussed use of Diazepam patient states she uses this for TMJ flare ups only and she knows to come in for appointment to discuss this also before she needs any more refills.   2. Colon cancer screening Negative today. - IFOBT POC (occult bld, rslt in office)  3. Abdominal discomfort Not better. Will refer to GI for consultation. Symptoms she describes could be IBS related. Will let GI follow. Labs in February including H pylori were normal. - Ambulatory referral to Gastroenterology  4. History of diverticulosis - Ambulatory referral to Gastroenterology  5. Breast cancer screening Order put in - MM Digital Screening; Future  6. Hot flashes - TSH  7. Hyperglycemia - HgB A1c - Lipid Panel With LDL/HDL Ratio  8. Need for pneumococcal vaccination Administered today. - Pneumococcal conjugate vaccine 13-valent  9. Need for Tdap vaccination Tried checking price on transactrx but her insurance does not take online claims. Advised patient to check with insurance on the price. Will follow up on the next visit.  Patient was seen and examined by Dr. Eulas Post and note was scribed by Theressa Millard, RMA.  I have done the exam and reviewed the above chart and it is accurate to the best of my knowledge.    Miguel Aschoff MD [facility] 12/07/2015 9:39 AM  ------------------------------------------------------------------------------------------------------------

## 2015-12-08 LAB — LIPID PANEL WITH LDL/HDL RATIO
Cholesterol, Total: 217 mg/dL — ABNORMAL HIGH (ref 100–199)
HDL: 53 mg/dL (ref >39)
LDL Calculated: 123 mg/dL — ABNORMAL HIGH (ref 0–99)
LDl/HDL Ratio: 2.3 ratio units (ref 0.0–3.2)
Triglycerides: 206 mg/dL — ABNORMAL HIGH (ref 0–149)
VLDL Cholesterol Cal: 41 mg/dL — ABNORMAL HIGH (ref 5–40)

## 2015-12-08 LAB — HEMOGLOBIN A1C
Est. average glucose Bld gHb Est-mCnc: 128 mg/dL
Hgb A1c MFr Bld: 6.1 % — ABNORMAL HIGH (ref 4.8–5.6)

## 2015-12-08 LAB — TSH: TSH: 5.71 u[IU]/mL — ABNORMAL HIGH (ref 0.450–4.500)

## 2015-12-09 ENCOUNTER — Emergency Department: Payer: PPO

## 2015-12-09 ENCOUNTER — Other Ambulatory Visit: Payer: Self-pay | Admitting: Nurse Practitioner

## 2015-12-09 ENCOUNTER — Emergency Department
Admission: EM | Admit: 2015-12-09 | Discharge: 2015-12-09 | Disposition: A | Discharge status: Home / Self Care | Payer: PPO | Attending: Emergency Medicine | Admitting: Emergency Medicine

## 2015-12-09 ENCOUNTER — Encounter: Payer: Self-pay | Admitting: Emergency Medicine

## 2015-12-09 DIAGNOSIS — K219 Gastro-esophageal reflux disease without esophagitis: Secondary | ICD-10-CM | POA: Diagnosis not present

## 2015-12-09 DIAGNOSIS — S40011A Contusion of right shoulder, initial encounter: Secondary | ICD-10-CM | POA: Diagnosis not present

## 2015-12-09 DIAGNOSIS — I10 Essential (primary) hypertension: Secondary | ICD-10-CM | POA: Insufficient documentation

## 2015-12-09 DIAGNOSIS — R1084 Generalized abdominal pain: Secondary | ICD-10-CM | POA: Diagnosis not present

## 2015-12-09 DIAGNOSIS — Y92531 Health care provider office as the place of occurrence of the external cause: Secondary | ICD-10-CM | POA: Diagnosis not present

## 2015-12-09 DIAGNOSIS — Z79899 Other long term (current) drug therapy: Secondary | ICD-10-CM | POA: Diagnosis not present

## 2015-12-09 DIAGNOSIS — Y9389 Activity, other specified: Secondary | ICD-10-CM | POA: Insufficient documentation

## 2015-12-09 DIAGNOSIS — R110 Nausea: Secondary | ICD-10-CM | POA: Insufficient documentation

## 2015-12-09 DIAGNOSIS — M25511 Pain in right shoulder: Secondary | ICD-10-CM | POA: Diagnosis not present

## 2015-12-09 DIAGNOSIS — S8001XA Contusion of right knee, initial encounter: Secondary | ICD-10-CM | POA: Diagnosis not present

## 2015-12-09 DIAGNOSIS — R109 Unspecified abdominal pain: Secondary | ICD-10-CM

## 2015-12-09 DIAGNOSIS — Y998 Other external cause status: Secondary | ICD-10-CM | POA: Diagnosis not present

## 2015-12-09 DIAGNOSIS — S4991XA Unspecified injury of right shoulder and upper arm, initial encounter: Secondary | ICD-10-CM | POA: Diagnosis not present

## 2015-12-09 DIAGNOSIS — R1310 Dysphagia, unspecified: Secondary | ICD-10-CM | POA: Diagnosis not present

## 2015-12-09 DIAGNOSIS — W1839XA Other fall on same level, initial encounter: Secondary | ICD-10-CM | POA: Diagnosis not present

## 2015-12-09 DIAGNOSIS — M25561 Pain in right knee: Secondary | ICD-10-CM | POA: Diagnosis not present

## 2015-12-09 DIAGNOSIS — G8929 Other chronic pain: Secondary | ICD-10-CM

## 2015-12-09 MED ORDER — OXYCODONE-ACETAMINOPHEN 5-325 MG PO TABS: 1.0000 | ORAL_TABLET | Freq: Four times a day (QID) | ORAL | Status: DC | PRN

## 2015-12-09 MED ORDER — ONDANSETRON 8 MG PO TBDP
8.0000 mg | ORAL_TABLET | Freq: Once | ORAL | Status: AC
  Administered 2015-12-09: 8 mg via ORAL
  Filled 2015-12-09: qty 1

## 2015-12-09 MED ORDER — TRAMADOL HCL 50 MG PO TABS: 50.0000 mg | ORAL_TABLET | Freq: Four times a day (QID) | ORAL | Status: DC | PRN

## 2015-12-09 MED ORDER — ONDANSETRON 4 MG PO TBDP: 4.0000 mg | ORAL_TABLET | Freq: Three times a day (TID) | ORAL | Status: DC | PRN

## 2015-12-09 MED ORDER — MELOXICAM 15 MG PO TABS: 15.0000 mg | ORAL_TABLET | Freq: Every day | ORAL | Status: DC

## 2015-12-09 MED ORDER — OXYCODONE-ACETAMINOPHEN 5-325 MG PO TABS
1.0000 | ORAL_TABLET | Freq: Once | ORAL | Status: AC
  Administered 2015-12-09: 1 via ORAL
  Filled 2015-12-09: qty 1

## 2015-12-09 NOTE — ED Notes (Signed)
Pt a/o. Right shoulder pain. No obvious deformity. + pulses. Right knee pain - skin tear top of knee cap.

## 2015-12-09 NOTE — Discharge Instructions (Signed)
Periosteal Hematoma Periosteal hematoma (bone bruise) is a localized, tender, raised area close to the bone. It can occur from a small hidden fracture of the bone, following surgery, or from other trauma to the area. It typically occurs in bones located close to the surface of the skin, such as the shin, knee, and heel bone. Although it may take 2 or more weeks to completely heal, bone bruises typically are not associated with permanent or serious damage to the bone. If you are taking blood thinners, you may be at greater risk for such injuries.  CAUSES  A bone bruise is usually caused by high-impact trauma to the bone, but it can be caused by sports injuries or twisting injuries. SIGNS AND SYMPTOMS   Severe pain around the injured area that typically lasts longer than a normal bruise.  Difficulty using the bruised area.  Tender, raised area close to the bone.  Discoloration or swelling of the bruised area. DIAGNOSIS  You may need an MRI of the injured area to confirm a bone bruise if your health care provider feels it is necessary. A regular X-ray will not detect a bone bruise, but it will detect a broken bone (fracture). An X-ray may be taken to rule out any fractures. TREATMENT  Often, the best treatment for a bone bruise is resting, icing, and applying cold compresses to the injured area. Over-the-counter medicines may also be recommended for pain control. HOME CARE INSTRUCTIONS  Some things you can do to improve the condition are:   Rest and elevate the area of injury as long as it is very tender or swollen.  Apply ice to the injured area:  Put ice in a plastic bag.  Place a towel between your skin and the bag.  Leave the ice on for 20 minutes, 2-3 times a day.  Use an elastic wrap to reduce swelling and protect the injured area. Make sure it is not applied too tightly. If the area around the wrap becomes cold or blue, the wrap is too tight. Wrap it more loosely.  For  activity:  Follow your health care provider's instructions about whether walking with crutches is required. This will depend on how serious your condition is.  Start weight bearing gradually on the bruised part.  Continue to use crutches or a cane until you can stand without causing pain, or as instructed.  If a plaster splint was applied:  Wear the splint until you are seen for a follow-up exam.  Rest it on nothing harder than a pillow the first 24 hours.  Do not put weight on it.  Do not get it wet. You may take it off to take a shower or bath.  You may have been given an elastic bandage to use with or without the plaster splint. The splint is too tight if you have numbness or tingling, or if the skin around the bandage becomes cold and blue. Adjust the bandage to make it comfortable.  If an air splint was applied:  You may alter the amount of air in the splint as needed for comfort.  You may take it off at night and to take a shower or bath.  If the injury was in either leg, wiggle your toes in the splint several times per day if you are able.  Only take over-the-counter or prescription medicines for pain, discomfort, or fever as directed by your health care provider.  Keep all follow-up visits with your health care provider. This includes   any orthopedic referrals, physical therapy, and rehabilitation. Any delay in getting necessary care could result in a delay or failure of the bones to heal. SEEK MEDICAL CARE IF:   You have an increase in bruising, swelling, tenderness, heat, or pain over your injury.  You notice coldness of your toes that does not improve after removing a splint or bandage.  Your pain is not lessened after you take medicine.  You have increased difficulty bearing weight on the injured leg, if the injury is in either leg. SEEK IMMEDIATE MEDICAL CARE IF:   You have severe pain near the injured area or severe pain with stretching.  You have increased  swelling that resulted in a tense, hard area or a loss of sensation in the area of the injury.  You have pale, cool skin below the area of the injury (in an extremity) that does not go away after removing a splint or bandage. MAKE SURE YOU:   Understand these instructions.  Will watch your condition.  Will get help right away if you are not doing well or get worse.   This information is not intended to replace advice given to you by your health care provider. Make sure you discuss any questions you have with your health care provider.   Document Released: 11/02/2004 Document Revised: 07/16/2013 Document Reviewed: 03/14/2013 Elsevier Interactive Patient Education 2016 [employer]  

## 2015-12-09 NOTE — ED Notes (Signed)
Pt was leaving [place], states she stepped on uneven ground, and fell, landed on right shoulder and right knee. Pt tearful and in pain in triage.

## 2015-12-09 NOTE — ED Provider Notes (Signed)
[facility] Emergency Department Provider Note  ____________________________________________  Time seen: Approximately 4:04 PM  I have reviewed the triage vital signs and the nursing notes.   HISTORY  Chief Complaint Fall    HPI Jessica Armstrong is a 70 y.o. female who presents emergency department status post mechanical fall. Patient states that she was leaving her doctor's office when she missed the step on a curve falling and landing on her right shoulder and right knee. Patient states the pain is severe, constant, worse with movement. Patient denies hitting her head or losing consciousness at any time. Patient states that the pain is making her nauseated but she has not had any episodes of emesis. Patient is on no blood thinners. Patient states that the pain is in the lateral aspect of her right shoulder and anterior right knee. No other injuries or complaints at this time.   Past Medical History  Diagnosis Date  . Hypercholesterolemia   . Post-menopausal   . Hypertension   . Claudication ([place])   . Hyperglycemia   . Cystitis   . Palpitations   . Neuropathy ([place])   . Colon polyp   . History of measles   . History of mumps   . Pulmonary valve dysplasia   . PONV (postoperative nausea and vomiting)     severe n/v after anesthesia  . Seasonal allergies   . H/O vertigo   . GERD (gastroesophageal reflux disease)   . Depression     situational  . Anemia     mild; no medical treatment  . Arthritis   . Need for SBE (subacute bacterial endocarditis) prophylaxis   . Tricuspid regurgitation     a. echo 04/2013: EF 55-60%, nl wall motion, mild to mod TR, PASP 35 mm Hg  . Cervical spondyloarthritis   . Carotid artery stenosis     a. doppler [id number]: RICA 123456, LICA 123456 f/u 1 year  . Chronic diastolic CHF (congestive heart failure) ([place])     a. echo 04/2015: EF 55-60%, no RWMA, mitral valve w/ systolic bowing w/o prolapse, PASP 53 mmHg    Patient Active  Problem List   Diagnosis Date Noted  . Carotid artery stenosis   . Chronic diastolic CHF (congestive heart failure) ([place])   . Fatigue 04/19/2015  . Anxiety 04/19/2015  . Allergic rhinitis 04/19/2015  . Arthritis 04/19/2015  . 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 Diverticulitis 04/19/2015  . Acid reflux 04/19/2015  . Hormone replacement therapy (postmenopausal) 04/19/2015  . Hypercholesteremia 04/19/2015  . Blood glucose elevated 04/19/2015  . Neuropathy ([place]) 04/19/2015  . Pericarditis 04/19/2015  . Common wart 04/19/2015  . Hot flashes 06/10/2013  . Hemoptysis 04/21/2013  . Chest pain 04/08/2013  . SOB (shortness of breath) 04/08/2013  . Cough 04/08/2013  . Murmur 04/08/2013  . Essential hypertension 04/08/2013    Past Surgical History  Procedure Laterality Date  . Cholecystectomy    . Abdominal hysterectomy    . Anterior cervical decomp/discectomy fusion N/A 08/26/2013    Procedure: CERVICAL SIX TO SEVEN ANTERIOR CERVICAL DECOMPRESSION/DISCECTOMY FUSION 1 LEVEL;  Surgeon: Erline Levine, MD;  Location: [place] NEURO ORS;  Service: Neurosurgery;  Laterality: N/A;  C6-7 Anterior cervical decompression/diskectomy/fusion  . Eye surgery      benign tumor of eye    Current Outpatient Rx  Name  Route  Sig  Dispense  Refill  . albuterol (PROAIR HFA) 108 (90 Base) MCG/ACT inhaler   Inhalation   Inhale  1 puff into the lungs every 4 (four) hours as needed for wheezing or shortness of breath.   3 Inhaler   3     HOLD DO NOT FILL UNTIL PATIENT NEEDS IT. PER PATIE .Marland Kitchen.   . Cinnamon 500 MG capsule   Oral   Take 500 mg by mouth daily.         . diazepam (VALIUM) 2 MG tablet   Oral   Take 1 tablet (2 mg total) by mouth 3 (three) times daily as needed for anxiety.   270 tablet   0     HOLD DO NOT FILL UNTIL PATIENT NEEDS IT. PER PATIE .Marland Kitchen.   . Docusate Calcium (STOOL SOFTENER PO)   Oral   Take by mouth.         . estradiol (ESTRACE) 0.5 MG  tablet   Oral   Take 1 tablet (0.5 mg total) by mouth daily.   90 tablet   0     HOLD DO NOT FILL UNTIL PATIENT NEEDS IT. PER PATIE .Marland Kitchen.   . fluconazole (DIFLUCAN) 150 MG tablet   Oral   Take by mouth.         . hydrALAZINE (APRESOLINE) 50 MG tablet   Oral   Take 1 tablet (50 mg total) by mouth 3 (three) times daily.   270 tablet   3     HOLD DO NOT FILL UNTIL PATIENT NEEDS IT. PER PATIE .Marland Kitchen.   . loratadine (CLARITIN) 10 MG tablet   Oral   Take 1 tablet (10 mg total) by mouth daily.   90 tablet   3     HOLD DO NOT FILL UNTIL PATIENT NEEDS IT. PER PATIE .Marland Kitchen.   . meclizine (ANTIVERT) 25 MG tablet   Oral   Take 1 tablet (25 mg total) by mouth daily as needed for dizziness.   90 tablet   3     HOLD DO NOT FILL UNTIL PATIENT NEEDS IT. PER PATIE .Marland Kitchen.   . metoprolol succinate (TOPROL-XL) 25 MG 24 hr tablet   Oral   Take 1 tablet (25 mg total) by mouth daily.   90 tablet   3     HOLD DO NOT FILL UNTIL PATIENT NEEDS IT. PER PATIE .Marland Kitchen.   . Multiple Vitamin (MULTIVITAMIN) tablet   Oral   Take 1 tablet by mouth daily.         . ondansetron (ZOFRAN-ODT) 4 MG disintegrating tablet   Oral   Take 1 tablet (4 mg total) by mouth every 8 (eight) hours as needed for nausea or vomiting.   20 tablet   0   . OVER THE COUNTER MEDICATION      Clear lung         . oxyCODONE-acetaminophen (ROXICET) 5-325 MG tablet   Oral   Take 1 tablet by mouth every 6 (six) hours as needed for severe pain.   20 tablet   0   . pantoprazole (PROTONIX) 40 MG tablet   Oral   Take 1 tablet (40 mg total) by mouth 2 (two) times daily.   180 tablet   3     Please fill 90 day supply instead of 30 days   . PARoxetine (PAXIL) 20 MG tablet   Oral   Take 1 tablet (20 mg total) by mouth daily.   90 tablet   3     HOLD DO NOT FILL UNTIL PATIENT NEEDS IT. PER PATIE .Marland Kitchen.   . Red Yeast  Rice Extract (RED YEAST RICE PO)   Oral   Take by mouth.         . triamterene-hydrochlorothiazide  (DYAZIDE) 37.5-25 MG capsule   Oral   Take 1 each (1 capsule total) by mouth daily.   90 capsule   3     HOLD DO NOT FILL UNTIL PATIENT NEEDS IT. PER PATIE .Marland Kitchen.   . verapamil (CALAN-SR) 180 MG CR tablet   Oral   Take 1 tablet (180 mg total) by mouth 2 (two) times daily.   180 tablet   3     HOLD DO NOT FILL UNTIL PATIENT NEEDS IT. PER PATIE .Marland KitchenMarland Kitchen     Allergies Ciprofloxacin; Codeine sulfate; Keflex; Macrobid; and Sulfa antibiotics  Family History  Problem Relation Age of Onset  . Heart disease Mother   . Hypertension Mother   . COPD Mother     was a smoker  . Hypertension Brother   . Colon cancer Father   . Asthma Maternal Grandfather     Social History Social History  Substance Use Topics  . Smoking status: Never Smoker   . 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 Alcohol Use: Yes     Comment: rare use 1-2 drinks per year.      Review of Systems  Constitutional: No fever/chills Eyes: No visual changes.  Cardiovascular: no chest pain. Respiratory: no cough. No SOB. Gastrointestinal: Positive for nausea but denies vomiting.  Musculoskeletal: Negative for back pain. Positive for right shoulder and right knee pain. Skin: Negative for rash. Neurological: Negative for headaches, focal weakness or numbness. 10-point ROS otherwise negative.  ____________________________________________   PHYSICAL EXAM:  VITAL SIGNS: ED Triage Vitals  Enc Vitals Group     BP 12/09/15 1529 145/55 mmHg     Pulse Rate 12/09/15 1529 69     Resp 12/09/15 1529 18     Temp 12/09/15 1529 97.7 F (36.5 C)     Temp Source 12/09/15 1529 Oral     SpO2 12/09/15 1529 97 %     Weight 12/09/15 1524 170 lb (77.111 kg)     Height 12/09/15 1524 5\' 7"  (1.702 m)     Head Cir --      Peak Flow --      Pain Score 12/09/15 1524 10     Pain Loc --      Pain Edu? --      Excl. in [place]? --      Constitutional: Alert and oriented. Well  appearing and in no acute distress. Eyes: Conjunctivae are normal. PERRL. EOMI. Head: Atraumatic. Neck: No stridor.  No cervical spine tenderness to palpation. Hematological/Lymphatic/Immunilogical: No cervical lymphadenopathy. Cardiovascular: Normal rate, regular rhythm. Normal S1 and S2.  Good peripheral circulation. Respiratory: Normal respiratory effort without tachypnea or retractions. Lungs CTAB. Musculoskeletal: No visible deformity to right shoulder when compared to left. Patient has some mild ecchymosis to the lateral aspect of right shoulder. Patient is very tender to palpation on the lateral aspect. No palpable abnormality. Limited range of motion due to pain. Radial pulse and sensation intact distally. Edema noted to the anterior aspect of the right knee but no deformity noted. Limited range of motion due to pain. Patient is diffusely tender to palpation over the anterior aspect of the right knee. No palpable abnormality. No ballottement. Varus, valgus, Lachman's are negative. Dorsalis pedis and sensation intact distally. Neurologic:  Normal speech and  language. No gross focal neurologic deficits are appreciated. Cranial nerves II through XII are grossly intact. Skin:  Skin is warm, dry and intact. No rash noted. Psychiatric: Mood and affect are normal. Speech and behavior are normal. Patient exhibits appropriate insight and judgement.   ____________________________________________   LABS (all labs ordered are listed, but only abnormal results are displayed)  Labs Reviewed - No data to display ____________________________________________  EKG   ____________________________________________  RADIOLOGY Diamantina Providence Shaquill Iseman, personally viewed and evaluated these images (plain radiographs) as part of my medical decision making, as well as reviewing the written report by the radiologist.  Dg Shoulder Right  12/09/2015  CLINICAL DATA:  [address] in [facility] parking lot onto  RIGHT side, RIGHT shoulder pain, unable to move arm EXAM: RIGHT SHOULDER - 2+ VIEW COMPARISON:  RIGHT clavicular radiographs 12/19/2010 FINDINGS: Osseous demineralization. Minimal degenerative changes RIGHT AC joint. Glenohumeral joint alignment normal. No acute fracture, dislocation or bone destruction. Visualized RIGHT ribs intact. IMPRESSION: No acute osseous abnormalities. Electronically Signed   By: Lavonia Dana M.D.   On: 12/09/2015 16:05   Dg Knee Complete 4 Views Right  12/09/2015  CLINICAL DATA:  Fall in parking lot, right knee pain. EXAM: RIGHT KNEE - COMPLETE 4+ VIEW COMPARISON:  None. FINDINGS: There is no evidence of fracture, dislocation, or joint effusion. There is no evidence of arthropathy or other focal bone abnormality. Soft tissues are unremarkable. Incidental note made of a small enthesophyte along the upper margin of the patella. IMPRESSION: Negative. Electronically Signed   By: Franki Cabot M.D.   On: 12/09/2015 16:06    ____________________________________________    PROCEDURES  Procedure(s) performed:       Medications  oxyCODONE-acetaminophen (PERCOCET/ROXICET) 5-325 MG per tablet 1 tablet (1 tablet Oral Given 12/09/15 1635)  ondansetron (ZOFRAN-ODT) disintegrating tablet 8 mg (8 mg Oral Given 12/09/15 1635)     ____________________________________________   INITIAL IMPRESSION / ASSESSMENT AND PLAN / ED COURSE  Pertinent labs & imaging results that were available during my care of the patient were reviewed by me and considered in my medical decision making (see chart for details).  Patient's diagnosis is consistent with right shoulder and right knee contusions. X-rays are ordered and no acute osseous abnormality is identified. Patient's exam is reassuring and no further imaging is undertaken at this time.. Patient will be discharged home with prescriptions for pain medication and antinausea medication. Patient is to follow up with primary care provider if symptoms  persist past this treatment course. Patient is given ED precautions to return to the ED for any worsening or new symptoms.     ____________________________________________  FINAL CLINICAL IMPRESSION(S) / ED DIAGNOSES  Final diagnoses:  Shoulder contusion, right, initial encounter  Knee contusion, right, initial encounter      NEW MEDICATIONS STARTED DURING THIS VISIT:  New Prescriptions   ONDANSETRON (ZOFRAN-ODT) 4 MG DISINTEGRATING TABLET    Take 1 tablet (4 mg total) by mouth every 8 (eight) hours as needed for nausea or vomiting.   OXYCODONE-ACETAMINOPHEN (ROXICET) 5-325 MG TABLET    Take 1 tablet by mouth every 6 (six) hours as needed for severe pain.        Charline Bills Disha Cottam, PA-C 12/09/15 1640  Hinda Kehr, MD 12/10/15 [phone]

## 2015-12-09 NOTE — ED Notes (Signed)
Does not think she hit her head, no head pain, however, she is nauseated due to the pain she states, pt is not on blood thinners.

## 2015-12-10 DIAGNOSIS — R109 Unspecified abdominal pain: Secondary | ICD-10-CM | POA: Diagnosis not present

## 2015-12-10 DIAGNOSIS — G8929 Other chronic pain: Secondary | ICD-10-CM | POA: Diagnosis not present

## 2015-12-16 ENCOUNTER — Ambulatory Visit
Admission: RE | Admit: 2015-12-16 | Discharge: 2015-12-16 | Disposition: A | Discharge status: Home / Self Care | Payer: PPO | Source: Ambulatory Visit | Attending: Nurse Practitioner | Admitting: Nurse Practitioner

## 2015-12-16 DIAGNOSIS — I700 Atherosclerosis of aorta: Secondary | ICD-10-CM | POA: Insufficient documentation

## 2015-12-16 DIAGNOSIS — Z90710 Acquired absence of both cervix and uterus: Secondary | ICD-10-CM | POA: Insufficient documentation

## 2015-12-16 DIAGNOSIS — G8929 Other chronic pain: Secondary | ICD-10-CM

## 2015-12-16 DIAGNOSIS — Z9049 Acquired absence of other specified parts of digestive tract: Secondary | ICD-10-CM | POA: Diagnosis not present

## 2015-12-16 DIAGNOSIS — R109 Unspecified abdominal pain: Secondary | ICD-10-CM | POA: Insufficient documentation

## 2015-12-16 DIAGNOSIS — N281 Cyst of kidney, acquired: Secondary | ICD-10-CM | POA: Insufficient documentation

## 2015-12-16 DIAGNOSIS — K769 Liver disease, unspecified: Secondary | ICD-10-CM | POA: Diagnosis not present

## 2015-12-16 DIAGNOSIS — R1084 Generalized abdominal pain: Secondary | ICD-10-CM

## 2015-12-16 MED ORDER — IOHEXOL 300 MG/ML  SOLN
100.0000 mL | Freq: Once | INTRAMUSCULAR | Status: AC | PRN
  Administered 2015-12-16: 100 mL via INTRAVENOUS

## 2015-12-23 DIAGNOSIS — R1084 Generalized abdominal pain: Secondary | ICD-10-CM | POA: Diagnosis not present

## 2015-12-23 DIAGNOSIS — R1310 Dysphagia, unspecified: Secondary | ICD-10-CM | POA: Diagnosis not present

## 2015-12-23 DIAGNOSIS — R0609 Other forms of dyspnea: Secondary | ICD-10-CM | POA: Diagnosis not present

## 2015-12-23 DIAGNOSIS — K219 Gastro-esophageal reflux disease without esophagitis: Secondary | ICD-10-CM | POA: Diagnosis not present

## 2015-12-23 DIAGNOSIS — Z86010 Personal history of colonic polyps: Secondary | ICD-10-CM | POA: Diagnosis not present

## 2015-12-23 DIAGNOSIS — K589 Irritable bowel syndrome without diarrhea: Secondary | ICD-10-CM | POA: Diagnosis not present

## 2015-12-28 ENCOUNTER — Telehealth: Payer: Self-pay

## 2015-12-28 NOTE — Telephone Encounter (Signed)
Waiting to see if Dr. Rosanna Randy wants to proceed with PA for Estradiol for the patient due to high risk-aa

## 2015-12-29 NOTE — Telephone Encounter (Signed)
Per fax Estradiol approved from 12/29/15 until 10/08/16-aa

## 2015-12-29 NOTE — Telephone Encounter (Signed)
Faxed filled out PA form to the company, waiting on response-aa

## 2015-12-30 DIAGNOSIS — K589 Irritable bowel syndrome without diarrhea: Secondary | ICD-10-CM | POA: Diagnosis not present

## 2016-01-13 ENCOUNTER — Encounter: Payer: Self-pay | Admitting: *Deleted

## 2016-01-14 ENCOUNTER — Ambulatory Visit: Payer: PPO | Admitting: Anesthesiology

## 2016-01-14 ENCOUNTER — Ambulatory Visit
Admission: RE | Admit: 2016-01-14 | Discharge: 2016-01-14 | Disposition: A | Discharge status: Home / Self Care | Payer: PPO | Source: Ambulatory Visit | Attending: Unknown Physician Specialty | Admitting: Unknown Physician Specialty

## 2016-01-14 ENCOUNTER — Encounter
Admission: RE | Disposition: A | Discharge status: Home / Self Care | Payer: Self-pay | Source: Ambulatory Visit | Attending: Unknown Physician Specialty

## 2016-01-14 ENCOUNTER — Encounter: Payer: Self-pay | Admitting: *Deleted

## 2016-01-14 DIAGNOSIS — J309 Allergic rhinitis, unspecified: Secondary | ICD-10-CM | POA: Insufficient documentation

## 2016-01-14 DIAGNOSIS — R0602 Shortness of breath: Secondary | ICD-10-CM | POA: Diagnosis not present

## 2016-01-14 DIAGNOSIS — K5730 Diverticulosis of large intestine without perforation or abscess without bleeding: Secondary | ICD-10-CM | POA: Diagnosis not present

## 2016-01-14 DIAGNOSIS — D122 Benign neoplasm of ascending colon: Secondary | ICD-10-CM | POA: Insufficient documentation

## 2016-01-14 DIAGNOSIS — K2270 Barrett's esophagus without dysplasia: Secondary | ICD-10-CM | POA: Diagnosis not present

## 2016-01-14 DIAGNOSIS — K210 Gastro-esophageal reflux disease with esophagitis: Secondary | ICD-10-CM | POA: Diagnosis not present

## 2016-01-14 DIAGNOSIS — M1990 Unspecified osteoarthritis, unspecified site: Secondary | ICD-10-CM | POA: Diagnosis not present

## 2016-01-14 DIAGNOSIS — R1310 Dysphagia, unspecified: Secondary | ICD-10-CM | POA: Diagnosis not present

## 2016-01-14 DIAGNOSIS — D649 Anemia, unspecified: Secondary | ICD-10-CM | POA: Diagnosis not present

## 2016-01-14 DIAGNOSIS — Z8619 Personal history of other infectious and parasitic diseases: Secondary | ICD-10-CM | POA: Insufficient documentation

## 2016-01-14 DIAGNOSIS — F329 Major depressive disorder, single episode, unspecified: Secondary | ICD-10-CM | POA: Diagnosis not present

## 2016-01-14 DIAGNOSIS — I739 Peripheral vascular disease, unspecified: Secondary | ICD-10-CM | POA: Diagnosis not present

## 2016-01-14 DIAGNOSIS — K589 Irritable bowel syndrome without diarrhea: Secondary | ICD-10-CM | POA: Insufficient documentation

## 2016-01-14 DIAGNOSIS — Z885 Allergy status to narcotic agent status: Secondary | ICD-10-CM | POA: Diagnosis not present

## 2016-01-14 DIAGNOSIS — Z9049 Acquired absence of other specified parts of digestive tract: Secondary | ICD-10-CM | POA: Diagnosis not present

## 2016-01-14 DIAGNOSIS — Z79899 Other long term (current) drug therapy: Secondary | ICD-10-CM | POA: Diagnosis not present

## 2016-01-14 DIAGNOSIS — R1084 Generalized abdominal pain: Secondary | ICD-10-CM | POA: Insufficient documentation

## 2016-01-14 DIAGNOSIS — Z1211 Encounter for screening for malignant neoplasm of colon: Secondary | ICD-10-CM | POA: Diagnosis not present

## 2016-01-14 DIAGNOSIS — K317 Polyp of stomach and duodenum: Secondary | ICD-10-CM | POA: Diagnosis not present

## 2016-01-14 DIAGNOSIS — Z882 Allergy status to sulfonamides status: Secondary | ICD-10-CM | POA: Diagnosis not present

## 2016-01-14 DIAGNOSIS — I6529 Occlusion and stenosis of unspecified carotid artery: Secondary | ICD-10-CM | POA: Insufficient documentation

## 2016-01-14 DIAGNOSIS — K640 First degree hemorrhoids: Secondary | ICD-10-CM | POA: Diagnosis not present

## 2016-01-14 DIAGNOSIS — K3189 Other diseases of stomach and duodenum: Secondary | ICD-10-CM | POA: Diagnosis not present

## 2016-01-14 DIAGNOSIS — K635 Polyp of colon: Secondary | ICD-10-CM | POA: Diagnosis not present

## 2016-01-14 DIAGNOSIS — D123 Benign neoplasm of transverse colon: Secondary | ICD-10-CM | POA: Diagnosis not present

## 2016-01-14 DIAGNOSIS — I110 Hypertensive heart disease with heart failure: Secondary | ICD-10-CM | POA: Insufficient documentation

## 2016-01-14 DIAGNOSIS — G629 Polyneuropathy, unspecified: Secondary | ICD-10-CM | POA: Diagnosis not present

## 2016-01-14 DIAGNOSIS — Z90710 Acquired absence of both cervix and uterus: Secondary | ICD-10-CM | POA: Insufficient documentation

## 2016-01-14 DIAGNOSIS — R011 Cardiac murmur, unspecified: Secondary | ICD-10-CM | POA: Insufficient documentation

## 2016-01-14 DIAGNOSIS — Z86010 Personal history of colonic polyps: Secondary | ICD-10-CM | POA: Insufficient documentation

## 2016-01-14 DIAGNOSIS — E7800 Pure hypercholesterolemia, unspecified: Secondary | ICD-10-CM | POA: Insufficient documentation

## 2016-01-14 DIAGNOSIS — K649 Unspecified hemorrhoids: Secondary | ICD-10-CM | POA: Diagnosis not present

## 2016-01-14 DIAGNOSIS — I5032 Chronic diastolic (congestive) heart failure: Secondary | ICD-10-CM | POA: Diagnosis not present

## 2016-01-14 HISTORY — PX: COLONOSCOPY WITH PROPOFOL: SHX5780

## 2016-01-14 HISTORY — PX: ESOPHAGOGASTRODUODENOSCOPY (EGD) WITH PROPOFOL: SHX5813

## 2016-01-14 LAB — SURGICAL PATHOLOGY

## 2016-01-14 SURGERY — ESOPHAGOGASTRODUODENOSCOPY (EGD) WITH PROPOFOL
Anesthesia: General

## 2016-01-14 MED ORDER — SODIUM CHLORIDE 0.9 % IV SOLN: INTRAVENOUS | Status: DC

## 2016-01-14 MED ORDER — EPHEDRINE SULFATE 50 MG/ML IJ SOLN
INTRAMUSCULAR | Status: DC | PRN
  Administered 2016-01-14: 5 mg via INTRAVENOUS

## 2016-01-14 MED ORDER — PHENYLEPHRINE HCL 10 MG/ML IJ SOLN
INTRAMUSCULAR | Status: DC | PRN
  Administered 2016-01-14 (×2): 100 ug via INTRAVENOUS

## 2016-01-14 MED ORDER — PROPOFOL 10 MG/ML IV BOLUS
INTRAVENOUS | Status: DC | PRN
  Administered 2016-01-14: 30 mg via INTRAVENOUS
  Administered 2016-01-14 (×2): 50 mg via INTRAVENOUS

## 2016-01-14 MED ORDER — FENTANYL CITRATE (PF) 100 MCG/2ML IJ SOLN
INTRAMUSCULAR | Status: DC | PRN
  Administered 2016-01-14: 50 ug via INTRAVENOUS

## 2016-01-14 MED ORDER — PROPOFOL 500 MG/50ML IV EMUL
INTRAVENOUS | Status: DC | PRN
  Administered 2016-01-14: 125 ug/kg/min via INTRAVENOUS

## 2016-01-14 MED ORDER — SODIUM CHLORIDE 0.9 % IV SOLN
INTRAVENOUS | Status: DC
  Administered 2016-01-14: 11:00:00 via INTRAVENOUS

## 2016-01-14 NOTE — Transfer of Care (Signed)
Immediate Anesthesia Transfer of Care Note  [name]  Procedure(s) Performed: Procedure(s): ESOPHAGOGASTRODUODENOSCOPY (EGD) WITH PROPOFOL (N/A) COLONOSCOPY WITH PROPOFOL (N/A)  Patient Location: PACU and Endoscopy Unit  Anesthesia Type:General  Level of Consciousness: awake  Airway & Oxygen Therapy: Patient Spontanous Breathing  Post-op Assessment: Report given to RN  Post vital signs: stable  Last Vitals:  Filed Vitals:   01/14/16 1046  BP: 144/62  Pulse: 82  Temp: 35.9 C  Resp: 18    Complications: No apparent anesthesia complications  Past Medical History  Diagnosis Date  . Hypercholesterolemia   . Post-menopausal   . Hypertension   . Claudication ([place])   . Hyperglycemia   . Cystitis   . Palpitations   . Neuropathy ([place])   . Colon polyp   . History of measles   . History of mumps   . Pulmonary valve dysplasia   . PONV (postoperative nausea and vomiting)     severe n/v after anesthesia  . Seasonal allergies   . H/O vertigo   . GERD (gastroesophageal reflux disease)   . Depression     situational  . Anemia     mild; no medical treatment  . Arthritis   . Need for SBE (subacute bacterial endocarditis) prophylaxis   . Tricuspid regurgitation     a. echo 04/2013: EF 55-60%, nl wall motion, mild to mod TR, PASP 35 mm Hg  . Cervical spondyloarthritis   . Carotid artery stenosis     a. doppler XX123456: RICA 123456, LICA 123456 f/u 1 year  . Chronic diastolic CHF (congestive heart failure) ([place])     a. echo 04/2015: EF 55-60%, no RWMA, mitral valve w/ systolic bowing w/o prolapse, PASP 53 mmHg   Past Surgical History  Procedure Laterality Date  . Cholecystectomy    . Abdominal hysterectomy    . Anterior cervical decomp/discectomy fusion N/A 08/26/2013    Procedure: CERVICAL SIX TO SEVEN ANTERIOR CERVICAL DECOMPRESSION/DISCECTOMY FUSION 1 LEVEL;  Surgeon: Erline Levine, MD;  Location: [place] NEURO ORS;  Service: Neurosurgery;  Laterality: N/A;  C6-7  Anterior cervical decompression/diskectomy/fusion  . Eye surgery      benign tumor of eye   Scheduled Meds:  Continuous Infusions: . sodium chloride 20 mL/hr at 01/14/16 1104  . sodium chloride     PRN Meds:.

## 2016-01-14 NOTE — H&P (Signed)
Primary Care Physician:  Wilhemena Durie, MD Primary Gastroenterologist:  Dr. Vira Agar  Pre-Procedure History & Physical: HPI:  Jessica Armstrong is a 70 y.o. female is here for an endoscopy and colonoscopy.   Past Medical History  Diagnosis Date  . Hypercholesterolemia   . Post-menopausal   . Hypertension   . Claudication ([place])   . Hyperglycemia   . Cystitis   . Palpitations   . Neuropathy ([place])   . Colon polyp   . History of measles   . History of mumps   . Pulmonary valve dysplasia   . PONV (postoperative nausea and vomiting)     severe n/v after anesthesia  . Seasonal allergies   . H/O vertigo   . GERD (gastroesophageal reflux disease)   . Depression     situational  . Anemia     mild; no medical treatment  . Arthritis   . Need for SBE (subacute bacterial endocarditis) prophylaxis   . Tricuspid regurgitation     a. echo 04/2013: EF 55-60%, nl wall motion, mild to mod TR, PASP 35 mm Hg  . Cervical spondyloarthritis   . Carotid artery stenosis     a. doppler [id number]: RICA 123456, LICA 123456 f/u 1 year  . Chronic diastolic CHF (congestive heart failure) ([place])     a. echo 04/2015: EF 55-60%, no RWMA, mitral valve w/ systolic bowing w/o prolapse, PASP 53 mmHg    Past Surgical History  Procedure Laterality Date  . Cholecystectomy    . Abdominal hysterectomy    . Anterior cervical decomp/discectomy fusion N/A 08/26/2013    Procedure: CERVICAL SIX TO SEVEN ANTERIOR CERVICAL DECOMPRESSION/DISCECTOMY FUSION 1 LEVEL;  Surgeon: Erline Levine, MD;  Location: [place] [facility] ORS;  Service: Neurosurgery;  Laterality: N/A;  C6-7 Anterior cervical decompression/diskectomy/fusion  . Eye surgery      benign tumor of eye    Prior to Admission medications   Medication Sig Start Date End Date Taking? Authorizing Provider  albuterol (PROAIR HFA) 108 (90 Base) MCG/ACT inhaler Inhale 1 puff into the lungs every 4 (four) hours as needed for wheezing or shortness of breath. 12/07/15   Richard Maceo Pro., MD  Cinnamon 500 MG capsule Take 500 mg by mouth daily.    Historical Provider, MD  diazepam (VALIUM) 2 MG tablet Take 1 tablet (2 mg total) by mouth 3 (three) times daily as needed for anxiety. 12/07/15   Richard Maceo Pro., MD  Docusate Calcium (STOOL SOFTENER PO) Take by mouth.    Historical Provider, MD  estradiol (ESTRACE) 0.5 MG tablet Take 1 tablet (0.5 mg total) by mouth daily. 12/07/15   Jerrol Banana., MD  fluconazole (DIFLUCAN) 150 MG tablet Take by mouth. 02/11/15   Historical Provider, MD  hydrALAZINE (APRESOLINE) 50 MG tablet Take 1 tablet (50 mg total) by mouth 3 (three) times daily. 12/07/15   Richard Maceo Pro., MD  loratadine (CLARITIN) 10 MG tablet Take 1 tablet (10 mg total) by mouth daily. 12/07/15   Richard Maceo Pro., MD  meclizine (ANTIVERT) 25 MG tablet Take 1 tablet (25 mg total) by mouth daily as needed for dizziness. 12/07/15   Richard Maceo Pro., MD  meloxicam (MOBIC) 15 MG tablet Take 1 tablet (15 mg total) by mouth daily. 12/09/15   Charline Bills Cuthriell, PA-C  metoprolol succinate (TOPROL-XL) 25 MG 24 hr tablet Take 1 tablet (25 mg total) by mouth daily. 12/07/15 12/06/16  Jerrol Banana., MD  Multiple Vitamin (MULTIVITAMIN) tablet Take 1 tablet by mouth daily.    Historical Provider, MD  ondansetron (ZOFRAN-ODT) 4 MG disintegrating tablet Take 1 tablet (4 mg total) by mouth every 8 (eight) hours as needed for nausea or vomiting. 12/09/15   Charline Bills Cuthriell, PA-C  OVER THE COUNTER MEDICATION Clear lung    Historical Provider, MD  pantoprazole (PROTONIX) 40 MG tablet Take 1 tablet (40 mg total) by mouth 2 (two) times daily. 11/24/15   Richard Maceo Pro., MD  PARoxetine (PAXIL) 20 MG tablet Take 1 tablet (20 mg total) by mouth daily. 12/07/15   Jerrol Banana., MD  Red Yeast Rice Extract (RED YEAST RICE PO) Take by mouth.    Historical Provider, MD  traMADol (ULTRAM) 50 MG tablet Take 1 tablet (50 mg total) by mouth every 6 (six) hours  as needed. 12/09/15   Charline Bills Cuthriell, PA-C  triamterene-hydrochlorothiazide (DYAZIDE) 37.5-25 MG capsule Take 1 each (1 capsule total) by mouth daily. 12/07/15 12/06/16  Jerrol Banana., MD  verapamil (CALAN-SR) 180 MG CR tablet Take 1 tablet (180 mg total) by mouth 2 (two) times daily. 12/07/15   Richard Maceo Pro., MD    Allergies as of 01/06/2016 - Review Complete 12/09/2015  Allergen Reaction Noted  . Ciprofloxacin  06/15/2015  . Codeine sulfate Nausea And Vomiting 04/07/2013  . Keflex [cephalexin] Nausea And Vomiting 04/07/2013  . Macrobid [nitrofurantoin macrocrystal] Nausea And Vomiting 04/07/2013  . Sulfa antibiotics Nausea And Vomiting 04/07/2013    Family History  Problem Relation Age of Onset  . Heart disease Mother   . Hypertension Mother   . COPD Mother     was a smoker  . Hypertension Brother   . Colon cancer Father   . Asthma Maternal Grandfather     Social History   Social History  . Marital Status: Married    Spouse Name: N/A  . Number of Children: N/A  . Years of Education: N/A   Occupational History  . Not on file.   Social History Main Topics  . Smoking status: Never Smoker   . 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 Alcohol Use: No     Comment: rare use 1-2 drinks per year.   . Drug Use: No  . Sexual Activity: Not on file   Other Topics Concern  . Not on file   Social History Narrative    Review of Systems: See HPI, otherwise negative ROS  Physical Exam: BP 144/62 mmHg  Pulse 82  Temp(Src) 96.6 F (35.9 C) (Tympanic)  Resp 18  Ht 5\' 7"  (1.702 m)  Wt 77.111 kg (170 lb)  BMI 26.62 kg/m2  SpO2 98% General:   Alert,  pleasant and cooperative in NAD Head:  Normocephalic and atraumatic. Neck:  Supple; no masses or thyromegaly. Lungs:  Clear throughout to auscultation.    Heart:  Regular rate and rhythm. Abdomen:  Soft, nontender and nondistended. Normal bowel sounds,  without guarding, and without rebound.   Neurologic:  Alert and  oriented x4;  grossly normal neurologically.  Impression/Plan: Jessica Armstrong is here for an endoscopy and colonoscopy to be performed for dysphagia, generalized abdominal pain, GERD  Risks, benefits, limitations, and alternatives regarding  endoscopy and colonoscopy have been reviewed with the patient.  Questions have been answered.  All parties agreeable.   Gaylyn Cheers, MD  01/14/2016, 11:09 AM

## 2016-01-14 NOTE — Anesthesia Preprocedure Evaluation (Signed)
Anesthesia Evaluation  Patient identified by MRN, date of birth, ID band Patient awake    Reviewed: Allergy & Precautions, NPO status , Patient's Chart, lab work & pertinent test results, reviewed documented beta blocker date and time   History of Anesthesia Complications (+) PONV and history of anesthetic complications  Airway Mallampati: II  TM Distance: >3 FB     Dental  (+) Chipped, Partial Lower   Pulmonary shortness of breath,           Cardiovascular hypertension, Pt. on medications + Peripheral Vascular Disease and +CHF  + Valvular Problems/Murmurs      Neuro/Psych PSYCHIATRIC DISORDERS Anxiety Depression    GI/Hepatic   Endo/Other    Renal/GU      Musculoskeletal  (+) Arthritis ,   Abdominal   Peds  Hematology  (+) anemia ,   Anesthesia Other Findings On previous endoscopy she felt the scope. I reviewed with CRNA.  Reproductive/Obstetrics                             Anesthesia Physical Anesthesia Plan  ASA: III  Anesthesia Plan: General   Post-op Pain Management:    Induction: Intravenous  Airway Management Planned: Nasal Cannula  Additional Equipment:   Intra-op Plan:   Post-operative Plan:   Informed Consent: I have reviewed the patients History and Physical, chart, labs and discussed the procedure including the risks, benefits and alternatives for the proposed anesthesia with the patient or authorized representative who has indicated his/her understanding and acceptance.     Plan Discussed with: CRNA  Anesthesia Plan Comments:         Anesthesia Quick Evaluation

## 2016-01-14 NOTE — Op Note (Signed)
[facility] Gastroenterology Patient Name: Jessica Armstrong Procedure Date: 01/14/2016 11:11 AM MRN: [id number] Account #: [id number] Date of Birth: [birth year] Admit Type: Outpatient Age: 70 Room: [facility] ENDO ROOM 1 Gender: Female Note Status: Finalized Procedure:            Upper GI endoscopy Indications:          Dysphagia, Heartburn Providers:            Manya Silvas, MD Referring MD:         Janine Ores. Rosanna Randy, MD (Referring MD) Medicines:            Propofol per Anesthesia Complications:        No immediate complications. Procedure:            Pre-Anesthesia Assessment:                       - After reviewing the risks and benefits, the patient                        was deemed in satisfactory condition to undergo the                        procedure.                       After obtaining informed consent, the endoscope was                        passed under direct vision. Throughout the procedure,                        the patient's blood pressure, pulse, and oxygen                        saturations were monitored continuously. The Endoscope                        was introduced through the mouth, and advanced to the                        second part of duodenum. The upper GI endoscopy was                        accomplished without difficulty. The patient tolerated                        the procedure well. Findings:      LA Grade A (one or more mucosal breaks less than 5 mm, not extending       between tops of 2 mucosal folds) esophagitis with no bleeding was found.       Biopsies were taken with a cold forceps for histology.      A single 16 mm pedunculated polyp with no bleeding and no stigmata of       recent bleeding was found in the gastric antrum. The polyp was removed       with a hot snare. Resection and retrieval were complete. 2 clips placed       at the site.      A single 4 mm sessile polyp with no bleeding and no stigmata of recent   bleeding was found  in the gastric antrum. Biopsies were taken with a       cold forceps for histology.      The examined duodenum was normal.      At the end of the procedure a guide wire was inserted and a 11F Savary       dilator was passed and was snug so no further dilators used at this time. Impression:           - LA Grade A reflux esophagitis. Rule out Barrett's                        esophagus. Biopsied.                       - A single gastric polyp. Resected and retrieved.                       - A single gastric polyp. Biopsied.                       - Normal examined duodenum. Recommendation:       - Await pathology results. Manya Silvas, MD 01/14/2016 11:37:17 AM This report has been signed electronically. Number of Addenda: 0 Note Initiated On: 01/14/2016 11:11 AM      [facility]

## 2016-01-14 NOTE — Op Note (Signed)
[facility] Gastroenterology Patient Name: Samiksha Vario Procedure Date: 01/14/2016 11:11 AM MRN: [id number] Account #: [id number] Date of Birth: Nov 30, 1945 Admit Type: Outpatient Age: 70 Room: [facility] ENDO ROOM 1 Gender: Female Note Status: Finalized Procedure:            Colonoscopy Indications:          High risk colon cancer surveillance: Personal history                        of colonic polyps Providers:            Manya Silvas, MD Referring MD:         Janine Ores. Rosanna Randy, MD (Referring MD) Medicines:            Propofol per Anesthesia Complications:        No immediate complications. Procedure:            Pre-Anesthesia Assessment:                       - After reviewing the risks and benefits, the patient                        was deemed in satisfactory condition to undergo the                        procedure.                       After obtaining informed consent, the colonoscope was                        passed under direct vision. Throughout the procedure,                        the patient's blood pressure, pulse, and oxygen                        saturations were monitored continuously. The                        Colonoscope was introduced through the anus and                        advanced to the the cecum, identified by appendiceal                        orifice and ileocecal valve. The colonoscopy was                        performed with difficulty due to restricted mobility of                        the colon and a tortuous colon. Successful completion                        of the procedure was aided by applying abdominal                        pressure. The patient tolerated the procedure well. The  quality of the bowel preparation was excellent. Findings:      A small polyp was found in the ascending colon. The polyp was sessile.       The polyp was removed with a hot snare. Resection and retrieval were   complete. 2 clips applied to the site.      A diminutive polyp was found in the transverse colon. The polyp was       sessile. The polyp was removed with a jumbo cold forceps. Resection and       retrieval were complete.      A few small-mouthed diverticula were found in the sigmoid colon.      Internal hemorrhoids were found during endoscopy. The hemorrhoids were       small and Grade I (internal hemorrhoids that do not prolapse).      The exam was otherwise without abnormality. Impression:           - One small polyp in the ascending colon, removed with                        a hot snare. Resected and retrieved.                       - One diminutive polyp in the transverse colon, removed                        with a jumbo cold forceps. Resected and retrieved.                       - Diverticulosis in the sigmoid colon.                       - Internal hemorrhoids.                       - The examination was otherwise normal. Recommendation:       - Await pathology results. Manya Silvas, MD 01/14/2016 12:19:20 PM This report has been signed electronically. Number of Addenda: 0 Note Initiated On: 01/14/2016 11:11 AM Scope Withdrawal Time: 0 hours 19 minutes 38 seconds  Total Procedure Duration: 0 hours 31 minutes 50 seconds       [facility]

## 2016-01-14 NOTE — Anesthesia Postprocedure Evaluation (Signed)
Anesthesia Post Note  [name] [facility]  Procedure(s) Performed: Procedure(s) (LRB): ESOPHAGOGASTRODUODENOSCOPY (EGD) WITH PROPOFOL (N/A) COLONOSCOPY WITH PROPOFOL (N/A)  Patient location during evaluation: Endoscopy Anesthesia Type: General Level of consciousness: awake and alert Pain management: pain level controlled Vital Signs Assessment: post-procedure vital signs reviewed and stable Respiratory status: spontaneous breathing, nonlabored ventilation, respiratory function stable and patient connected to nasal cannula oxygen Cardiovascular status: blood pressure returned to baseline and stable Postop Assessment: no signs of nausea or vomiting Anesthetic complications: no    Last Vitals:  Filed Vitals:   01/14/16 1235 01/14/16 1245  BP: 111/44 127/63  Pulse: 66 66  Temp:    Resp: 19 14    Last Pain: There were no vitals filed for this visit.               Lavida Patch S

## 2016-01-18 ENCOUNTER — Encounter: Payer: Self-pay | Admitting: Unknown Physician Specialty

## 2016-01-25 DIAGNOSIS — Z0000 Encounter for general adult medical examination without abnormal findings: Secondary | ICD-10-CM | POA: Diagnosis not present

## 2016-01-25 DIAGNOSIS — N281 Cyst of kidney, acquired: Secondary | ICD-10-CM | POA: Diagnosis not present

## 2016-01-31 ENCOUNTER — Other Ambulatory Visit: Payer: Self-pay | Admitting: Family Medicine

## 2016-01-31 ENCOUNTER — Ambulatory Visit
Admission: RE | Admit: 2016-01-31 | Discharge: 2016-01-31 | Disposition: A | Discharge status: Home / Self Care | Payer: PPO | Source: Ambulatory Visit | Attending: Family Medicine | Admitting: Family Medicine

## 2016-01-31 DIAGNOSIS — Z1231 Encounter for screening mammogram for malignant neoplasm of breast: Secondary | ICD-10-CM | POA: Diagnosis not present

## 2016-01-31 DIAGNOSIS — Z1239 Encounter for other screening for malignant neoplasm of breast: Secondary | ICD-10-CM

## 2016-02-28 ENCOUNTER — Encounter: Payer: Self-pay | Admitting: Family Medicine

## 2016-02-28 ENCOUNTER — Ambulatory Visit (INDEPENDENT_AMBULATORY_CARE_PROVIDER_SITE_OTHER): Payer: PPO | Admitting: Family Medicine

## 2016-02-28 VITALS — BP 124/52 | HR 68 | Temp 97.9°F | Resp 18 | Wt 167.0 lb

## 2016-02-28 DIAGNOSIS — J0100 Acute maxillary sinusitis, unspecified: Secondary | ICD-10-CM

## 2016-02-28 MED ORDER — AZITHROMYCIN 250 MG PO TABS: ORAL_TABLET | ORAL | Status: DC

## 2016-02-28 MED ORDER — ALBUTEROL SULFATE HFA 108 (90 BASE) MCG/ACT IN AERS: 1.0000 | INHALATION_SPRAY | RESPIRATORY_TRACT | Status: DC | PRN

## 2016-02-28 NOTE — Progress Notes (Signed)
Patient ID: Jessica Armstrong, female   DOB: 1945-11-12, 70 y.o.   MRN: [id number]       [name] Female    DOB: 1946-06-10   70 y.o.   MRN: [id number] Visit Date: 02/28/2016  Today's Provider: Vernie Murders, PA   Chief Complaint  Patient presents with  . Cough   Subjective:    HPI  Patient has felt bad for about 2 weeks now. Symptoms started with a severe sore throat at first and now has cough-productive with discolored phlegm, fatigue, decreased appetite, chest pain from coughing a lot, head congestion, hoarse voice, chills but no fever that she knows of, runny nose and no appetite. Some post nasal drip.   Past Medical History  Diagnosis Date  . Hypercholesterolemia   . Post-menopausal   . Hypertension   . Claudication ([place])   . Hyperglycemia   . Cystitis   . Palpitations   . Neuropathy ([place])   . Colon polyp   . History of measles   . History of mumps   . Pulmonary valve dysplasia   . PONV (postoperative nausea and vomiting)     severe n/v after anesthesia  . Seasonal allergies   . H/O vertigo   . GERD (gastroesophageal reflux disease)   . Depression     situational  . Anemia     mild; no medical treatment  . Arthritis   . Need for SBE (subacute bacterial endocarditis) prophylaxis   . Tricuspid regurgitation     a. echo 04/2013: EF 55-60%, nl wall motion, mild to mod TR, PASP 35 mm Hg  . Cervical spondyloarthritis   . Carotid artery stenosis     a. doppler [id number]: RICA 123456, LICA 123456 f/u 1 year  . Chronic diastolic CHF (congestive heart failure) ([place])     a. echo 04/2015: EF 55-60%, no RWMA, mitral valve w/ systolic bowing w/o prolapse, PASP 53 mmHg   Past Surgical History  Procedure Laterality Date  . Cholecystectomy    . Abdominal hysterectomy    . Anterior cervical decomp/discectomy fusion N/A 08/26/2013    Procedure: CERVICAL SIX TO SEVEN ANTERIOR CERVICAL DECOMPRESSION/DISCECTOMY FUSION 1 LEVEL;  Surgeon: Erline Levine, MD;  Location: [place]  NEURO ORS;  Service: Neurosurgery;  Laterality: N/A;  C6-7 Anterior cervical decompression/diskectomy/fusion  . Eye surgery      benign tumor of eye  . Esophagogastroduodenoscopy (egd) with propofol N/A 01/14/2016    Procedure: ESOPHAGOGASTRODUODENOSCOPY (EGD) WITH PROPOFOL;  Surgeon: Manya Silvas, MD;  Location: [place];  Service: Endoscopy;  Laterality: N/A;  . Colonoscopy with propofol N/A 01/14/2016    Procedure: COLONOSCOPY WITH PROPOFOL;  Surgeon: Manya Silvas, MD;  Location: [facility] ENDOSCOPY;  Service: Endoscopy;  Laterality: N/A;   Family History  Problem Relation Age of Onset  . Heart disease Mother   . Hypertension Mother   . COPD Mother     was a smoker  . Hypertension Brother   . Colon cancer Father   . Asthma Maternal Grandfather    Allergies  Allergen Reactions  . Ciprofloxacin     Vomiting   . Codeine Sulfate Nausea And Vomiting  . Keflex [Cephalexin] Nausea And Vomiting  . Macrobid [Nitrofurantoin Macrocrystal] Nausea And Vomiting  . Sulfa Antibiotics Nausea And Vomiting   Previous Medications   ALBUTEROL (PROAIR HFA) 108 (90 BASE) MCG/ACT INHALER    Inhale 1 puff into the lungs every 4 (four) hours as needed for wheezing or shortness of breath.  CINNAMON 500 MG CAPSULE    Take 500 mg by mouth daily.   DIAZEPAM (VALIUM) 2 MG TABLET    Take 1 tablet (2 mg total) by mouth 3 (three) times daily as needed for anxiety.   DOCUSATE CALCIUM (STOOL SOFTENER PO)    Take by mouth.   FLUCONAZOLE (DIFLUCAN) 150 MG TABLET    Take by mouth. Reported on 02/28/2016   HYDRALAZINE (APRESOLINE) 50 MG TABLET    Take 1 tablet (50 mg total) by mouth 3 (three) times daily.   LORATADINE (CLARITIN) 10 MG TABLET    Take 1 tablet (10 mg total) by mouth daily.   MECLIZINE (ANTIVERT) 25 MG TABLET    Take 1 tablet (25 mg total) by mouth daily as needed for dizziness.   METOPROLOL SUCCINATE (TOPROL-XL) 25 MG 24 HR TABLET    Take 1 tablet (25 mg total) by mouth daily.   MULTIPLE VITAMIN  (MULTIVITAMIN) TABLET    Take 1 tablet by mouth daily.   OVER THE COUNTER MEDICATION    Clear lung   PANTOPRAZOLE (PROTONIX) 40 MG TABLET    Take 1 tablet (40 mg total) by mouth 2 (two) times daily.   PAROXETINE (PAXIL) 20 MG TABLET    Take 1 tablet (20 mg total) by mouth daily.   RED YEAST RICE EXTRACT (RED YEAST RICE PO)    Take by mouth.   TRIAMTERENE-HYDROCHLOROTHIAZIDE (DYAZIDE) 37.5-25 MG CAPSULE    Take 1 each (1 capsule total) by mouth daily.   VERAPAMIL (CALAN-SR) 180 MG CR TABLET    Take 1 tablet (180 mg total) by mouth 2 (two) times daily.    Review of Systems  Constitutional: Positive for chills, activity change, appetite change and fatigue.  HENT: Positive for congestion, rhinorrhea, sore throat and voice change.   Respiratory: Positive for cough and shortness of breath.   Cardiovascular: Positive for chest pain.  Neurological: Positive for weakness and headaches.    Social History  Substance Use Topics  . Smoking status: Never Smoker   . 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 Alcohol Use: No     Comment: rare use 1-2 drinks per year.    Objective:   BP 124/52 mmHg  Pulse 68  Temp(Src) 97.9 F (36.6 C)  Resp 18  Wt 167 lb (75.751 kg)  SpO2 98%  Physical Exam  Constitutional: She is oriented to person, place, and time. She appears well-developed and well-nourished.  HENT:  Head: Normocephalic.  Right Ear: External ear normal.  Left Ear: External ear normal.  Mouth/Throat: Oropharynx is clear and moist.  Tender maxillary sinuses with good transillumination.  Eyes: Conjunctivae are normal.  Neck: Neck supple.  Cardiovascular: Normal rate and regular rhythm.   Pulmonary/Chest: Effort normal and breath sounds normal.  Abdominal: Soft. Bowel sounds are normal.  Neurological: She is alert and oriented to person, place, and time.  Psychiatric: She has a normal mood and affect. Her behavior is normal.       Assessment & Plan:     1. Acute maxillary sinusitis, recurrence not specified Onset over the past week or two with post nasal drip, hoarseness, chills, sinus pain and sore throat. No fever today but ticklish cough with tender maxillary sinuses. Will treat with antibiotic, refill albuterol prn wheeze and add Muxincex-DM or Delsym for cough. May add Claritin prn rhinorrhea or sneezing. Recheck if no better in 5-7 days. - azithromycin (ZITHROMAX)  250 MG tablet; Take 2 tablets by mouth today then one daily for 4 days.  Dispense: 6 tablet; Refill: 0 - albuterol (PROAIR HFA) 108 (90 Base) MCG/ACT inhaler; Inhale 1 puff into the lungs every 4 (four) hours as needed for wheezing or shortness of breath.  Dispense: 3 Inhaler; Refill: [place], [facility]

## 2016-03-09 ENCOUNTER — Other Ambulatory Visit: Payer: Self-pay | Admitting: Family Medicine

## 2016-03-10 ENCOUNTER — Ambulatory Visit (INDEPENDENT_AMBULATORY_CARE_PROVIDER_SITE_OTHER): Payer: PPO | Admitting: Physician Assistant

## 2016-03-10 VITALS — BP 116/58 | HR 74 | Temp 97.8°F | Resp 16 | Wt 165.0 lb

## 2016-03-10 DIAGNOSIS — R102 Pelvic and perineal pain: Secondary | ICD-10-CM | POA: Diagnosis not present

## 2016-03-10 DIAGNOSIS — R300 Dysuria: Secondary | ICD-10-CM | POA: Diagnosis not present

## 2016-03-10 LAB — POCT URINALYSIS DIPSTICK
Bilirubin, UA: NEGATIVE
Blood, UA: NEGATIVE
Glucose, UA: NEGATIVE
Ketones, UA: NEGATIVE
Leukocytes, UA: NEGATIVE
Nitrite, UA: NEGATIVE
Protein, UA: NEGATIVE
Spec Grav, UA: 1.015
Urobilinogen, UA: 0.2
pH, UA: 6

## 2016-03-10 MED ORDER — DOXYCYCLINE HYCLATE 100 MG PO TABS
100.0000 mg | ORAL_TABLET | Freq: Two times a day (BID) | ORAL | Status: DC
Start: 2016-03-10 — End: 2016-04-20

## 2016-03-10 NOTE — Progress Notes (Signed)
Patient ID: Jessica Armstrong, female   DOB: Jan 22, 1946, 70 y.o.   MRN: [id number]       [name] Female    DOB: 1945-10-31   70 y.o.   MRN: [id number] Visit Date: 03/10/2016  Today's Provider: Mar Daring, PA-C   Chief Complaint  Patient presents with  . Pelvic Pain   Subjective:    HPI  Patient states that she developed sharp pelvic pain suddenly about 1 week ago or so. Pain is still present and it is worse when she urinates, she does have some burning with urination and slight odor, she has had nausea and chills. No frequency or urgency that she can tell so far.She has previously had a history of recurrent UTIs but has not had one for approximately one year. She denies any vaginal irritation or vaginal discharge.    Allergies  Allergen Reactions  . Ciprofloxacin     Vomiting   . Codeine Sulfate Nausea And Vomiting  . Keflex [Cephalexin] Nausea And Vomiting  . Macrobid [Nitrofurantoin Macrocrystal] Nausea And Vomiting  . Sulfa Antibiotics Nausea And Vomiting   Previous Medications   ALBUTEROL (PROAIR HFA) 108 (90 BASE) MCG/ACT INHALER    Inhale 1 puff into the lungs every 4 (four) hours as needed for wheezing or shortness of breath.   CINNAMON 500 MG CAPSULE    Take 500 mg by mouth daily.   DIAZEPAM (VALIUM) 2 MG TABLET    TAKE ONE TABLET BY MOUTH 3 TIMES DAILY AS NEEDED FOR ANXIETY   DOCUSATE CALCIUM (STOOL SOFTENER PO)    Take by mouth.   FLUCONAZOLE (DIFLUCAN) 150 MG TABLET    Take by mouth. Reported on 03/10/2016   HYDRALAZINE (APRESOLINE) 50 MG TABLET    Take 1 tablet (50 mg total) by mouth 3 (three) times daily.   LORATADINE (CLARITIN) 10 MG TABLET    Take 1 tablet (10 mg total) by mouth daily.   MECLIZINE (ANTIVERT) 25 MG TABLET    Take 1 tablet (25 mg total) by mouth daily as needed for dizziness.   METOPROLOL SUCCINATE (TOPROL-XL) 25 MG 24 HR TABLET    Take 1 tablet (25 mg total) by mouth daily.   MULTIPLE VITAMIN (MULTIVITAMIN) TABLET    Take 1 tablet  by mouth daily.   OVER THE COUNTER MEDICATION    Clear lung   PANTOPRAZOLE (PROTONIX) 40 MG TABLET    Take 1 tablet (40 mg total) by mouth 2 (two) times daily.   PAROXETINE (PAXIL) 20 MG TABLET    Take 1 tablet (20 mg total) by mouth daily.   RED YEAST RICE EXTRACT (RED YEAST RICE PO)    Take by mouth.   TRIAMTERENE-HYDROCHLOROTHIAZIDE (DYAZIDE) 37.5-25 MG CAPSULE    Take 1 each (1 capsule total) by mouth daily.   VERAPAMIL (CALAN-SR) 180 MG CR TABLET    Take 1 tablet (180 mg total) by mouth 2 (two) times daily.    Review of Systems  Constitutional: Positive for chills and fatigue. Negative for fever.  Respiratory: Negative.   Cardiovascular: Negative.   Gastrointestinal: Positive for nausea and abdominal pain (lower abdomen). Negative for vomiting.  Genitourinary: Positive for dysuria, flank pain and pelvic pain. Negative for urgency, frequency, hematuria, decreased urine volume, vaginal bleeding, vaginal discharge and vaginal pain.  Musculoskeletal: Positive for back pain.    Social History  Substance Use Topics  . Smoking status: Never Smoker   . 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 Alcohol Use: No     Comment: rare use 1-2 drinks per year.    Objective:   BP 116/58 mmHg  Pulse 74  Temp(Src) 97.8 F (36.6 C)  Resp 16  Wt 165 lb (74.844 kg)  Physical Exam  Constitutional: She is oriented to person, place, and time. She appears well-developed and well-nourished. No distress.  Cardiovascular: Normal rate, regular rhythm and normal heart sounds.  Exam reveals no gallop and no friction rub.   No murmur heard. Pulmonary/Chest: Effort normal and breath sounds normal. No respiratory distress. She has no wheezes. She has no rales.  Abdominal: Soft. Normal appearance and bowel sounds are normal. She exhibits no distension and no mass. There is no hepatosplenomegaly. There is tenderness in the suprapubic area. There is no  rebound, no guarding and no CVA tenderness.  Suprapubic tenderness  Neurological: She is alert and oriented to person, place, and time.  Skin: Skin is warm and dry. She is not diaphoretic.  Vitals reviewed.       Assessment & Plan:     1. Pelvic pain in female UA was negative today in the office but due to tenderness on exam and complaints of dysuria I will go ahead and treat her empirically with doxycycline. This is worked well for her in the past. I advised her to make sure to continue to push fluids and to try cranberry juice for the discomfort. She voiced understanding. I will also send her urine for culture and will adjust antibiotic therapy as needed pending the culture and sensitivity results. She is to call the office if symptoms fail to improve or worsen. - POCT urinalysis dipstick - doxycycline (VIBRA-TABS) 100 MG tablet; Take 1 tablet (100 mg total) by mouth 2 (two) times daily.  Dispense: 14 tablet; Refill: 0 - Urine Culture  2. Dysuria See above medical treatment plan. - doxycycline (VIBRA-TABS) 100 MG tablet; Take 1 tablet (100 mg total) by mouth 2 (two) times daily.  Dispense: 14 tablet; Refill: 0 - Urine Culture       Mar Daring, PA-C  [facility]

## 2016-03-10 NOTE — Patient Instructions (Signed)

## 2016-03-12 LAB — URINE CULTURE

## 2016-03-13 ENCOUNTER — Telehealth: Payer: Self-pay

## 2016-03-13 DIAGNOSIS — E7800 Pure hypercholesterolemia, unspecified: Secondary | ICD-10-CM | POA: Diagnosis not present

## 2016-03-13 DIAGNOSIS — I10 Essential (primary) hypertension: Secondary | ICD-10-CM | POA: Diagnosis not present

## 2016-03-13 DIAGNOSIS — I5032 Chronic diastolic (congestive) heart failure: Secondary | ICD-10-CM | POA: Diagnosis not present

## 2016-03-13 NOTE — Telephone Encounter (Signed)
Patient advised as directed below.  Thanks,  -Ambri Miltner 

## 2016-03-13 NOTE — Telephone Encounter (Signed)
-----   Message from Mar Daring, PA-C sent at 03/13/2016  8:31 AM EDT ----- Urine culture grew out E. Coli and is susceptible to antibiotic you were placed on. Continue until completed and call if symptoms fail to improve once completed.

## 2016-03-13 NOTE — Telephone Encounter (Signed)
LM with Laurey Arrow for patient to return call.  Thanks,  -Joseline

## 2016-04-05 ENCOUNTER — Telehealth: Payer: Self-pay | Admitting: Cardiovascular Disease

## 2016-04-05 NOTE — Telephone Encounter (Signed)
Lm with spouse to call our office to schedule a Echocardiogram (1 yr f/u 04/29/15).

## 2016-04-14 NOTE — Telephone Encounter (Signed)
error 

## 2016-04-20 ENCOUNTER — Encounter: Payer: Self-pay | Admitting: Family Medicine

## 2016-04-20 ENCOUNTER — Ambulatory Visit (INDEPENDENT_AMBULATORY_CARE_PROVIDER_SITE_OTHER): Payer: PPO | Admitting: Family Medicine

## 2016-04-20 VITALS — BP 124/72 | HR 72 | Temp 98.1°F | Resp 20 | Ht 66.5 in | Wt 163.0 lb

## 2016-04-20 DIAGNOSIS — J40 Bronchitis, not specified as acute or chronic: Secondary | ICD-10-CM | POA: Diagnosis not present

## 2016-04-20 DIAGNOSIS — I5032 Chronic diastolic (congestive) heart failure: Secondary | ICD-10-CM | POA: Diagnosis not present

## 2016-04-20 MED ORDER — ALBUTEROL SULFATE HFA 108 (90 BASE) MCG/ACT IN AERS: 1.0000 | INHALATION_SPRAY | RESPIRATORY_TRACT | Status: DC | PRN

## 2016-04-20 MED ORDER — AZITHROMYCIN 250 MG PO TABS: ORAL_TABLET | ORAL | Status: DC

## 2016-04-20 MED ORDER — BENZONATATE 200 MG PO CAPS: 200.0000 mg | ORAL_CAPSULE | Freq: Two times a day (BID) | ORAL | Status: DC | PRN

## 2016-04-20 NOTE — Patient Instructions (Signed)

## 2016-04-20 NOTE — Progress Notes (Signed)
[name] Female    DOB: [birth year]   70 y.o.   MRN: [id number] Visit Date: 04/20/2016  Today's Provider: Vernie Murders, PA   Chief Complaint  Patient presents with  . URI   Subjective:    URI  This is a new problem. The current episode started 1 to 4 weeks ago. There has been no fever. Associated symptoms include congestion, coughing, headaches, nausea, a plugged ear sensation, rhinorrhea, sinus pain and a sore throat. She has tried decongestant and antihistamine for the symptoms. The treatment provided mild relief.   Past Medical History  Diagnosis Date  . Hypercholesterolemia   . Post-menopausal   . Hypertension   . Claudication ([place])   . Hyperglycemia   . Cystitis   . Palpitations   . Neuropathy ([place])   . Colon polyp   . History of measles   . History of mumps   . Pulmonary valve dysplasia   . PONV (postoperative nausea and vomiting)     severe n/v after anesthesia  . Seasonal allergies   . H/O vertigo   . GERD (gastroesophageal reflux disease)   . Depression     situational  . Anemia     mild; no medical treatment  . Arthritis   . Need for SBE (subacute bacterial endocarditis) prophylaxis   . Tricuspid regurgitation     a. echo 04/2013: EF 55-60%, nl wall motion, mild to mod TR, PASP 35 mm Hg  . Cervical spondyloarthritis   . Carotid artery stenosis     a. doppler [id number]: RICA 123456, LICA 123456 f/u 1 year  . Chronic diastolic CHF (congestive heart failure) ([place])     a. echo 04/2015: EF 55-60%, no RWMA, mitral valve w/ systolic bowing w/o prolapse, PASP 53 mmHg   Past Surgical History  Procedure Laterality Date  . Cholecystectomy    . Abdominal hysterectomy    . Anterior cervical decomp/discectomy fusion N/A 08/26/2013    Procedure: CERVICAL SIX TO SEVEN ANTERIOR CERVICAL DECOMPRESSION/DISCECTOMY FUSION 1 LEVEL;  Surgeon: Erline Levine, MD;  Location: [facility];  Service: Neurosurgery;  Laterality: N/A;  C6-7 Anterior cervical  decompression/diskectomy/fusion  . Eye surgery      benign tumor of eye  . Esophagogastroduodenoscopy (egd) with propofol N/A 01/14/2016    Procedure: ESOPHAGOGASTRODUODENOSCOPY (EGD) WITH PROPOFOL;  Surgeon: Manya Silvas, MD;  Location: [facility];  Service: Endoscopy;  Laterality: N/A;  . Colonoscopy with propofol N/A 01/14/2016    Procedure: COLONOSCOPY WITH PROPOFOL;  Surgeon: Manya Silvas, MD;  Location: [facility];  Service: Endoscopy;  Laterality: N/A;   Family History  Problem Relation Age of Onset  . Heart disease Mother   . Hypertension Mother   . COPD Mother     was a smoker  . Hypertension Brother   . Colon cancer Father   . Asthma Maternal Grandfather    Allergies  Allergen Reactions  . Ciprofloxacin     Vomiting   . Codeine Sulfate Nausea And Vomiting  . Keflex [Cephalexin] Nausea And Vomiting  . Macrobid [Nitrofurantoin Macrocrystal] Nausea And Vomiting  . Sulfa Antibiotics Nausea And Vomiting   Current Meds  Medication Sig  . albuterol (PROAIR HFA) 108 (90 Base) MCG/ACT inhaler Inhale 1 puff into the lungs every 4 (four) hours as needed for wheezing or shortness of breath.  . Cinnamon 500 MG capsule Take 500 mg by mouth daily.  . diazepam (VALIUM) 2 MG tablet TAKE ONE TABLET BY  MOUTH 3 TIMES DAILY AS NEEDED FOR ANXIETY  . Docusate Calcium (STOOL SOFTENER PO) Take by mouth.  . fluconazole (DIFLUCAN) 150 MG tablet Take by mouth. Reported on 03/10/2016  . hydrALAZINE (APRESOLINE) 50 MG tablet Take 1 tablet (50 mg total) by mouth 3 (three) times daily.  Marland Kitchen loratadine (CLARITIN) 10 MG tablet Take 1 tablet (10 mg total) by mouth daily.  . meclizine (ANTIVERT) 25 MG tablet Take 1 tablet (25 mg total) by mouth daily as needed for dizziness.  . metoprolol succinate (TOPROL-XL) 25 MG 24 hr tablet Take 1 tablet (25 mg total) by mouth daily.  . Multiple Vitamin (MULTIVITAMIN) tablet Take 1 tablet by mouth daily.  Marland Kitchen OVER THE COUNTER MEDICATION Clear lung  .  pantoprazole (PROTONIX) 40 MG tablet Take 1 tablet (40 mg total) by mouth 2 (two) times daily.  Marland Kitchen PARoxetine (PAXIL) 20 MG tablet Take 1 tablet (20 mg total) by mouth daily.  . Red Yeast Rice Extract (RED YEAST RICE PO) Take by mouth.  . triamterene-hydrochlorothiazide (DYAZIDE) 37.5-25 MG capsule Take 1 each (1 capsule total) by mouth daily.  . verapamil (CALAN-SR) 180 MG CR tablet Take 1 tablet (180 mg total) by mouth 2 (two) times daily.  . [DISCONTINUED] doxycycline (VIBRA-TABS) 100 MG tablet Take 1 tablet (100 mg total) by mouth 2 (two) times daily.    Review of Systems  HENT: Positive for congestion, rhinorrhea and sore throat.   Respiratory: Positive for cough.   Gastrointestinal: Positive for nausea.  Neurological: Positive for headaches.    Social History  Substance Use Topics  . Smoking status: Never Smoker   . 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 Alcohol Use: No     Comment: rare use 1-2 drinks per year.    Objective:   BP 124/72 mmHg  Pulse 72  Temp(Src) 98.1 F (36.7 C)  Resp 20  Ht 5' 6.5" (1.689 m)  Wt 163 lb (73.936 kg)  BMI 25.92 kg/m2  SpO2 97% Wt Readings from Last 3 Encounters:  04/20/16 163 lb (73.9 kg)  03/10/16 165 lb (74.8 kg)  02/28/16 167 lb (75.8 kg)    Physical Exam  Constitutional: She is oriented to person, place, and time. She appears well-developed and well-nourished.  HENT:  Head: Normocephalic.  Right Ear: External ear normal.  Left Ear: External ear normal.  Nose: Nose normal.  Cobblestone appearance of posterior pharynx. No exudates.  Eyes: Conjunctivae and EOM are normal.  Neck: Normal range of motion. Neck supple.  Cardiovascular: Normal rate and regular rhythm.   Pulmonary/Chest: Effort normal and breath sounds normal.  Abdominal: Bowel sounds are normal.  Musculoskeletal: She exhibits no edema.  Neurological: She is alert and oriented to person, place, and time.       Assessment & Plan:     1. Bronchitis Onset of cough, sore throat and congestion over the past couple weeks. No fever but some wheezing occasionally. Worried this will progress to pneumonia. Will refill Albuterol, give Benzonatate for cough control and give Azithromycin for bronchitis. Increase fluid intake and recheck prn. - albuterol (PROAIR HFA) 108 (90 Base) MCG/ACT inhaler; Inhale 1 puff into the lungs every 4 (four) hours as needed for wheezing or shortness of breath.  Dispense: 3 Inhaler; Refill: 3 - benzonatate (TESSALON) 200 MG capsule; Take 1 capsule (200 mg total) by mouth 2 (two) times daily as needed for cough.  Dispense: 20 capsule; Refill:  0 - azithromycin (ZITHROMAX) 250 MG tablet; Take 2 tablets by mouth today then one daily for 4 days.  Dispense: 6 tablet; Refill: 0  2. Chronic diastolic CHF (congestive heart failure) (HCC) No peripheral edema today. History of EF 55-60% with mitral valve bulging on echocardiogram by Dr. Candis Musa in July 2016. No significant dyspnea or weight gain today. Continue follow up with cardiologist as planned.       Vernie Murders, PA  [facility]

## 2016-06-06 ENCOUNTER — Encounter: Payer: Self-pay | Admitting: *Deleted

## 2016-06-30 ENCOUNTER — Encounter: Payer: Self-pay | Admitting: Family Medicine

## 2016-06-30 ENCOUNTER — Ambulatory Visit (INDEPENDENT_AMBULATORY_CARE_PROVIDER_SITE_OTHER): Payer: PPO | Admitting: Family Medicine

## 2016-06-30 VITALS — BP 128/60 | HR 64 | Temp 97.8°F | Resp 16 | Wt 164.0 lb

## 2016-06-30 DIAGNOSIS — I10 Essential (primary) hypertension: Secondary | ICD-10-CM

## 2016-06-30 DIAGNOSIS — E785 Hyperlipidemia, unspecified: Secondary | ICD-10-CM | POA: Diagnosis not present

## 2016-06-30 DIAGNOSIS — R739 Hyperglycemia, unspecified: Secondary | ICD-10-CM | POA: Diagnosis not present

## 2016-06-30 DIAGNOSIS — E039 Hypothyroidism, unspecified: Secondary | ICD-10-CM

## 2016-06-30 DIAGNOSIS — R5382 Chronic fatigue, unspecified: Secondary | ICD-10-CM

## 2016-06-30 DIAGNOSIS — R42 Dizziness and giddiness: Secondary | ICD-10-CM

## 2016-06-30 DIAGNOSIS — G629 Polyneuropathy, unspecified: Secondary | ICD-10-CM | POA: Diagnosis not present

## 2016-06-30 NOTE — Progress Notes (Signed)
Subjective:  HPI  Pt is here today for a follow up of her labs. Her thyroid was elevated on 12/07/15 and pt was advised to start Synthroid but pt wanted to think about it and follow it up first. Pt states that she heard that when someone goes on it, they do not come off of it. Pt was to follow up in 3 months. She reports that she is always tired and does not have the energy to do anything. Her blood sugar was also elevated at 6.1% and her cholesterol was elevated as well.  Most of the issues we reviewed today are chronic but she wishes to have them addressed.  Prior to Admission medications   Medication Sig Start Date End Date Taking? Authorizing Provider  albuterol (PROAIR HFA) 108 (90 Base) MCG/ACT inhaler Inhale 1 puff into the lungs every 4 (four) hours as needed for wheezing or shortness of breath. 04/20/16   Vickki Muff Chrismon, PA  azithromycin (ZITHROMAX) 250 MG tablet Take 2 tablets by mouth today then one daily for 4 days. 04/20/16   Vickki Muff Chrismon, PA  benzonatate (TESSALON) 200 MG capsule Take 1 capsule (200 mg total) by mouth 2 (two) times daily as needed for cough. 04/20/16   Vickki Muff Chrismon, PA  Cinnamon 500 MG capsule Take 500 mg by mouth daily.    Historical Provider, MD  diazepam (VALIUM) 2 MG tablet TAKE ONE TABLET BY MOUTH 3 TIMES DAILY AS NEEDED FOR ANXIETY 03/10/16   Jerrol Banana., MD  Docusate Calcium (STOOL SOFTENER PO) Take by mouth.    Historical Provider, MD  fluconazole (DIFLUCAN) 150 MG tablet Take by mouth. Reported on 03/10/2016 02/11/15   Historical Provider, MD  hydrALAZINE (APRESOLINE) 50 MG tablet Take 1 tablet (50 mg total) by mouth 3 (three) times daily. 12/07/15   Richard Maceo Pro., MD  loratadine (CLARITIN) 10 MG tablet Take 1 tablet (10 mg total) by mouth daily. 12/07/15   Richard Maceo Pro., MD  meclizine (ANTIVERT) 25 MG tablet Take 1 tablet (25 mg total) by mouth daily as needed for dizziness. 12/07/15   Richard Maceo Pro., MD  metoprolol  succinate (TOPROL-XL) 25 MG 24 hr tablet Take 1 tablet (25 mg total) by mouth daily. 12/07/15 12/06/16  Jerrol Banana., MD  Multiple Vitamin (MULTIVITAMIN) tablet Take 1 tablet by mouth daily.    Historical Provider, MD  OVER THE COUNTER MEDICATION Clear lung    Historical Provider, MD  pantoprazole (PROTONIX) 40 MG tablet Take 1 tablet (40 mg total) by mouth 2 (two) times daily. 11/24/15   Richard Maceo Pro., MD  PARoxetine (PAXIL) 20 MG tablet Take 1 tablet (20 mg total) by mouth daily. 12/07/15   Jerrol Banana., MD  Red Yeast Rice Extract (RED YEAST RICE PO) Take by mouth.    Historical Provider, MD  triamterene-hydrochlorothiazide (DYAZIDE) 37.5-25 MG capsule Take 1 each (1 capsule total) by mouth daily. 12/07/15 12/06/16  Jerrol Banana., MD  verapamil (CALAN-SR) 180 MG CR tablet Take 1 tablet (180 mg total) by mouth 2 (two) times daily. 12/07/15   Jerrol Banana., MD    Patient Active Problem List   Diagnosis Date Noted  . Carotid artery stenosis   . Chronic diastolic CHF (congestive heart failure) ([place])   . Fatigue 04/19/2015  . Anxiety 04/19/2015  . Allergic rhinitis 04/19/2015  . Arthritis 04/19/2015  . 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 Diverticulitis 04/19/2015  . Acid reflux 04/19/2015  . Hormone replacement therapy (postmenopausal) 04/19/2015  . Hypercholesteremia 04/19/2015  . Blood glucose elevated 04/19/2015  . Neuropathy ([place]) 04/19/2015  . Pericarditis 04/19/2015  . Common wart 04/19/2015  . Hot flashes 06/10/2013  . Hemoptysis 04/21/2013  . Chest pain 04/08/2013  . SOB (shortness of breath) 04/08/2013  . Cough 04/08/2013  . Murmur 04/08/2013  . Essential hypertension 04/08/2013    Past Medical History:  Diagnosis Date  . Anemia    mild; no medical treatment  . Arthritis   . Carotid artery stenosis    a. doppler [id number]: RICA 123456, LICA 123456 f/u 1 year  . Cervical spondyloarthritis   . Chronic  diastolic CHF (congestive heart failure) ([place])    a. echo 04/2015: EF 55-60%, no RWMA, mitral valve w/ systolic bowing w/o prolapse, PASP 53 mmHg  . Claudication ([place])   . Colon polyp   . Cystitis   . Depression    situational  . GERD (gastroesophageal reflux disease)   . H/O vertigo   . History of measles   . History of mumps   . Hypercholesterolemia   . Hyperglycemia   . Hypertension   . Need for SBE (subacute bacterial endocarditis) prophylaxis   . Neuropathy ([place])   . Palpitations   . PONV (postoperative nausea and vomiting)    severe n/v after anesthesia  . Post-menopausal   . Pulmonary valve dysplasia   . Seasonal allergies   . Tricuspid regurgitation    a. echo 04/2013: EF 55-60%, nl wall motion, mild to mod TR, PASP 35 mm Hg    Social History   Social History  . Marital status: Married    Spouse name: N/A  . Number of children: N/A  . Years of education: N/A   Occupational History  . Not on file.   Social History Main Topics  . Smoking status: Never Smoker  . 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 Alcohol use No     Comment: rare use 1-2 drinks per year.   . Drug use: No  . Sexual activity: Not on file   Other Topics Concern  . Not on file   Social History Narrative  . No narrative on file    Allergies  Allergen Reactions  . Ciprofloxacin     Vomiting   . Codeine Sulfate Nausea And Vomiting  . Keflex [Cephalexin] Nausea And Vomiting  . Macrobid [Nitrofurantoin Macrocrystal] Nausea And Vomiting  . Sulfa Antibiotics Nausea And Vomiting    Review of Systems  Constitutional: Positive for malaise/fatigue.  HENT: Negative.   Eyes: Negative.   Respiratory: Negative.   Cardiovascular: Negative.   Gastrointestinal: Negative.   Genitourinary: Negative.   Musculoskeletal: Positive for myalgias.  Skin: Negative.   Neurological: Negative.   Endo/Heme/Allergies: Negative.     Psychiatric/Behavioral: Negative.     Immunization History  Administered Date(s) Administered  . Influenza Whole 07/09/2012  . Influenza-Unspecified 09/10/2015  . Pneumococcal Conjugate-13 12/07/2015  . Pneumococcal Polysaccharide-23 11/23/2011  . Zoster 08/06/2013   Objective:  BP 128/60 (BP Location: Left Arm, Patient Position: Sitting, Cuff Size: Normal)   Pulse 64   Temp 97.8 F (36.6 C) (Oral)   Resp 16   Wt 164 lb (74.4 kg)   BMI 26.07 kg/m   Physical Exam  Constitutional: She is oriented to person, place, and time and well-developed, well-nourished, and in  no distress.  HENT:  Head: Normocephalic and atraumatic.  Eyes: Conjunctivae and EOM are normal. Pupils are equal, round, and reactive to light.  Neck: Normal range of motion. Neck supple.  Cardiovascular: Normal rate, regular rhythm, normal heart sounds and intact distal pulses.   Pulmonary/Chest: Effort normal and breath sounds normal.  Musculoskeletal: Normal range of motion.  Neurological: She is alert and oriented to person, place, and time. She has normal reflexes. Gait normal. GCS score is 15.  Skin: Skin is warm and dry.  Psychiatric: Mood, memory, affect and judgment normal.    Lab Results  Component Value Date   WBC 9.2 11/23/2015   HGB 12.6 02/11/2015   HCT 36.1 11/23/2015   PLT 300 11/23/2015   GLUCOSE 130 (H) 11/23/2015   CHOL 217 (H) 12/07/2015   TRIG 206 (H) 12/07/2015   HDL 53 12/07/2015   LDLCALC 123 (H) 12/07/2015   TSH 5.710 (H) 12/07/2015   HGBA1C 6.1 (H) 12/07/2015    CMP     Component Value Date/Time   NA 143 11/23/2015 1638   K 4.9 11/23/2015 1638   CL 105 11/23/2015 1638   CO2 21 11/23/2015 1638   GLUCOSE 130 (H) 11/23/2015 1638   GLUCOSE 157 (H) 05/12/2015 1336   BUN 20 11/23/2015 1638   CREATININE 0.84 11/23/2015 1638   CALCIUM 9.3 11/23/2015 1638   PROT 6.5 11/23/2015 1638   ALBUMIN 4.2 11/23/2015 1638   AST 14 11/23/2015 1638   ALT 11 11/23/2015 1638   ALKPHOS 91  11/23/2015 1638   BILITOT <0.2 11/23/2015 1638   GFRNONAA 70 11/23/2015 1638   GFRAA 81 11/23/2015 1638    Assessment and Plan :  1. Hypothyroidism, unspecified hypothyroidism type  - TSH  2. Hyperglycemia  - Hemoglobin A1c  3. Essential hypertension   4. Neuropathy (HCC)  - Vitamin B12  5. Dizziness  - Ambulatory referral to Cardiology  6. Chronic fatigue Patient says she has no snoring. - CBC with Differential/Platelet - VITAMIN D 25 Hydroxy (Vit-D Deficiency, Fractures) - Ambulatory referral to Cardiology  7. Hyperlipidemia  - Lipid Panel With LDL/HDL Ratio - Comprehensive metabolic panel 8. History of pericardial effusion  Miguel Aschoff MD [facility] 06/30/2016 8:27 AM

## 2016-07-01 LAB — LIPID PANEL WITH LDL/HDL RATIO
Cholesterol, Total: 190 mg/dL (ref 100–199)
HDL: 48 mg/dL (ref >39)
LDL Calculated: 100 mg/dL — ABNORMAL HIGH (ref 0–99)
LDl/HDL Ratio: 2.1 ratio units (ref 0.0–3.2)
Triglycerides: 209 mg/dL — ABNORMAL HIGH (ref 0–149)
VLDL Cholesterol Cal: 42 mg/dL — ABNORMAL HIGH (ref 5–40)

## 2016-07-01 LAB — COMPREHENSIVE METABOLIC PANEL
ALT: 14 IU/L (ref 0–32)
AST: 18 IU/L (ref 0–40)
Albumin/Globulin Ratio: 2 (ref 1.2–2.2)
Albumin: 4.5 g/dL (ref 3.5–4.8)
Alkaline Phosphatase: 84 IU/L (ref 39–117)
BUN/Creatinine Ratio: 27 (ref 12–28)
BUN: 27 mg/dL (ref 8–27)
Bilirubin Total: 0.3 mg/dL (ref 0.0–1.2)
CO2: 21 mmol/L (ref 18–29)
Calcium: 9.7 mg/dL (ref 8.7–10.3)
Chloride: 102 mmol/L (ref 96–106)
Creatinine, Ser: 0.99 mg/dL (ref 0.57–1.00)
GFR calc Af Amer: 66 mL/min/{1.73_m2} (ref >59)
GFR calc non Af Amer: 57 mL/min/{1.73_m2} — ABNORMAL LOW (ref >59)
Globulin, Total: 2.2 g/dL (ref 1.5–4.5)
Glucose: 120 mg/dL — ABNORMAL HIGH (ref 65–99)
Potassium: 5.3 mmol/L — ABNORMAL HIGH (ref 3.5–5.2)
Sodium: 141 mmol/L (ref 134–144)
Total Protein: 6.7 g/dL (ref 6.0–8.5)

## 2016-07-01 LAB — CBC WITH DIFFERENTIAL/PLATELET
Basophils Absolute: 0 10*3/uL (ref 0.0–0.2)
Basos: 0 %
EOS (ABSOLUTE): 0.2 10*3/uL (ref 0.0–0.4)
Eos: 3 %
Hematocrit: 35.4 % (ref 34.0–46.6)
Hemoglobin: 11.8 g/dL (ref 11.1–15.9)
Immature Grans (Abs): 0 10*3/uL (ref 0.0–0.1)
Immature Granulocytes: 0 %
Lymphocytes Absolute: 2 10*3/uL (ref 0.7–3.1)
Lymphs: 29 %
MCH: 30.6 pg (ref 26.6–33.0)
MCHC: 33.3 g/dL (ref 31.5–35.7)
MCV: 92 fL (ref 79–97)
Monocytes Absolute: 0.8 10*3/uL (ref 0.1–0.9)
Monocytes: 12 %
Neutrophils Absolute: 4 10*3/uL (ref 1.4–7.0)
Neutrophils: 56 %
Platelets: 302 10*3/uL (ref 150–379)
RBC: 3.85 x10E6/uL (ref 3.77–5.28)
RDW: 13.4 % (ref 12.3–15.4)
WBC: 7.1 10*3/uL (ref 3.4–10.8)

## 2016-07-01 LAB — VITAMIN B12: Vitamin B-12: 711 pg/mL (ref 211–946)

## 2016-07-01 LAB — HEMOGLOBIN A1C
Est. average glucose Bld gHb Est-mCnc: 126 mg/dL
Hgb A1c MFr Bld: 6 % — ABNORMAL HIGH (ref 4.8–5.6)

## 2016-07-01 LAB — VITAMIN D 25 HYDROXY (VIT D DEFICIENCY, FRACTURES): Vit D, 25-Hydroxy: 39.9 ng/mL (ref 30.0–100.0)

## 2016-07-01 LAB — TSH: TSH: 3.55 u[IU]/mL (ref 0.450–4.500)

## 2016-07-04 DIAGNOSIS — I10 Essential (primary) hypertension: Secondary | ICD-10-CM | POA: Diagnosis not present

## 2016-07-04 DIAGNOSIS — R0602 Shortness of breath: Secondary | ICD-10-CM | POA: Diagnosis not present

## 2016-07-04 DIAGNOSIS — I5032 Chronic diastolic (congestive) heart failure: Secondary | ICD-10-CM | POA: Diagnosis not present

## 2016-07-04 DIAGNOSIS — R5382 Chronic fatigue, unspecified: Secondary | ICD-10-CM | POA: Diagnosis not present

## 2016-07-04 DIAGNOSIS — R42 Dizziness and giddiness: Secondary | ICD-10-CM | POA: Diagnosis not present

## 2016-07-04 DIAGNOSIS — E7800 Pure hypercholesterolemia, unspecified: Secondary | ICD-10-CM | POA: Diagnosis not present

## 2016-07-04 DIAGNOSIS — I6529 Occlusion and stenosis of unspecified carotid artery: Secondary | ICD-10-CM | POA: Diagnosis not present

## 2016-07-04 DIAGNOSIS — I208 Other forms of angina pectoris: Secondary | ICD-10-CM | POA: Diagnosis not present

## 2016-07-07 DIAGNOSIS — R0602 Shortness of breath: Secondary | ICD-10-CM | POA: Diagnosis not present

## 2016-07-07 DIAGNOSIS — I5032 Chronic diastolic (congestive) heart failure: Secondary | ICD-10-CM | POA: Diagnosis not present

## 2016-07-11 DIAGNOSIS — I5032 Chronic diastolic (congestive) heart failure: Secondary | ICD-10-CM | POA: Diagnosis not present

## 2016-07-11 DIAGNOSIS — R0602 Shortness of breath: Secondary | ICD-10-CM | POA: Diagnosis not present

## 2016-07-11 DIAGNOSIS — I6521 Occlusion and stenosis of right carotid artery: Secondary | ICD-10-CM | POA: Diagnosis not present

## 2016-07-11 DIAGNOSIS — R011 Cardiac murmur, unspecified: Secondary | ICD-10-CM | POA: Diagnosis not present

## 2016-07-11 DIAGNOSIS — R002 Palpitations: Secondary | ICD-10-CM | POA: Diagnosis not present

## 2016-07-11 DIAGNOSIS — F411 Generalized anxiety disorder: Secondary | ICD-10-CM | POA: Diagnosis not present

## 2016-07-11 DIAGNOSIS — I208 Other forms of angina pectoris: Secondary | ICD-10-CM | POA: Diagnosis not present

## 2016-07-11 DIAGNOSIS — I10 Essential (primary) hypertension: Secondary | ICD-10-CM | POA: Diagnosis not present

## 2016-07-25 DIAGNOSIS — I6521 Occlusion and stenosis of right carotid artery: Secondary | ICD-10-CM | POA: Diagnosis not present

## 2016-07-25 DIAGNOSIS — I6523 Occlusion and stenosis of bilateral carotid arteries: Secondary | ICD-10-CM | POA: Diagnosis not present

## 2016-08-10 ENCOUNTER — Telehealth: Payer: Self-pay

## 2016-08-10 MED ORDER — BENZONATATE 100 MG PO CAPS: 100.0000 mg | ORAL_CAPSULE | Freq: Three times a day (TID) | ORAL | 0 refills | Status: DC | PRN

## 2016-08-10 NOTE — Telephone Encounter (Signed)
Benzonate 100mg  TID prn,#30. Appt if she needs other Rx.

## 2016-08-10 NOTE — Telephone Encounter (Signed)
Patient called and states for the past 2 days she has had a mainly dry cough, constant and it is keeping her up at night. No drainage, no SOB, no wheezing, no fever, no chills. No other symptoms she reports. She has been using OTC cough medications but is not helping a lot. She wanted to see if could send her in some Benzonatate she took before for this type of issue she had before. Please advise. Thank you-aa

## 2016-08-10 NOTE — Telephone Encounter (Signed)
Pt advised and she understood, RX sent in-aa

## 2016-08-10 NOTE — Telephone Encounter (Signed)
Patient wanted to let you know in case you did not get all the notes but she saw her cardiologist since last visit and he did echo, from the notes she read she thinks he thinks she has heart failure . He wanted to her to get into a study that helpes people with heart and sugar issues but she did not pass the screening part to get in. She wanted you to know this.

## 2016-09-25 ENCOUNTER — Other Ambulatory Visit: Payer: Self-pay | Admitting: Family Medicine

## 2016-10-11 ENCOUNTER — Ambulatory Visit: Payer: PPO | Admitting: Family Medicine

## 2016-11-10 DIAGNOSIS — M5412 Radiculopathy, cervical region: Secondary | ICD-10-CM | POA: Diagnosis not present

## 2016-11-10 DIAGNOSIS — M7541 Impingement syndrome of right shoulder: Secondary | ICD-10-CM | POA: Diagnosis not present

## 2016-11-17 DIAGNOSIS — M542 Cervicalgia: Secondary | ICD-10-CM | POA: Diagnosis not present

## 2016-11-17 DIAGNOSIS — M25511 Pain in right shoulder: Secondary | ICD-10-CM | POA: Diagnosis not present

## 2016-11-22 DIAGNOSIS — M25511 Pain in right shoulder: Secondary | ICD-10-CM | POA: Diagnosis not present

## 2016-11-22 DIAGNOSIS — M542 Cervicalgia: Secondary | ICD-10-CM | POA: Diagnosis not present

## 2016-11-24 DIAGNOSIS — M25511 Pain in right shoulder: Secondary | ICD-10-CM | POA: Diagnosis not present

## 2016-11-24 DIAGNOSIS — M542 Cervicalgia: Secondary | ICD-10-CM | POA: Diagnosis not present

## 2016-11-29 ENCOUNTER — Telehealth: Payer: Self-pay | Admitting: Family Medicine

## 2016-11-29 NOTE — Telephone Encounter (Signed)
Please advise, I do not see this on her list but she does have diagnosis of murmur and percarditis on her problem list.  Thanks ED

## 2016-11-29 NOTE — Telephone Encounter (Signed)
Pt stated that she has to take an antibiotic prior to her going to the dentist and she usually takes Amoxicillin. Pt is requesting it be sent to [facility]. Please advise. Thanks TNP

## 2016-11-30 ENCOUNTER — Other Ambulatory Visit: Payer: Self-pay

## 2016-11-30 MED ORDER — AMOXICILLIN 500 MG PO CAPS: 500.0000 mg | ORAL_CAPSULE | Freq: Three times a day (TID) | ORAL | 0 refills | Status: DC

## 2016-11-30 NOTE — Progress Notes (Unsigned)
amoxil

## 2016-11-30 NOTE — Telephone Encounter (Signed)
Sent to pharmacy  ED 

## 2016-11-30 NOTE — Telephone Encounter (Signed)
Ok to rf --will review on next OV for future.

## 2016-12-07 ENCOUNTER — Encounter: Payer: PPO | Admitting: Family Medicine

## 2016-12-19 ENCOUNTER — Other Ambulatory Visit: Payer: Self-pay | Admitting: Family Medicine

## 2016-12-19 ENCOUNTER — Ambulatory Visit (INDEPENDENT_AMBULATORY_CARE_PROVIDER_SITE_OTHER): Payer: PPO | Admitting: Family Medicine

## 2016-12-19 DIAGNOSIS — K130 Diseases of lips: Secondary | ICD-10-CM | POA: Diagnosis not present

## 2016-12-19 DIAGNOSIS — S0300XS Dislocation of jaw, unspecified side, sequela: Secondary | ICD-10-CM | POA: Diagnosis not present

## 2016-12-19 DIAGNOSIS — R05 Cough: Secondary | ICD-10-CM

## 2016-12-19 DIAGNOSIS — G4700 Insomnia, unspecified: Secondary | ICD-10-CM | POA: Diagnosis not present

## 2016-12-19 DIAGNOSIS — R739 Hyperglycemia, unspecified: Secondary | ICD-10-CM | POA: Diagnosis not present

## 2016-12-19 DIAGNOSIS — H00016 Hordeolum externum left eye, unspecified eyelid: Secondary | ICD-10-CM

## 2016-12-19 DIAGNOSIS — R42 Dizziness and giddiness: Secondary | ICD-10-CM

## 2016-12-19 DIAGNOSIS — R059 Cough, unspecified: Secondary | ICD-10-CM

## 2016-12-19 LAB — POCT GLYCOSYLATED HEMOGLOBIN (HGB A1C): Hemoglobin A1C: 6.1

## 2016-12-19 MED ORDER — LYSINE HCL 1000 MG PO TABS: 1.0000 | ORAL_TABLET | Freq: Every day | ORAL | 12 refills | Status: DC

## 2016-12-19 MED ORDER — DIAZEPAM 2 MG PO TABS: 2.0000 mg | ORAL_TABLET | Freq: Two times a day (BID) | ORAL | 0 refills | Status: DC | PRN

## 2016-12-19 MED ORDER — MECLIZINE HCL 25 MG PO TABS: 25.0000 mg | ORAL_TABLET | Freq: Every day | ORAL | 3 refills | Status: DC | PRN

## 2016-12-19 MED ORDER — BENZONATATE 100 MG PO CAPS: 100.0000 mg | ORAL_CAPSULE | Freq: Two times a day (BID) | ORAL | 0 refills | Status: DC | PRN

## 2016-12-19 MED ORDER — CIPROFLOXACIN HCL 0.3 % OP SOLN: 1.0000 [drp] | OPHTHALMIC | 0 refills | Status: DC

## 2016-12-19 MED ORDER — MELATONIN 10 MG PO TABS: 1.0000 | ORAL_TABLET | Freq: Every evening | ORAL | 12 refills | Status: DC | PRN

## 2016-12-19 NOTE — Progress Notes (Signed)
[name], Female    DOB: [birth year], 71 y.o.   MRN: [id number] Visit Date: 12/19/2016  Today's Provider: Wilhemena Durie, MD   Chief Complaint  Patient presents with  . Annual Exam   Subjective:   Jessica Armstrong is a 71 y.o. female who presents today for her Subsequent Annual Wellness Visit. She feels well. She reports exercising 4 times weekly. She reports she is sleeping poorly.  Immunization History  Administered Date(s) Administered  . Influenza Whole 07/09/2012  . Influenza, High Dose Seasonal PF 07/11/2016  . Influenza-Unspecified 09/10/2015  . Pneumococcal Conjugate-13 12/07/2015  . Pneumococcal Polysaccharide-23 11/23/2011  . Zoster 08/06/2013     10/25/09 Pap 01/31/16 Mamm 01/14/16 Colon-Polyps (negative pathology), Diverticula, internal hemorrhoids.  Repeat in 2022  Review of Systems  Constitutional: Negative.   HENT: Negative.   Eyes: Negative.   Respiratory: Negative.   Cardiovascular: Negative.   Gastrointestinal: Negative.   Endocrine: Negative.   Genitourinary: Negative.   Musculoskeletal: Negative.   Skin: Negative.   Allergic/Immunologic: Negative.   Neurological: Negative.   Hematological: Negative.   Psychiatric/Behavioral: Negative.     Patient Active Problem List   Diagnosis Date Noted  . Dizziness 06/30/2016  . Carotid artery stenosis   . Chronic diastolic CHF (congestive heart failure) ([place])   . Fatigue 04/19/2015  . Anxiety 04/19/2015  . Allergic rhinitis 04/19/2015  . Arthritis 04/19/2015  . Disorder of carbohydrate transport and metabolism ([place]) 04/19/2015  . Diverticulitis 04/19/2015  . Acid reflux 04/19/2015  . Hormone replacement therapy (postmenopausal) 04/19/2015  . Hypercholesteremia 04/19/2015  . Blood glucose elevated 04/19/2015  . Neuropathy ([place]) 04/19/2015  . Pericarditis 04/19/2015  . Common wart 04/19/2015  . Hot flashes 06/10/2013  . Hemoptysis 04/21/2013  . Chest pain 04/08/2013  . SOB (shortness of  breath) 04/08/2013  . Cough 04/08/2013  . Murmur 04/08/2013  . Essential hypertension 04/08/2013    Social History   Social History  . Marital status: Married    Spouse name: N/A  . Number of children: N/A  . Years of education: N/A   Occupational History  . Not on file.   Social History Main Topics  . Smoking status: Never Smoker  . 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 Alcohol use No     Comment: rare use 1-2 drinks per year.   . Drug use: No  . Sexual activity: Not on file   Other Topics Concern  . Not on file   Social History Narrative  . No narrative on file    Past Surgical History:  Procedure Laterality Date  . ABDOMINAL HYSTERECTOMY    . ANTERIOR CERVICAL DECOMP/DISCECTOMY FUSION N/A 08/26/2013   Procedure: CERVICAL SIX TO SEVEN ANTERIOR CERVICAL DECOMPRESSION/DISCECTOMY FUSION 1 LEVEL;  Surgeon: Erline Levine, MD;  Location: [facility]S;  Service: Neurosurgery;  Laterality: N/A;  C6-7 Anterior cervical decompression/diskectomy/fusion  . CHOLECYSTECTOMY    . COLONOSCOPY WITH PROPOFOL N/A 01/14/2016   Procedure: COLONOSCOPY WITH PROPOFOL;  Surgeon: Manya Silvas, MD;  Location[facility] ENDOSCOPY;  Service: Endoscopy;  Laterality: N/A;  . ESOPHAGOGASTRODUODENOSCOPY (EGD) WITH PROPOFOL N/A 01/14/2016   Procedure: ESOPHAGOGASTRODUODENOSCOPY (EGD) WITH PROPOFOL;  Surgeon: Manya Silvas, MD;  Location: [facility] ENDOSCOPY;  Service: Endoscopy;  Laterality: N/A;  . EYE SURGERY     benign tumor of eye    Her family history includes Asthma in her maternal grandfather; COPD in her mother;  Colon cancer in her father; Heart disease in her mother; Hypertension in her brother and mother.     Outpatient Encounter Prescriptions as of 12/19/2016  Medication Sig Note  . albuterol (PROAIR HFA) 108 (90 Base) MCG/ACT inhaler Inhale 1 puff into the lungs every 4 (four) hours as needed for wheezing or shortness of breath.   .  Cinnamon 500 MG capsule Take 500 mg by mouth daily.   . diazepam (VALIUM) 2 MG tablet TAKE ONE TABLET BY MOUTH 3 TIMES DAILY AS NEEDED FOR ANXIETY   . Docusate Calcium (STOOL SOFTENER PO) Take by mouth.   . hydrALAZINE (APRESOLINE) 50 MG tablet Take 1 tablet (50 mg total) by mouth 3 (three) times daily.   Marland Kitchen loratadine (CLARITIN) 10 MG tablet Take 1 tablet (10 mg total) by mouth daily.   . meclizine (ANTIVERT) 25 MG tablet Take 1 tablet (25 mg total) by mouth daily as needed for dizziness.   . metoprolol succinate (TOPROL-XL) 25 MG 24 hr tablet TAKE ONE TABLET EVERY DAY   . Multiple Vitamin (MULTIVITAMIN) tablet Take 1 tablet by mouth daily.   . pantoprazole (PROTONIX) 40 MG tablet Take 1 tablet (40 mg total) by mouth 2 (two) times daily.   Marland Kitchen PARoxetine (PAXIL) 20 MG tablet Take 1 tablet (20 mg total) by mouth daily.   . Red Yeast Rice Extract (RED YEAST RICE PO) Take by mouth.   . triamterene-hydrochlorothiazide (MAXZIDE-25) 37.5-25 MG tablet Take 1 tablet by mouth daily.   . verapamil (CALAN-SR) 180 MG CR tablet Take 1 tablet (180 mg total) by mouth 2 (two) times daily.   . [DISCONTINUED] benzonatate (TESSALON PERLES) 100 MG capsule Take 1 capsule (100 mg total) by mouth 3 (three) times daily as needed for cough.   Marland Kitchen OVER THE COUNTER MEDICATION Clear lung   . [DISCONTINUED] amoxicillin (AMOXIL) 500 MG capsule Take 1 capsule (500 mg total) by mouth 3 (three) times daily.   . [DISCONTINUED] fluconazole (DIFLUCAN) 150 MG tablet Take by mouth. Reported on 03/10/2016 02/28/2016: Takes as needed   No facility-administered encounter medications on file as of 12/19/2016.     Allergies  Allergen Reactions  . Ciprofloxacin     Vomiting   . Codeine Sulfate Nausea And Vomiting  . Keflex [Cephalexin] Nausea And Vomiting  . Macrobid [Nitrofurantoin Macrocrystal] Nausea And Vomiting  . Sulfa Antibiotics Nausea And Vomiting    Patient Care Team: Jerrol Banana., MD as PCP - General (Family  Medicine)   Objective:   Vitals:  Vitals:   12/19/16 0919  BP: (!) 146/76  Pulse: 64  Resp: 16  Temp: 98.6 F (37 C)  TempSrc: Oral  Weight: 167 lb (75.8 kg)  Height: 5\' 7"  (1.702 m)    Physical Exam  Constitutional: She is oriented to person, place, and time. She appears well-developed and well-nourished.  HENT:  Head: Normocephalic and atraumatic.  Right Ear: External ear normal.  Left Ear: External ear normal.  Nose: Nose normal.  Mouth/Throat: Oropharynx is clear and moist.  Eyes: Conjunctivae and EOM are normal. Pupils are equal, round, and reactive to light.  Neck: Normal range of motion. Neck supple.  Cardiovascular: Normal rate, regular rhythm, normal heart sounds and intact distal pulses.   Pulmonary/Chest: Effort normal and breath sounds normal.  Abdominal: Soft. Bowel sounds are normal.  Musculoskeletal: Normal range of motion.  Neurological: She is alert and oriented to person, place, and time.  Skin: Skin is warm and dry.  Psychiatric: She has  a normal mood and affect. Her behavior is normal. Judgment and thought content normal.   Current Exercise Habits: Home exercise routine, Type of exercise: walking, Time (Minutes): 30, Frequency (Times/Week): 4, Weekly Exercise (Minutes/Week): 120    Activities of Daily Living In your present state of health, do you have any difficulty performing the following activities: 12/19/2016  Hearing? N  Vision? N  Difficulty concentrating or making decisions? N  Walking or climbing stairs? N  Dressing or bathing? N  Doing errands, shopping? N  Some recent data might be hidden    Fall Risk Assessment Fall Risk  12/19/2016 12/07/2015 06/15/2015  Falls in the past year? No No No     Depression Screen PHQ 2/9 Scores 12/19/2016 12/07/2015 06/15/2015  PHQ - 2 Score 0 0 0  PHQ- 9 Score 2 - -    Cognitive Testing - 6-CIT    Year: 0 4 points  Month: 0 3 points  Memorize "Pia Mau, [address], [place]"  Time (within 1  hour:) 0 3 points  Count backwards from 20: 0 2 4 points  Name months of year: 0 2 4 points  Repeat Address: 0 2 4 6 8 10  points   Total Score:4/28  Interpretation : Normal (0-7) Abnormal (8-28)    Assessment & Plan:     Annual Wellness Visit  Reviewed patient's Family Medical History Reviewed and updated list of patient's medical providers Assessment of cognitive impairment was done Assessed patient's functional ability Established a written schedule for health screening [place] Completed and Reviewed  Exercise Activities and Dietary recommendations Goals    None      Immunization History  Administered Date(s) Administered  . Influenza Whole 07/09/2012  . Influenza, High Dose Seasonal PF 07/11/2016  . Influenza-Unspecified 09/10/2015  . Pneumococcal Conjugate-13 12/07/2015  . Pneumococcal Polysaccharide-23 11/23/2011  . Zoster 08/06/2013    Health Maintenance  Topic Date Due  . Hepatitis C Screening  1946-09-02  . TETANUS/TDAP  06/30/1963  . DEXA SCAN  06/29/2009  . MAMMOGRAM  01/30/2018  . COLONOSCOPY  01/13/2026  . INFLUENZA VACCINE  Completed  . PNA vac Low Risk Adult  Completed    1. Insomnia, unspecified type  - Melatonin 10 MG TABS; Take 1 tablet by mouth at bedtime as needed.  Dispense: 30 tablet; Refill: 12  2. Hordeolum externum of left eye, unspecified eyelid  - ciprofloxacin (CILOXAN) 0.3 % ophthalmic solution; Place 1 drop into the left eye every 4 (four) hours while awake. Administer 1 drop, every 2 hours, while awake, for 2 days. Then 1 drop, every 4 hours, while awake, for the next 5 days.  Dispense: 5 mL; Refill: 0  3. Cheilitis  - Lysine HCl 1000 MG TABS; Take 1 tablet (1,000 mg total) by mouth daily at 2 PM.  Dispense: 30 each; Refill: 12  4. Hyperglycemia - POCT glycosylated hemoglobin (Hb A1C)  5. Dizziness  - meclizine (ANTIVERT) 25 MG tablet; Take 1 tablet (25 mg total) by mouth daily as needed for  dizziness.  Dispense: 90 tablet; Refill: 3  6. Cough  - benzonatate (TESSALON) 100 MG capsule; Take 1 capsule (100 mg total) by mouth 2 (two) times daily as needed for cough.  Dispense: 20 capsule; Refill: 0  7. Dislocation of temporomandibular joint, sequela  - diazepam (VALIUM) 2 MG tablet; Take 1 tablet (2 mg total) by mouth every 12 (twelve) hours as needed for anxiety.  Dispense: 60 tablet; Refill: 0  HPI, Exam  and A&P Transcribed under the direction and in the presence of Wilhemena Durie., MD. Electronically Signed: Althea Charon, RMA    Discussed health benefits of physical activity, and encouraged her to engage in regular exercise appropriate for her age and condition.  I have done the exam and reviewed the chart and it is accurate to the best of my knowledge. Development worker, community has been used and  any errors in dictation or transcription are unintentional. Miguel Aschoff M.D. [facility]

## 2017-01-11 DIAGNOSIS — R079 Chest pain, unspecified: Secondary | ICD-10-CM | POA: Diagnosis not present

## 2017-01-11 DIAGNOSIS — I6529 Occlusion and stenosis of unspecified carotid artery: Secondary | ICD-10-CM | POA: Diagnosis not present

## 2017-01-11 DIAGNOSIS — R5381 Other malaise: Secondary | ICD-10-CM | POA: Diagnosis not present

## 2017-01-11 DIAGNOSIS — R0602 Shortness of breath: Secondary | ICD-10-CM | POA: Diagnosis not present

## 2017-01-11 DIAGNOSIS — E7800 Pure hypercholesterolemia, unspecified: Secondary | ICD-10-CM | POA: Diagnosis not present

## 2017-01-11 DIAGNOSIS — I10 Essential (primary) hypertension: Secondary | ICD-10-CM | POA: Diagnosis not present

## 2017-01-22 ENCOUNTER — Telehealth: Payer: Self-pay | Admitting: Family Medicine

## 2017-01-22 ENCOUNTER — Telehealth: Payer: Self-pay | Admitting: Emergency Medicine

## 2017-01-22 NOTE — Telephone Encounter (Signed)
Pt calling stating that she is having a flare of her diverticulitis. She is having pelvic pain, lower back pain. She says that we have called in Doxycycline in the past for her for this. She knows that she has eaten too many tomatoes and that has caused it to flare. Denies any urinary symptoms. She reports that the pain does not get worse when she eats food. I informed pt that you do not normally refill antibiotics without office visit but I would send a message back. Please advise. Thanks.

## 2017-01-22 NOTE — Telephone Encounter (Signed)
Error/MW °

## 2017-01-23 ENCOUNTER — Ambulatory Visit (INDEPENDENT_AMBULATORY_CARE_PROVIDER_SITE_OTHER): Payer: PPO | Admitting: Family Medicine

## 2017-01-23 VITALS — BP 112/48 | HR 76 | Temp 98.2°F | Resp 16 | Wt 165.0 lb

## 2017-01-23 DIAGNOSIS — K5792 Diverticulitis of intestine, part unspecified, without perforation or abscess without bleeding: Secondary | ICD-10-CM | POA: Diagnosis not present

## 2017-01-23 DIAGNOSIS — I10 Essential (primary) hypertension: Secondary | ICD-10-CM | POA: Diagnosis not present

## 2017-01-23 DIAGNOSIS — R1032 Left lower quadrant pain: Secondary | ICD-10-CM | POA: Diagnosis not present

## 2017-01-23 LAB — POCT URINALYSIS DIPSTICK
Bilirubin, UA: NEGATIVE
Blood, UA: NEGATIVE
Glucose, UA: NEGATIVE
Ketones, UA: NEGATIVE
Leukocytes, UA: NEGATIVE
Nitrite, UA: NEGATIVE
Protein, UA: NEGATIVE
Spec Grav, UA: 1.02 (ref 1.010–1.025)
Urobilinogen, UA: 0.2 E.U./dL
pH, UA: 6 (ref 5.0–8.0)

## 2017-01-23 MED ORDER — DOXYCYCLINE HYCLATE 100 MG PO TABS: 100.0000 mg | ORAL_TABLET | Freq: Two times a day (BID) | ORAL | 1 refills | Status: DC

## 2017-01-23 MED ORDER — DOXYCYCLINE HYCLATE 100 MG PO TABS: 100.0000 mg | ORAL_TABLET | Freq: Two times a day (BID) | ORAL | 0 refills | Status: DC

## 2017-01-23 NOTE — Patient Instructions (Signed)
Do not take Verapamil until you are finished with Doxy.

## 2017-01-23 NOTE — Telephone Encounter (Signed)
Please schedule appointment.

## 2017-01-23 NOTE — Telephone Encounter (Signed)
You are correct. I do not have openings today. Adriana or Bob,Dennis?

## 2017-01-23 NOTE — Telephone Encounter (Signed)
I called and scheduled pt an appointment with Dr Rosanna Randy for today at 1:45 (the pt at the 1:45 appointment time canceled so there was an open appointment)/MW

## 2017-01-23 NOTE — Progress Notes (Signed)
Jessica Armstrong  MRN: [id number] DOB: 21-Mar-1946  Subjective:  HPI  Patient is here to discuss abdominal pain. Patient states Sunday 01/21/17 she had some chili that day and then had some nausea that day. Then yesterday 01/22/17 afternoon developed severe sharp pain that started in the right lower quadrant and radiates to the left lower quadrant, also pain present in the lower back-bilateral sides. She was very uncomfortable last night due to the pain and had hard time sleeping due to this. She thinks she may have had food before the pain developed. She has had some sweats and chills. Symptoms do not get better with anything particular that she knows of. No fever. No urinary symptoms.  Patient Active Problem List   Diagnosis Date Noted  . Dizziness 06/30/2016  . Carotid artery stenosis   . Chronic diastolic CHF (congestive heart failure) ([place])   . Fatigue 04/19/2015  . Anxiety 04/19/2015  . Allergic rhinitis 04/19/2015  . Arthritis 04/19/2015  . Disorder of carbohydrate transport and metabolism ([place]) 04/19/2015  . Diverticulitis 04/19/2015  . Acid reflux 04/19/2015  . Hormone replacement therapy (postmenopausal) 04/19/2015  . Hypercholesteremia 04/19/2015  . Blood glucose elevated 04/19/2015  . Neuropathy 04/19/2015  . Pericarditis 04/19/2015  . Common wart 04/19/2015  . Hot flashes 06/10/2013  . Hemoptysis 04/21/2013  . Chest pain 04/08/2013  . SOB (shortness of breath) 04/08/2013  . Cough 04/08/2013  . Murmur 04/08/2013  . Essential hypertension 04/08/2013    Past Medical History:  Diagnosis Date  . Anemia    mild; no medical treatment  . Arthritis   . Carotid artery stenosis    a. doppler 12/2200: RICA 54-27%, LICA 0-62% f/u 1 year  . Cervical spondyloarthritis   . Chronic diastolic CHF (congestive heart failure) ([place])    a. echo 04/2015: EF 55-60%, no RWMA, mitral valve w/ systolic bowing w/o prolapse, PASP 53 mmHg  . Claudication ([place])   . Colon polyp   . Cystitis     . Depression    situational  . GERD (gastroesophageal reflux disease)   . H/O vertigo   . History of measles   . History of mumps   . Hypercholesterolemia   . Hyperglycemia   . Hypertension   . Need for SBE (subacute bacterial endocarditis) prophylaxis   . Neuropathy ([place])   . Palpitations   . PONV (postoperative nausea and vomiting)    severe n/v after anesthesia  . Post-menopausal   . Pulmonary valve dysplasia   . Seasonal allergies   . Tricuspid regurgitation    a. echo 04/2013: EF 55-60%, nl wall motion, mild to mod TR, PASP 35 mm Hg    Social History   Social History  . Marital status: Married    Spouse name: N/A  . Number of children: N/A  . Years of education: N/A   Occupational History  . Not on file.   Social History Main Topics  . Smoking status: Never Smoker  . 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 Alcohol use No     Comment: rare use 1-2 drinks per year.   . Drug use: No  . Sexual activity: Not on file   Other Topics Concern  . Not on file   Social History Narrative  . No narrative on file    Outpatient Encounter Prescriptions as of 01/23/2017  Medication Sig  . albuterol (PROAIR HFA) 108 (90 Base) MCG/ACT  inhaler Inhale 1 puff into the lungs every 4 (four) hours as needed for wheezing or shortness of breath.  . Cinnamon 500 MG capsule Take 500 mg by mouth daily.  . diazepam (VALIUM) 2 MG tablet Take 1 tablet (2 mg total) by mouth every 12 (twelve) hours as needed for anxiety.  Mariane Baumgarten Calcium (STOOL SOFTENER PO) Take by mouth.  . hydrALAZINE (APRESOLINE) 50 MG tablet Take 1 tablet (50 mg total) by mouth 3 (three) times daily.  Marland Kitchen loratadine (CLARITIN) 10 MG tablet Take 1 tablet (10 mg total) by mouth daily.  . meclizine (ANTIVERT) 25 MG tablet Take 1 tablet (25 mg total) by mouth daily as needed for dizziness.  . metoprolol succinate (TOPROL-XL) 25 MG 24 hr tablet TAKE ONE TABLET EVERY  DAY  . Multiple Vitamin (MULTIVITAMIN) tablet Take 1 tablet by mouth daily.  Marland Kitchen OVER THE COUNTER MEDICATION Clear lung  . pantoprazole (PROTONIX) 40 MG tablet TAKE ONE TABLET BY MOUTH TWICE DAILY  . PARoxetine (PAXIL) 20 MG tablet Take 1 tablet (20 mg total) by mouth daily.  . Red Yeast Rice Extract (RED YEAST RICE PO) Take by mouth.  . triamterene-hydrochlorothiazide (MAXZIDE-25) 37.5-25 MG tablet Take 1 tablet by mouth daily.  . verapamil (CALAN-SR) 180 MG CR tablet Take 1 tablet (180 mg total) by mouth 2 (two) times daily.  . [DISCONTINUED] benzonatate (TESSALON) 100 MG capsule Take 1 capsule (100 mg total) by mouth 2 (two) times daily as needed for cough.  . [DISCONTINUED] ciprofloxacin (CILOXAN) 0.3 % ophthalmic solution Place 1 drop into the left eye every 4 (four) hours while awake. Administer 1 drop, every 2 hours, while awake, for 2 days. Then 1 drop, every 4 hours, while awake, for the next 5 days.  . [DISCONTINUED] Lysine HCl 1000 MG TABS Take 1 tablet (1,000 mg total) by mouth daily at 2 PM.  . [DISCONTINUED] Melatonin 10 MG TABS Take 1 tablet by mouth at bedtime as needed.  . [DISCONTINUED] triamterene-hydrochlorothiazide (DYAZIDE) 37.5-25 MG capsule TAKE 1 CAPSULE EVERY DAY   No facility-administered encounter medications on file as of 01/23/2017.     Allergies  Allergen Reactions  . Ciprofloxacin     Vomiting   . Codeine Sulfate Nausea And Vomiting  . Keflex [Cephalexin] Nausea And Vomiting  . Macrobid [Nitrofurantoin Macrocrystal] Nausea And Vomiting  . Sulfa Antibiotics Nausea And Vomiting    Review of Systems  Constitutional: Negative.   Respiratory: Negative.   Cardiovascular: Negative.   Gastrointestinal: Positive for abdominal pain, diarrhea (chronic) and nausea.  Genitourinary: Negative.   Musculoskeletal: Negative.   Neurological:       Slightly unsteady    Objective:  BP (!) 112/48   Pulse 76   Temp 98.2 F (36.8 C)   Resp 16   Wt 165 lb (74.8 kg)    BMI 25.84 kg/m   Physical Exam  Constitutional: She is well-developed, well-nourished, and in no distress.  HENT:  Head: Normocephalic and atraumatic.  Eyes: Conjunctivae are normal. Pupils are equal, round, and reactive to light.  Neck: Normal range of motion. Neck supple.  Cardiovascular: Normal rate, regular rhythm and intact distal pulses.   Murmur heard.  Systolic murmur is present with a grade of 2/6  Pulmonary/Chest: Effort normal and breath sounds normal. No respiratory distress. She has no wheezes.  Abdominal: There is tenderness in the right lower quadrant, suprapubic area and left lower quadrant. There is CVA tenderness (bilateral).   Assessment and Plan :  1. Diverticulitis  of intestine without perforation or abscess without bleeding, unspecified part of intestinal tract Will treat with Doxy as a diverticulitis. Advised patient is right side pain increases or does not improve will need to re access. - CBC w/Diff/Platelet - Comprehensive metabolic panel  2. Essential hypertension Hypotensive today. Advised patient to stop Verapamil until she finishes Doxy. Follow. - CBC w/Diff/Platelet - Comprehensive metabolic panel  3. Left lower quadrant pain UA is normal today.  HPI, Exam and A&P transcribed by Theressa Millard, RMA under direction and in the presence of Miguel Aschoff, MD.

## 2017-01-23 NOTE — Telephone Encounter (Signed)
Pt calling back about this.

## 2017-01-24 LAB — CBC WITH DIFFERENTIAL/PLATELET
Basophils Absolute: 0 10*3/uL (ref 0.0–0.2)
Basos: 0 %
EOS (ABSOLUTE): 0.1 10*3/uL (ref 0.0–0.4)
Eos: 1 %
Hematocrit: 37.6 % (ref 34.0–46.6)
Hemoglobin: 12.6 g/dL (ref 11.1–15.9)
Immature Grans (Abs): 0 10*3/uL (ref 0.0–0.1)
Immature Granulocytes: 0 %
Lymphocytes Absolute: 2.9 10*3/uL (ref 0.7–3.1)
Lymphs: 28 %
MCH: 30.5 pg (ref 26.6–33.0)
MCHC: 33.5 g/dL (ref 31.5–35.7)
MCV: 91 fL (ref 79–97)
Monocytes Absolute: 1.2 10*3/uL — ABNORMAL HIGH (ref 0.1–0.9)
Monocytes: 12 %
Neutrophils Absolute: 6.3 10*3/uL (ref 1.4–7.0)
Neutrophils: 59 %
Platelets: 318 10*3/uL (ref 150–379)
RBC: 4.13 x10E6/uL (ref 3.77–5.28)
RDW: 14 % (ref 12.3–15.4)
WBC: 10.6 10*3/uL (ref 3.4–10.8)

## 2017-01-24 LAB — COMPREHENSIVE METABOLIC PANEL
ALT: 17 IU/L (ref 0–32)
AST: 21 IU/L (ref 0–40)
Albumin/Globulin Ratio: 1.6 (ref 1.2–2.2)
Albumin: 4.7 g/dL (ref 3.5–4.8)
Alkaline Phosphatase: 88 IU/L (ref 39–117)
BUN/Creatinine Ratio: 16 (ref 12–28)
BUN: 22 mg/dL (ref 8–27)
Bilirubin Total: 0.4 mg/dL (ref 0.0–1.2)
CO2: 22 mmol/L (ref 18–29)
Calcium: 10.2 mg/dL (ref 8.7–10.3)
Chloride: 101 mmol/L (ref 96–106)
Creatinine, Ser: 1.38 mg/dL — ABNORMAL HIGH (ref 0.57–1.00)
GFR calc Af Amer: 44 mL/min/{1.73_m2} — ABNORMAL LOW (ref >59)
GFR calc non Af Amer: 38 mL/min/{1.73_m2} — ABNORMAL LOW (ref >59)
Globulin, Total: 3 g/dL (ref 1.5–4.5)
Glucose: 134 mg/dL — ABNORMAL HIGH (ref 65–99)
Potassium: 5.1 mmol/L (ref 3.5–5.2)
Sodium: 142 mmol/L (ref 134–144)
Total Protein: 7.7 g/dL (ref 6.0–8.5)

## 2017-01-24 NOTE — Progress Notes (Signed)
Advised  ED 

## 2017-01-31 ENCOUNTER — Other Ambulatory Visit: Payer: Self-pay | Admitting: Family Medicine

## 2017-01-31 DIAGNOSIS — Z1231 Encounter for screening mammogram for malignant neoplasm of breast: Secondary | ICD-10-CM

## 2017-02-23 ENCOUNTER — Ambulatory Visit
Admission: RE | Admit: 2017-02-23 | Discharge: 2017-02-23 | Disposition: A | Discharge status: Home / Self Care | Payer: PPO | Source: Ambulatory Visit | Attending: Family Medicine | Admitting: Family Medicine

## 2017-02-23 DIAGNOSIS — Z1231 Encounter for screening mammogram for malignant neoplasm of breast: Secondary | ICD-10-CM | POA: Diagnosis not present

## 2017-03-28 DIAGNOSIS — M25511 Pain in right shoulder: Secondary | ICD-10-CM | POA: Diagnosis not present

## 2017-03-28 DIAGNOSIS — M5412 Radiculopathy, cervical region: Secondary | ICD-10-CM | POA: Diagnosis not present

## 2017-03-30 ENCOUNTER — Other Ambulatory Visit: Payer: Self-pay | Admitting: Neurosurgery

## 2017-03-30 DIAGNOSIS — M5412 Radiculopathy, cervical region: Secondary | ICD-10-CM

## 2017-03-30 DIAGNOSIS — M25511 Pain in right shoulder: Secondary | ICD-10-CM

## 2017-04-04 ENCOUNTER — Ambulatory Visit
Admission: RE | Admit: 2017-04-04 | Discharge: 2017-04-04 | Disposition: A | Discharge status: Home / Self Care | Payer: PPO | Source: Ambulatory Visit | Attending: Neurosurgery | Admitting: Neurosurgery

## 2017-04-04 DIAGNOSIS — M5412 Radiculopathy, cervical region: Secondary | ICD-10-CM | POA: Diagnosis not present

## 2017-04-04 DIAGNOSIS — M25511 Pain in right shoulder: Secondary | ICD-10-CM | POA: Insufficient documentation

## 2017-04-04 DIAGNOSIS — M7551 Bursitis of right shoulder: Secondary | ICD-10-CM | POA: Insufficient documentation

## 2017-04-04 DIAGNOSIS — Z981 Arthrodesis status: Secondary | ICD-10-CM | POA: Diagnosis not present

## 2017-04-04 DIAGNOSIS — M542 Cervicalgia: Secondary | ICD-10-CM | POA: Diagnosis not present

## 2017-04-09 ENCOUNTER — Other Ambulatory Visit: Payer: Self-pay | Admitting: Family Medicine

## 2017-04-16 ENCOUNTER — Ambulatory Visit: Payer: PPO

## 2017-04-16 ENCOUNTER — Other Ambulatory Visit: Payer: PPO

## 2017-04-18 DIAGNOSIS — M75111 Incomplete rotator cuff tear or rupture of right shoulder, not specified as traumatic: Secondary | ICD-10-CM | POA: Diagnosis not present

## 2017-04-18 DIAGNOSIS — M24811 Other specific joint derangements of right shoulder, not elsewhere classified: Secondary | ICD-10-CM | POA: Diagnosis not present

## 2017-05-09 DIAGNOSIS — M75111 Incomplete rotator cuff tear or rupture of right shoulder, not specified as traumatic: Secondary | ICD-10-CM | POA: Diagnosis not present

## 2017-05-16 ENCOUNTER — Other Ambulatory Visit: Payer: Self-pay | Admitting: Orthopedic Surgery

## 2017-05-19 NOTE — Pre-Procedure Instructions (Signed)
Jessica Armstrong [facility]  05/19/2017      [facility] - [place], [place] - [address] [place] [address] Phone: [phone] Fax: [phone]    Your procedure is scheduled on May 24, 2017.  Report to [facility] Admitting at 8:15 A.M.  Call this number if you have problems the morning of surgery:  [phone]   Remember:  Do not eat food or drink liquids after midnight.  Take these medicines the morning of surgery with A SIP OF WATER Hydralazine (Apresoline), Metoprolol (Toprol-XL), Diazepam (Valium) if needed, Albuterol inhaler if needed-bring with you, Pantoprazole (Protonix), Paroxetine (Paxil).  7 days prior to surgery STOP taking any Aspirin, Aleve, Naproxen, Ibuprofen, Motrin, Advil, Goody's, BC's, all herbal medications, fish oil, and all vitamins   Do not wear jewelry, make-up or nail polish.  Do not wear lotions, powders, or perfumes, or deoderant.  Do not shave 48 hours prior to surgery.    Do not bring valuables to the hospital.   [facility] is not responsible for any belongings or valuables.  Contacts, dentures or bridgework may not be worn into surgery.  Leave your suitcase in the car.  After surgery it may be brought to your room.  For patients admitted to the hospital, discharge time will be determined by your treatment team.  Patients discharged the day of surgery will not be allowed to drive home.   Special instructions:   [facility]- Preparing For Surgery  Before surgery, you can play an important role. Because skin is not sterile, your skin needs to be as free of germs as possible. You can reduce the number of germs on your skin by washing with CHG (chlorahexidine gluconate) Soap before surgery.  CHG is an antiseptic cleaner which kills germs and bonds with the skin to continue killing germs even after washing.  Please do not use if you have an allergy to CHG or antibacterial soaps. If your skin becomes  reddened/irritated stop using the CHG.  Do not shave (including legs and underarms) for at least 48 hours prior to first CHG shower. It is OK to shave your face.  Please follow these instructions carefully.   1. Shower the NIGHT BEFORE SURGERY and the MORNING OF SURGERY with CHG.   2. If you chose to wash your hair, wash your hair first as usual with your normal shampoo.  3. After you shampoo, rinse your hair and body thoroughly to remove the shampoo.  4. Use CHG as you would any other liquid soap. You can apply CHG directly to the skin and wash gently with a scrungie or a clean washcloth.   5. Apply the CHG Soap to your body ONLY FROM THE NECK DOWN.  Do not use on open wounds or open sores. Avoid contact with your eyes, ears, mouth and genitals (private parts). Wash genitals (private parts) with your normal soap.  6. Wash thoroughly, paying special attention to the area where your surgery will be performed.  7. Thoroughly rinse your body with warm water from the neck down.  8. DO NOT shower/wash with your normal soap after using and rinsing off the CHG Soap.  9. Pat yourself dry with a CLEAN TOWEL.   10. Wear CLEAN PAJAMAS   11. Place CLEAN SHEETS on your bed the night of your first shower and DO NOT SLEEP WITH PETS.   Day of Surgery: Do not apply any deodorants/lotions. Please wear clean clothes to  the hospital/surgery center.      Please read over the following fact sheets that you were given. Pain Booklet, Coughing and Deep Breathing and Surgical Site Infection Prevention

## 2017-05-21 ENCOUNTER — Encounter (HOSPITAL_COMMUNITY)
Admission: RE | Admit: 2017-05-21 | Discharge: 2017-05-21 | Disposition: A | Discharge status: Home / Self Care | Payer: PPO | Source: Ambulatory Visit | Attending: Orthopedic Surgery | Admitting: Orthopedic Surgery

## 2017-05-21 ENCOUNTER — Encounter (HOSPITAL_COMMUNITY): Payer: Self-pay

## 2017-05-21 DIAGNOSIS — M75111 Incomplete rotator cuff tear or rupture of right shoulder, not specified as traumatic: Secondary | ICD-10-CM | POA: Insufficient documentation

## 2017-05-21 DIAGNOSIS — I10 Essential (primary) hypertension: Secondary | ICD-10-CM | POA: Insufficient documentation

## 2017-05-21 DIAGNOSIS — Z01818 Encounter for other preprocedural examination: Secondary | ICD-10-CM | POA: Diagnosis not present

## 2017-05-21 DIAGNOSIS — R001 Bradycardia, unspecified: Secondary | ICD-10-CM | POA: Insufficient documentation

## 2017-05-21 DIAGNOSIS — M19011 Primary osteoarthritis, right shoulder: Secondary | ICD-10-CM | POA: Insufficient documentation

## 2017-05-21 HISTORY — DX: Dislocation of jaw, unspecified side, initial encounter: S03.00XA

## 2017-05-21 HISTORY — DX: Dyspnea, unspecified: R06.00

## 2017-05-21 LAB — BASIC METABOLIC PANEL
Anion gap: 10 (ref 5–15)
BUN: 22 mg/dL — ABNORMAL HIGH (ref 6–20)
CO2: 21 mmol/L — ABNORMAL LOW (ref 22–32)
Calcium: 9.6 mg/dL (ref 8.9–10.3)
Chloride: 108 mmol/L (ref 101–111)
Creatinine, Ser: 1.12 mg/dL — ABNORMAL HIGH (ref 0.44–1.00)
GFR calc Af Amer: 55 mL/min — ABNORMAL LOW (ref >60)
GFR calc non Af Amer: 48 mL/min — ABNORMAL LOW (ref >60)
Glucose, Bld: 124 mg/dL — ABNORMAL HIGH (ref 65–99)
Potassium: 4.5 mmol/L (ref 3.5–5.1)
Sodium: 139 mmol/L (ref 135–145)

## 2017-05-21 LAB — CBC
HCT: 35.6 % — ABNORMAL LOW (ref 36.0–46.0)
Hemoglobin: 12.3 g/dL (ref 12.0–15.0)
MCH: 30.9 pg (ref 26.0–34.0)
MCHC: 34.6 g/dL (ref 30.0–36.0)
MCV: 89.4 fL (ref 78.0–100.0)
Platelets: 272 10*3/uL (ref 150–400)
RBC: 3.98 MIL/uL (ref 3.87–5.11)
RDW: 12.2 % (ref 11.5–15.5)
WBC: 6.8 10*3/uL (ref 4.0–10.5)

## 2017-05-22 NOTE — Progress Notes (Signed)
Anesthesia Chart Review:  Pt is a 71 year old female scheduled for R shoulder arthroscopy with rotator cuff repair, subacromial decompression and distal clavicle excision and 05/24/2017 with Tania Ade, M.D.  - PCP is Wilhemena Durie, M.D. - Cardiologist is Isaias Cowman, MD who cleared pt for surgery at low risk (notes in care everywhere)  PMH includes: CHF, HTN, palpitations, hyperlipidemia carotid artery stenosis, post-op N/V, GERD. Never smoker. BMI 25.5. S/p ACDF 08/26/13.   Medications include: Albuterol, hydralazine, metoprolol, Protonix, Dyazide, verapamil  Preoperative labs reviewed.    EKG 05/21/17: Sinus bradycardia (57 bpm). Septal infarct, age undetermined  Echo 07/07/16:  1. Normal LV systolic function with mild LVH. EF >55%. 2. Normal RV systolic function. 3. Mild MR, mild TR, mild PR. 4. No valvular stenosis. 5. Mild to moderate PI  Nuclear stress test 05/03/15:   There was no ST segment deviation noted during stress.  No T wave inversion was noted during stress.  The study is normal.  This is a low risk study.  The left ventricular ejection fraction is hyperdynamic (>65%).  If no changes, I anticipate pt can proceed with surgery as scheduled.   Willeen Cass, FNP-BC [facility]/Anesthesiology Phone: [phone] 05/22/2017 1:27 PM

## 2017-05-24 ENCOUNTER — Ambulatory Visit (HOSPITAL_COMMUNITY)
Admission: RE | Admit: 2017-05-24 | Discharge: 2017-05-24 | Disposition: A | Discharge status: Home / Self Care | Payer: PPO | Source: Ambulatory Visit | Attending: Orthopedic Surgery | Admitting: Orthopedic Surgery

## 2017-05-29 MED ORDER — IOPAMIDOL (ISOVUE-300) INJECTION 61%
INTRAVENOUS | Status: AC
  Filled 2017-05-29: qty 100

## 2017-05-29 MED ORDER — IOPAMIDOL (ISOVUE-300) INJECTION 61%
INTRAVENOUS | Status: AC
  Filled 2017-05-29: qty 150

## 2017-05-30 MED ORDER — VANCOMYCIN HCL IN DEXTROSE 1-5 GM/200ML-% IV SOLN
1000.0000 mg | INTRAVENOUS | Status: AC
  Administered 2017-05-31: 1000 mg via INTRAVENOUS
  Filled 2017-05-30: qty 200

## 2017-05-31 ENCOUNTER — Ambulatory Visit (HOSPITAL_COMMUNITY): Payer: PPO | Admitting: Certified Registered Nurse Anesthetist

## 2017-05-31 ENCOUNTER — Encounter (HOSPITAL_COMMUNITY): Payer: Self-pay

## 2017-05-31 ENCOUNTER — Encounter (HOSPITAL_COMMUNITY)
Admission: RE | Disposition: A | Discharge status: Home / Self Care | Payer: Self-pay | Source: Ambulatory Visit | Attending: Orthopedic Surgery

## 2017-05-31 ENCOUNTER — Ambulatory Visit (HOSPITAL_COMMUNITY): Payer: PPO | Admitting: Emergency Medicine

## 2017-05-31 ENCOUNTER — Ambulatory Visit (HOSPITAL_COMMUNITY)
Admission: RE | Admit: 2017-05-31 | Discharge: 2017-05-31 | Disposition: A | Discharge status: Home / Self Care | Payer: PPO | Source: Ambulatory Visit | Attending: Orthopedic Surgery | Admitting: Orthopedic Surgery

## 2017-05-31 DIAGNOSIS — F329 Major depressive disorder, single episode, unspecified: Secondary | ICD-10-CM | POA: Insufficient documentation

## 2017-05-31 DIAGNOSIS — M75111 Incomplete rotator cuff tear or rupture of right shoulder, not specified as traumatic: Secondary | ICD-10-CM | POA: Diagnosis not present

## 2017-05-31 DIAGNOSIS — F419 Anxiety disorder, unspecified: Secondary | ICD-10-CM | POA: Diagnosis not present

## 2017-05-31 DIAGNOSIS — Y9289 Other specified places as the place of occurrence of the external cause: Secondary | ICD-10-CM | POA: Insufficient documentation

## 2017-05-31 DIAGNOSIS — I5032 Chronic diastolic (congestive) heart failure: Secondary | ICD-10-CM | POA: Diagnosis not present

## 2017-05-31 DIAGNOSIS — M19011 Primary osteoarthritis, right shoulder: Secondary | ICD-10-CM | POA: Diagnosis not present

## 2017-05-31 DIAGNOSIS — Z888 Allergy status to other drugs, medicaments and biological substances status: Secondary | ICD-10-CM | POA: Insufficient documentation

## 2017-05-31 DIAGNOSIS — S43431A Superior glenoid labrum lesion of right shoulder, initial encounter: Secondary | ICD-10-CM | POA: Diagnosis not present

## 2017-05-31 DIAGNOSIS — X58XXXA Exposure to other specified factors, initial encounter: Secondary | ICD-10-CM | POA: Insufficient documentation

## 2017-05-31 DIAGNOSIS — I110 Hypertensive heart disease with heart failure: Secondary | ICD-10-CM | POA: Insufficient documentation

## 2017-05-31 DIAGNOSIS — I739 Peripheral vascular disease, unspecified: Secondary | ICD-10-CM | POA: Insufficient documentation

## 2017-05-31 DIAGNOSIS — M7541 Impingement syndrome of right shoulder: Secondary | ICD-10-CM | POA: Diagnosis not present

## 2017-05-31 DIAGNOSIS — Z882 Allergy status to sulfonamides status: Secondary | ICD-10-CM | POA: Insufficient documentation

## 2017-05-31 DIAGNOSIS — E7800 Pure hypercholesterolemia, unspecified: Secondary | ICD-10-CM | POA: Diagnosis not present

## 2017-05-31 DIAGNOSIS — G8918 Other acute postprocedural pain: Secondary | ICD-10-CM | POA: Diagnosis not present

## 2017-05-31 DIAGNOSIS — F418 Other specified anxiety disorders: Secondary | ICD-10-CM | POA: Diagnosis not present

## 2017-05-31 DIAGNOSIS — K219 Gastro-esophageal reflux disease without esophagitis: Secondary | ICD-10-CM | POA: Diagnosis not present

## 2017-05-31 DIAGNOSIS — Z79899 Other long term (current) drug therapy: Secondary | ICD-10-CM | POA: Insufficient documentation

## 2017-05-31 HISTORY — PX: SHOULDER ARTHROSCOPY WITH ROTATOR CUFF REPAIR AND SUBACROMIAL DECOMPRESSION: SHX5686

## 2017-05-31 HISTORY — DX: Unspecified rotator cuff tear or rupture of right shoulder, not specified as traumatic: M75.101

## 2017-05-31 SURGERY — SHOULDER ARTHROSCOPY WITH ROTATOR CUFF REPAIR AND SUBACROMIAL DECOMPRESSION
Anesthesia: General | Laterality: Right

## 2017-05-31 MED ORDER — FENTANYL CITRATE (PF) 250 MCG/5ML IJ SOLN
INTRAMUSCULAR | Status: AC
  Filled 2017-05-31: qty 5

## 2017-05-31 MED ORDER — DEXAMETHASONE SODIUM PHOSPHATE 10 MG/ML IJ SOLN
INTRAMUSCULAR | Status: AC
  Filled 2017-05-31: qty 2

## 2017-05-31 MED ORDER — SODIUM CHLORIDE 0.9 % IR SOLN
Status: DC | PRN
  Administered 2017-05-31 (×2): 3000 mL

## 2017-05-31 MED ORDER — PROPOFOL 10 MG/ML IV BOLUS
INTRAVENOUS | Status: DC | PRN
  Administered 2017-05-31: 150 mg via INTRAVENOUS

## 2017-05-31 MED ORDER — POVIDONE-IODINE 7.5 % EX SOLN
Freq: Once | CUTANEOUS | Status: DC
  Filled 2017-05-31: qty 118

## 2017-05-31 MED ORDER — OXYCODONE-ACETAMINOPHEN 5-325 MG PO TABS: 1.0000 | ORAL_TABLET | ORAL | 0 refills | Status: DC | PRN

## 2017-05-31 MED ORDER — FENTANYL CITRATE (PF) 100 MCG/2ML IJ SOLN: 25.0000 ug | INTRAMUSCULAR | Status: DC | PRN

## 2017-05-31 MED ORDER — LIDOCAINE HCL (CARDIAC) 20 MG/ML IV SOLN
INTRAVENOUS | Status: DC | PRN
  Administered 2017-05-31: 40 mg via INTRAVENOUS
  Administered 2017-05-31: 60 mg via INTRATRACHEAL

## 2017-05-31 MED ORDER — ROCURONIUM BROMIDE 100 MG/10ML IV SOLN
INTRAVENOUS | Status: DC | PRN
  Administered 2017-05-31: 50 mg via INTRAVENOUS

## 2017-05-31 MED ORDER — TRANEXAMIC ACID 1000 MG/10ML IV SOLN
1000.0000 mg | INTRAVENOUS | Status: DC
  Filled 2017-05-31: qty 10

## 2017-05-31 MED ORDER — ONDANSETRON HCL 4 MG/2ML IJ SOLN
INTRAMUSCULAR | Status: DC | PRN
  Administered 2017-05-31: 4 mg via INTRAVENOUS

## 2017-05-31 MED ORDER — 0.9 % SODIUM CHLORIDE (POUR BTL) OPTIME
TOPICAL | Status: DC | PRN
  Administered 2017-05-31: 1000 mL

## 2017-05-31 MED ORDER — MIDAZOLAM HCL 2 MG/2ML IJ SOLN
2.0000 mg | Freq: Once | INTRAMUSCULAR | Status: AC
  Administered 2017-05-31: 2 mg via INTRAVENOUS

## 2017-05-31 MED ORDER — SUGAMMADEX SODIUM 200 MG/2ML IV SOLN
INTRAVENOUS | Status: DC | PRN
  Administered 2017-05-31: 200 mg via INTRAVENOUS

## 2017-05-31 MED ORDER — PROPOFOL 10 MG/ML IV BOLUS
INTRAVENOUS | Status: AC
  Filled 2017-05-31: qty 20

## 2017-05-31 MED ORDER — METOCLOPRAMIDE HCL 5 MG/ML IJ SOLN
INTRAMUSCULAR | Status: DC | PRN
  Administered 2017-05-31: 5 mg via INTRAVENOUS

## 2017-05-31 MED ORDER — BUPIVACAINE HCL (PF) 0.75 % IJ SOLN
INTRAMUSCULAR | Status: DC | PRN
  Administered 2017-05-31: 20 mL via PERINEURAL

## 2017-05-31 MED ORDER — ROCURONIUM BROMIDE 10 MG/ML (PF) SYRINGE
PREFILLED_SYRINGE | INTRAVENOUS | Status: AC
  Filled 2017-05-31: qty 5

## 2017-05-31 MED ORDER — DEXAMETHASONE SODIUM PHOSPHATE 10 MG/ML IJ SOLN
INTRAMUSCULAR | Status: DC | PRN
  Administered 2017-05-31: 10 mg via INTRAVENOUS

## 2017-05-31 MED ORDER — MEPERIDINE HCL 25 MG/ML IJ SOLN: 6.2500 mg | INTRAMUSCULAR | Status: DC | PRN

## 2017-05-31 MED ORDER — LIDOCAINE 2% (20 MG/ML) 5 ML SYRINGE
INTRAMUSCULAR | Status: AC
  Filled 2017-05-31: qty 5

## 2017-05-31 MED ORDER — LACTATED RINGERS IV SOLN
INTRAVENOUS | Status: DC
  Administered 2017-05-31: 12:00:00 via INTRAVENOUS

## 2017-05-31 MED ORDER — FENTANYL CITRATE (PF) 100 MCG/2ML IJ SOLN
INTRAMUSCULAR | Status: AC
  Administered 2017-05-31: 100 ug via INTRAVENOUS
  Filled 2017-05-31: qty 2

## 2017-05-31 MED ORDER — DIPHENHYDRAMINE HCL 50 MG/ML IJ SOLN
INTRAMUSCULAR | Status: DC | PRN
  Administered 2017-05-31: 12.5 mg via INTRAVENOUS

## 2017-05-31 MED ORDER — PHENYLEPHRINE HCL 10 MG/ML IJ SOLN
INTRAVENOUS | Status: DC | PRN
  Administered 2017-05-31: 75 ug/min via INTRAVENOUS

## 2017-05-31 MED ORDER — ONDANSETRON HCL 4 MG/2ML IJ SOLN
INTRAMUSCULAR | Status: AC
  Filled 2017-05-31: qty 4

## 2017-05-31 MED ORDER — FENTANYL CITRATE (PF) 100 MCG/2ML IJ SOLN
100.0000 ug | Freq: Once | INTRAMUSCULAR | Status: AC
  Administered 2017-05-31: 100 ug via INTRAVENOUS

## 2017-05-31 MED ORDER — SCOPOLAMINE 1 MG/3DAYS TD PT72
MEDICATED_PATCH | TRANSDERMAL | Status: DC | PRN
  Administered 2017-05-31: 1 via TRANSDERMAL

## 2017-05-31 MED ORDER — MIDAZOLAM HCL 2 MG/2ML IJ SOLN
INTRAMUSCULAR | Status: AC
  Administered 2017-05-31: 2 mg via INTRAVENOUS
  Filled 2017-05-31: qty 2

## 2017-05-31 MED ORDER — SUGAMMADEX SODIUM 200 MG/2ML IV SOLN
INTRAVENOUS | Status: AC
  Filled 2017-05-31: qty 4

## 2017-05-31 SURGICAL SUPPLY — 62 items
BLADE SURG 11 STRL SS (BLADE) ×2 IMPLANT
BUR OVAL 4.0 (BURR) ×2 IMPLANT
CANNULA 5.75X7 CRYSTAL CLEAR (CANNULA) ×2 IMPLANT
CANNULA 5.75X71 LONG (CANNULA) ×2 IMPLANT
CANNULA TWIST IN 8.25X7CM (CANNULA) ×2 IMPLANT
CHLORAPREP W/TINT 26ML (MISCELLANEOUS) ×2 IMPLANT
DRAPE INCISE IOBAN 66X45 STRL (DRAPES) ×2 IMPLANT
DRAPE ORTHO SPLIT 77X108 STRL (DRAPES) ×2
DRAPE STERI 35X30 U-POUCH (DRAPES) ×2 IMPLANT
DRAPE SURG 17X23 STRL (DRAPES) ×2 IMPLANT
DRAPE SURG ORHT 6 SPLT 77X108 (DRAPES) ×2 IMPLANT
DRAPE U-SHAPE 47X51 STRL (DRAPES) ×2 IMPLANT
DRAPE U-SHAPE 76X120 STRL (DRAPES) ×2 IMPLANT
DRSG PAD ABDOMINAL 8X10 ST (GAUZE/BANDAGES/DRESSINGS) ×4 IMPLANT
FLUID NSS /IRRIG 3000 ML XXX (IV SOLUTION) ×4 IMPLANT
GAUZE SPONGE 4X4 12PLY STRL (GAUZE/BANDAGES/DRESSINGS) ×2 IMPLANT
GAUZE SPONGE 4X4 12PLY STRL LF (GAUZE/BANDAGES/DRESSINGS) ×2 IMPLANT
GAUZE XEROFORM 1X8 LF (GAUZE/BANDAGES/DRESSINGS) ×2 IMPLANT
GLOVE BIO SURGEON STRL SZ7 (GLOVE) ×4 IMPLANT
GLOVE BIO SURGEON STRL SZ7.5 (GLOVE) ×2 IMPLANT
GLOVE BIOGEL PI IND STRL 7.0 (GLOVE) ×2 IMPLANT
GLOVE BIOGEL PI IND STRL 8 (GLOVE) ×1 IMPLANT
GLOVE BIOGEL PI INDICATOR 7.0 (GLOVE) ×2
GLOVE BIOGEL PI INDICATOR 8 (GLOVE) ×1
GOWN STRL REUS W/ TWL LRG LVL3 (GOWN DISPOSABLE) ×3 IMPLANT
GOWN STRL REUS W/ TWL XL LVL3 (GOWN DISPOSABLE) ×1 IMPLANT
GOWN STRL REUS W/TWL LRG LVL3 (GOWN DISPOSABLE) ×3
GOWN STRL REUS W/TWL XL LVL3 (GOWN DISPOSABLE) ×1
KIT BASIN OR (CUSTOM PROCEDURE TRAY) ×2 IMPLANT
KIT ROOM TURNOVER OR (KITS) ×2 IMPLANT
MANIFOLD NEPTUNE II (INSTRUMENTS) ×2 IMPLANT
NDL SUT 6 .5 CRC .975X.05 MAYO (NEEDLE) IMPLANT
NEEDLE HYPO 25GX1X1/2 BEV (NEEDLE) IMPLANT
NEEDLE MAYO TAPER (NEEDLE)
NEEDLE SCORPION MULTI FIRE (NEEDLE) IMPLANT
NEEDLE SPNL 18GX3.5 QUINCKE PK (NEEDLE) ×2 IMPLANT
PACK SHOULDER (CUSTOM PROCEDURE TRAY) ×2 IMPLANT
PAD ABD 8X10 STRL (GAUZE/BANDAGES/DRESSINGS) ×2 IMPLANT
PAD ARMBOARD 7.5X6 YLW CONV (MISCELLANEOUS) ×4 IMPLANT
PROBE BIPOLAR ATHRO 135MM 90D (MISCELLANEOUS) ×2 IMPLANT
RESECTOR FULL RADIUS 4.2MM (BLADE) ×2 IMPLANT
SLING ARM FOAM STRAP LRG (SOFTGOODS) ×2 IMPLANT
SLING ARM FOAM STRAP MED (SOFTGOODS) IMPLANT
SLING ARM IMMOBILIZER LRG (SOFTGOODS) ×2 IMPLANT
SPONGE LAP 4X18 X RAY DECT (DISPOSABLE) IMPLANT
SUPPORT WRAP ARM LG (MISCELLANEOUS) ×2 IMPLANT
SUT 2 FIBERLOOP 20 STRT BLUE (SUTURE)
SUT ETHILON 3 0 PS 1 (SUTURE) ×2 IMPLANT
SUT TIGER TAPE 7 IN WHITE (SUTURE) IMPLANT
SUTURE 2 FIBERLOOP 20 STRT BLU (SUTURE) IMPLANT
SUTURE TAPE 1.3 40 TPR END (SUTURE) IMPLANT
SUTURETAPE 1.3 40 TPR END (SUTURE)
SYR CONTROL 10ML LL (SYRINGE) ×2 IMPLANT
TAPE CLOTH SURG 4X10 WHT LF (GAUZE/BANDAGES/DRESSINGS) ×2 IMPLANT
TAPE FIBER 2MM 7IN #2 BLUE (SUTURE) IMPLANT
TOWEL OR 17X24 6PK STRL BLUE (TOWEL DISPOSABLE) ×2 IMPLANT
TOWEL OR 17X26 10 PK STRL BLUE (TOWEL DISPOSABLE) ×2 IMPLANT
TOWEL OR NON WOVEN STRL DISP B (DISPOSABLE) ×2 IMPLANT
TUBE CONNECTING 12X1/4 (SUCTIONS) ×2 IMPLANT
TUBING ARTHROSCOPY IRRIG 16FT (MISCELLANEOUS) ×2 IMPLANT
WAND HAND CNTRL MULTIVAC 90 (MISCELLANEOUS) ×2 IMPLANT
WATER STERILE IRR 1000ML POUR (IV SOLUTION) ×2 IMPLANT

## 2017-05-31 NOTE — Op Note (Signed)
Procedure(s):   Naveyah Iacovelli Beissel female 71 y.o. 05/31/2017  Procedure(s) and Anesthesia Type: #1 arthroscopic debridement right shoulder partial rotator cuff tear, SLAP tear #2 arthroscopic subacromial decompression right shoulder #3 arthroscopic distal clavicle excision right shoulder   Surgeon(s) and Role:    Tania Ade, MD - Primary     Surgeon: Nita Sells   Assistants: Jeanmarie Hubert PA-C (Danielle was present and scrubbed throughout the procedure and was essential in positioning, assisting with the camera and instrumentation,, and closure)  Anesthesia: General endotracheal anesthesia with preoperative interscalene block given by the attending anesthesiologist    Procedure Detail   Estimated Blood Loss: Min         Drains: none  Blood Given: none         Specimens: none        Complications:  * No complications entered in OR log *         Disposition: PACU - hemodynamically stable.         Condition: stable    Procedure:   INDICATIONS FOR SURGERY: The patient is 71 y.o. female who has a long history of right shoulder pain which has been refractory extensive nonoperative management. Ultimately indicated for surgical treatment to try and decrease pain and restore function.  OPERATIVE FINDINGS: Examination under anesthesia: No stiffness or instability  DESCRIPTION OF PROCEDURE: The patient was identified in preoperative  holding area where I personally marked the operative site after  verifying site, side, and procedure with the patient. An interscalene block was given by the attending anesthesiologist the holding area.  The patient was taken back to the operating room where general anesthesia was induced without complication and was placed in the beach-chair position with the back  elevated about 60 degrees and all extremities and head and neck carefully padded and  positioned.   The right upper extremity was then prepped and  draped in  a standard sterile fashion. The appropriate time-out  procedure was carried out. The patient did receive IV antibiotics  within 30 minutes of incision.   A small posterior portal incision was made and the arthroscope was introduced into the joint. An anterior portal was then established above the subscapularis using needle localization. Small cannula was placed anteriorly. Diagnostic arthroscopy was then carried out.  The subscapularis was noted to be intact without partial or complete tearing. There was extensive tearing of the superior labrum with flap tears pain down into the joint. The shaver was used to extensively debride this back to healthy labrum. The remainder of the labrum including the origin of the biceps was intact. The biceps tendon was pulled into the joint and was healthy-appearing with only mild synovitis, no tears. Supraspinatus and infraspinatus were intact. There was some significant thinning of the chondral surfaces on the humeral head and glenoid but no grossly displaceable flaps.  The arthroscope was then introduced into the subacromial space a standard lateral portal was established with needle localization. The shaver was used through the lateral portal to perform extensive bursectomy. Coracoacromial ligament was examined and found to be frayed indicating chronic impingement.  The bursal surface of the rotator cuff was carefully examined. There was significant fraying anteriorly in the region of impingement but no exposed tuberosity. This was debrided down to bleeding healthy tendon.  The coracoacromial ligament was taken down off the anterior acromion with the ArthroCare exposing a large hooked anterior acromial spur. A high-speed bur was then used through the lateral portal to take down  the anterior acromial spur from lateral to medial in a standard acromioplasty.  The acromioplasty was also viewed from the lateral portal and the bur was used as necessary to ensure that the  acromion was completely flat from posterior to anterior.  The distal clavicle was exposed arthroscopically and the bur was used to take off the undersurface for approximately 8 mm from the lateral portal. The bur was then moved to an anterior portal position to complete the distal clavicle excision resecting about 8 mm of the distal clavicle and a smooth even fashion. This was viewed from anterior and lateral portals and felt to be complete.  The arthroscopic equipment was removed from the joint and the portals were closed with 3-0 nylon in an interrupted fashion. Sterile dressings were then applied including Xeroform 4 x 4's ABDs and tape. The patient was then allowed to awaken from general anesthesia, placed in a sling, transferred to the stretcher and taken to the recovery room in stable condition.   POSTOPERATIVE PLAN: The patient will be discharged home today and will followup in one week for suture removal and wound check.

## 2017-05-31 NOTE — Discharge Instructions (Signed)

## 2017-05-31 NOTE — Anesthesia Procedure Notes (Addendum)
Anesthesia Regional Block: Interscalene brachial plexus block   Pre-Anesthetic Checklist: ,, timeout performed, Correct Patient, Correct Site, Correct Laterality, Correct Procedure, Correct Position, site marked, Risks and benefits discussed,  Surgical consent,  Pre-op evaluation,  At surgeon's request and post-op pain management  Laterality: Right  Prep: chloraprep       Needles:  Injection technique: Single-shot  Needle Type: Echogenic Stimulator Needle     Needle Length: 5cm  Needle Gauge: 22     Additional Needles:   Procedures: ultrasound guided, nerve stimulator,,,,,,   Nerve Stimulator or Paresthesia:  Response: deltoid, 0.45 mA,   Additional Responses:   Narrative:  Start time: 05/31/2017 12:17 PM End time: 05/31/2017 12:25 PM Injection made incrementally with aspirations every 5 mL.  Performed by: Personally  Anesthesiologist: Jawuan Robb  Additional Notes: Functioning IV was confirmed and monitors were applied.  A 70mm 22ga Arrow echogenic stimulator needle was used. Sterile prep and drape,hand hygiene and sterile gloves were used. Ultrasound guidance: relevant anatomy identified, needle position confirmed, local anesthetic spread visualized around nerve(s)., vascular puncture avoided.  Image printed for medical record. Negative aspiration and negative test dose prior to incremental administration of local anesthetic. The patient tolerated the procedure well.

## 2017-05-31 NOTE — Anesthesia Procedure Notes (Signed)
Procedure Name: Intubation Performed by: Valda Favia Pre-anesthesia Checklist: Patient identified, Emergency Drugs available, Suction available, Patient being monitored and Timeout performed Patient Re-evaluated:Patient Re-evaluated prior to induction Oxygen Delivery Method: Circle system utilized Preoxygenation: Pre-oxygenation with 100% oxygen Induction Type: IV induction Ventilation: Mask ventilation without difficulty Laryngoscope Size: Mac and 4 Grade View: Grade II Tube type: Oral Tube size: 7.0 mm Number of attempts: 1 Airway Equipment and Method: Stylet and LTA kit utilized Placement Confirmation: ETT inserted through vocal cords under direct vision,  positive ETCO2 and breath sounds checked- equal and bilateral Secured at: 21 cm Tube secured with: Tape Dental Injury: Teeth and Oropharynx as per pre-operative assessment

## 2017-05-31 NOTE — H&P (Signed)
Jessica Armstrong is an 71 y.o. female.   Chief Complaint: R shoulder pain  HPI: R shoulder impingement, partial RCT, AC arthropathy failed extensive conservative management with rest, injections, NSAIDs and therapy.  Past Medical History:  Diagnosis Date  . Anemia    mild; no medical treatment  . Arthritis   . Carotid artery stenosis    a. doppler 12/7900: RICA 40-97%, LICA 3-53% f/u 1 year  . Cervical spondyloarthritis   . Chronic diastolic CHF (congestive heart failure) ([place])    a. echo 04/2015: EF 55-60%, no RWMA, mitral valve w/ systolic bowing w/o prolapse, PASP 53 mmHg  . Claudication ([place])   . Colon polyp   . Cystitis   . Depression    situational  . Dyspnea   . GERD (gastroesophageal reflux disease)   . H/O vertigo   . History of measles   . History of mumps   . Hypercholesterolemia   . Hyperglycemia   . Hypertension   . Need for SBE (subacute bacterial endocarditis) prophylaxis   . Neuropathy   . Palpitations   . PONV (postoperative nausea and vomiting)    severe n/v after anesthesia  . Post-menopausal   . Pulmonary valve dysplasia   . Seasonal allergies   . TMJ (dislocation of temporomandibular joint)   . Tricuspid regurgitation    a. echo 04/2013: EF 55-60%, nl wall motion, mild to mod TR, PASP 35 mm Hg    Past Surgical History:  Procedure Laterality Date  . ABDOMINAL HYSTERECTOMY    . ANTERIOR CERVICAL DECOMP/DISCECTOMY FUSION N/A 08/26/2013   Procedure: CERVICAL SIX TO SEVEN ANTERIOR CERVICAL DECOMPRESSION/DISCECTOMY FUSION 1 LEVEL;  Surgeon: Erline Levine, MD;  Location: [facility] NEURO ORS;  Service: Neurosurgery;  Laterality: N/A;  C6-7 Anterior cervical decompression/diskectomy/fusion  . BREAST BIOPSY Right    neg  . CHOLECYSTECTOMY    . COLONOSCOPY WITH PROPOFOL N/A 01/14/2016   Procedure: COLONOSCOPY WITH PROPOFOL;  Surgeon: Manya Silvas, MD;  Location: [facility] ENDOSCOPY;  Service: Endoscopy;  Laterality: N/A;  . ESOPHAGOGASTRODUODENOSCOPY (EGD) WITH PROPOFOL N/A  01/14/2016   Procedure: ESOPHAGOGASTRODUODENOSCOPY (EGD) WITH PROPOFOL;  Surgeon: Manya Silvas, MD;  Location: [facility] ENDOSCOPY;  Service: Endoscopy;  Laterality: N/A;  . EYE SURGERY     benign tumor of eye    Family History  Problem Relation Age of Onset  . Heart disease Mother   . Hypertension Mother   . COPD Mother        was a smoker  . Hypertension Brother   . Colon cancer Father   . Asthma Maternal Grandfather   . Breast cancer Neg Hx    Social History:  reports that she has never smoked. She has never used smokeless tobacco. She reports that she drinks alcohol. She reports that she does not use drugs.  Allergies:  Allergies  Allergen Reactions  . Ciprofloxacin Nausea And Vomiting  . Codeine Sulfate Nausea And Vomiting  . Keflex [Cephalexin] Nausea And Vomiting  . Macrobid [Nitrofurantoin Macrocrystal] Nausea And Vomiting  . Sulfa Antibiotics Nausea And Vomiting    Medications Prior to Admission  Medication Sig Dispense Refill  . albuterol (PROAIR HFA) 108 (90 Base) MCG/ACT inhaler Inhale 1 puff into the lungs every 4 (four) hours as needed for wheezing or shortness of breath. 3 Inhaler 3  . Cinnamon 500 MG capsule Take 500 mg by mouth daily.    . diazepam (VALIUM) 2 MG tablet Take 1 tablet (2 mg total) by mouth every 12 (twelve) hours  as needed for anxiety. (Patient taking differently: Take 2 mg by mouth every 12 (twelve) hours as needed for anxiety (TMJ (JAW PAIN)). ) 60 tablet 0  . docusate sodium (COLACE) 100 MG capsule Take 100 mg by mouth daily.    Marland Kitchen doxycycline (VIBRA-TABS) 100 MG tablet Take 1 tablet (100 mg total) by mouth 2 (two) times daily. (Patient taking differently: Take 100 mg by mouth See admin instructions. TAKES TWICE DAILY AS NEEDED FOR DIVERTICULITIS) 20 tablet 1  . hydrALAZINE (APRESOLINE) 50 MG tablet Take 1 tablet (50 mg total) by mouth 3 (three) times daily. 270 tablet 3  . loratadine (CLARITIN) 10 MG tablet Take 1 tablet (10 mg total) by mouth  daily. (Patient not taking: Reported on 05/17/2017) 90 tablet 3  . meclizine (ANTIVERT) 25 MG tablet Take 1 tablet (25 mg total) by mouth daily as needed for dizziness. (Patient taking differently: Take 25 mg by mouth 2 (two) times daily as needed for dizziness. ) 90 tablet 3  . metoprolol succinate (TOPROL-XL) 25 MG 24 hr tablet TAKE ONE TABLET EVERY DAY 90 tablet 3  . Multiple Vitamin (MULTIVITAMIN) tablet Take 1 tablet by mouth daily.    Marland Kitchen OVER THE COUNTER MEDICATION Take 1 tablet by mouth daily. Clear lung     . pantoprazole (PROTONIX) 40 MG tablet TAKE ONE TABLET BY MOUTH TWICE DAILY (Patient taking differently: TAKE ONE TABLET BY MOUTH DAILY) 180 tablet 3  . PARoxetine (PAXIL) 20 MG tablet Take 1 tablet (20 mg total) by mouth daily. 90 tablet 3  . Red Yeast Rice Extract (RED YEAST RICE PO) Take 1 tablet by mouth daily.     Marland Kitchen triamterene-hydrochlorothiazide (DYAZIDE) 37.5-25 MG capsule Take 1 capsule by mouth daily.    . verapamil (CALAN-SR) 180 MG CR tablet TAKE ONE TABLET TWICE DAILY 180 tablet 3    No results found for this or any previous visit (from the past 48 hour(s)). No results found.  Review of Systems  All other systems reviewed and are negative.   There were no vitals taken for this visit. Physical Exam  Constitutional: She is oriented to person, place, and time. She appears well-developed and well-nourished.  HENT:  Head: Atraumatic.  Eyes: EOM are normal.  Cardiovascular: Intact distal pulses.   Respiratory: Effort normal.  Musculoskeletal:  R shoulder pain with RC testing, TTP at [facility] joint  Neurological: She is alert and oriented to person, place, and time.  Skin: Skin is warm and dry.  Psychiatric: She has a normal mood and affect.     Assessment/Plan R shoulder impingement, partial RCT, AC arthropathy failed extensive conservative management with rest, injections, NSAIDs and therapy. Plan R shoulder arth debride RCT, SAD, DCE Risks / benefits of surgery  discussed Consent on chart  NPO for OR Preop antibiotics   Nita Sells, MD 05/31/2017, 11:31 AM

## 2017-05-31 NOTE — Transfer of Care (Signed)
Immediate Anesthesia Transfer of Care Note  [name] [facility]  Procedure(s) Performed: Procedure(s) with comments: SHOULDER ARTHROSCOPY WITH SUBACROMIAL DECOMPRESSION AND DISTAL CLAVICAL EXCISION (Right) - SHOULDER ARTHROSCOPY WITH SUBACROMIAL DECOMPRESSION AND DISTAL CLAVICAL EXCISION  Patient Location: PACU  Anesthesia Type:GA combined with regional for post-op pain  Level of Consciousness: sedated  Airway & Oxygen Therapy: Patient Spontanous Breathing and Patient connected to nasal cannula oxygen  Post-op Assessment: Report given to RN and Post -op Vital signs reviewed and stable  Post vital signs: Reviewed and stable  Last Vitals:  Vitals:   05/31/17 1215 05/31/17 1400  BP: (!) 199/57 (!) 159/55  Pulse: 62   Resp: 13   Temp:  36.6 C  SpO2: 99%     Last Pain:  Vitals:   05/31/17 1400  TempSrc:   PainSc: Asleep      Patients Stated Pain Goal: 2 (33/38/32 9191)  Complications: No apparent anesthesia complications

## 2017-05-31 NOTE — Anesthesia Preprocedure Evaluation (Addendum)
Anesthesia Evaluation  Patient identified by MRN, date of birth, ID band Patient awake    Reviewed: Allergy & Precautions, NPO status , Patient's Chart, lab work & pertinent test results, reviewed documented beta blocker date and time   History of Anesthesia Complications (+) PONV and history of anesthetic complications  Airway Mallampati: II  TM Distance: >3 FB     Dental  (+) Chipped, Partial Lower   Pulmonary shortness of breath,    Pulmonary exam normal breath sounds clear to auscultation       Cardiovascular hypertension, Pt. on medications + Peripheral Vascular Disease and +CHF  negative cardio ROS Normal cardiovascular exam+ Valvular Problems/Murmurs  Rhythm:Regular Rate:Normal     Neuro/Psych PSYCHIATRIC DISORDERS Anxiety Depression    GI/Hepatic negative GI ROS,   Endo/Other    Renal/GU      Musculoskeletal  (+) Arthritis ,   Abdominal   Peds  Hematology  (+) anemia ,   Anesthesia Other Findings Echo 07/07/16:  1. Normal LV systolic function with mild LVH. EF >55%. 2. Normal RV systolic function. 3. Mild MR, mild TR, mild PR. 4. No valvular stenosis. 5. Mild to moderate PI  Reproductive/Obstetrics                                                             Anesthesia Evaluation  Patient identified by MRN, date of birth, ID band Patient awake    Reviewed: Allergy & Precautions, NPO status , Patient's Chart, lab work & pertinent test results, reviewed documented beta blocker date and time   History of Anesthesia Complications (+) PONV and history of anesthetic complications  Airway Mallampati: II  TM Distance: >3 FB     Dental  (+) Chipped, Partial Lower   Pulmonary shortness of breath,           Cardiovascular hypertension, Pt. on medications + Peripheral Vascular Disease and +CHF  + Valvular Problems/Murmurs      Neuro/Psych PSYCHIATRIC  DISORDERS Anxiety Depression    GI/Hepatic   Endo/Other    Renal/GU      Musculoskeletal  (+) Arthritis ,   Abdominal   Peds  Hematology  (+) anemia ,   Anesthesia Other Findings Echo 07/07/16:  1. Normal LV systolic function with mild LVH. EF >55%. 2. Normal RV systolic function. 3. Mild MR, mild TR, mild PR. 4. No valvular stenosis. 5. Mild to moderate PI  Reproductive/Obstetrics                             Anesthesia Physical  Anesthesia Plan  ASA: III  Anesthesia Plan: General   Post-op Pain Management:    Induction: Intravenous  PONV Risk Score and Plan:   Airway Management Planned: Nasal Cannula  Additional Equipment:   Intra-op Plan:   Post-operative Plan:   Informed Consent: I have reviewed the patients History and Physical, chart, labs and discussed the procedure including the risks, benefits and alternatives for the proposed anesthesia with the patient or authorized representative who has indicated his/her understanding and acceptance.     Plan Discussed with: CRNA  Anesthesia Plan Comments:         Anesthesia Quick Evaluation  Anesthesia Physical  Anesthesia Plan  ASA: III  Anesthesia Plan:  General   Post-op Pain Management:  Regional for Post-op pain   Induction: Intravenous  PONV Risk Score and Plan: 2 and Ondansetron, Dexamethasone, Treatment may vary due to age or medical condition, Scopolamine patch - Pre-op, Midazolam and Propofol infusion  Airway Management Planned: Oral ETT  Additional Equipment:   Intra-op Plan:   Post-operative Plan: Extubation in OR  Informed Consent: I have reviewed the patients History and Physical, chart, labs and discussed the procedure including the risks, benefits and alternatives for the proposed anesthesia with the patient or authorized representative who has indicated his/her understanding and acceptance.     Plan Discussed with: CRNA  Anesthesia Plan  Comments: (  )       Anesthesia Quick Evaluation

## 2017-06-01 ENCOUNTER — Encounter (HOSPITAL_COMMUNITY): Payer: Self-pay | Admitting: Orthopedic Surgery

## 2017-06-01 NOTE — Anesthesia Postprocedure Evaluation (Signed)
Anesthesia Post Note  Patient: [facility]  Procedure(s) Performed: Procedure(s) (LRB): SHOULDER ARTHROSCOPY WITH SUBACROMIAL DECOMPRESSION AND DISTAL CLAVICAL EXCISION (Right)     Patient location during evaluation: PACU Anesthesia Type: General Level of consciousness: awake and alert Pain management: pain level controlled Vital Signs Assessment: post-procedure vital signs reviewed and stable Respiratory status: spontaneous breathing, nonlabored ventilation, respiratory function stable and patient connected to nasal cannula oxygen Cardiovascular status: blood pressure returned to baseline and stable Postop Assessment: no signs of nausea or vomiting Anesthetic complications: no    Last Vitals:  Vitals:   05/31/17 1535 05/31/17 1604  BP:  104/72  Pulse:  61  Resp:  14  Temp: (!) 36.3 C   SpO2:  100%    Last Pain:  Vitals:   05/31/17 1500  TempSrc:   PainSc: 0-No pain                 Romel Dumond

## 2017-06-04 IMAGING — CR DG ABDOMEN 1V
1 series · 2 of 2 positions shown · non-contrast
Comparison: 02/14/2008 CT abdomen/ pelvis.

CLINICAL DATA: Epigastric abdominal pain. Patient reports right
flank pain.

EXAM:
ABDOMEN - 1 VIEW

[Series 1: dg abd 1 view · 0.14mm/px · 2 of 2 slices shown]
[im 1/2]
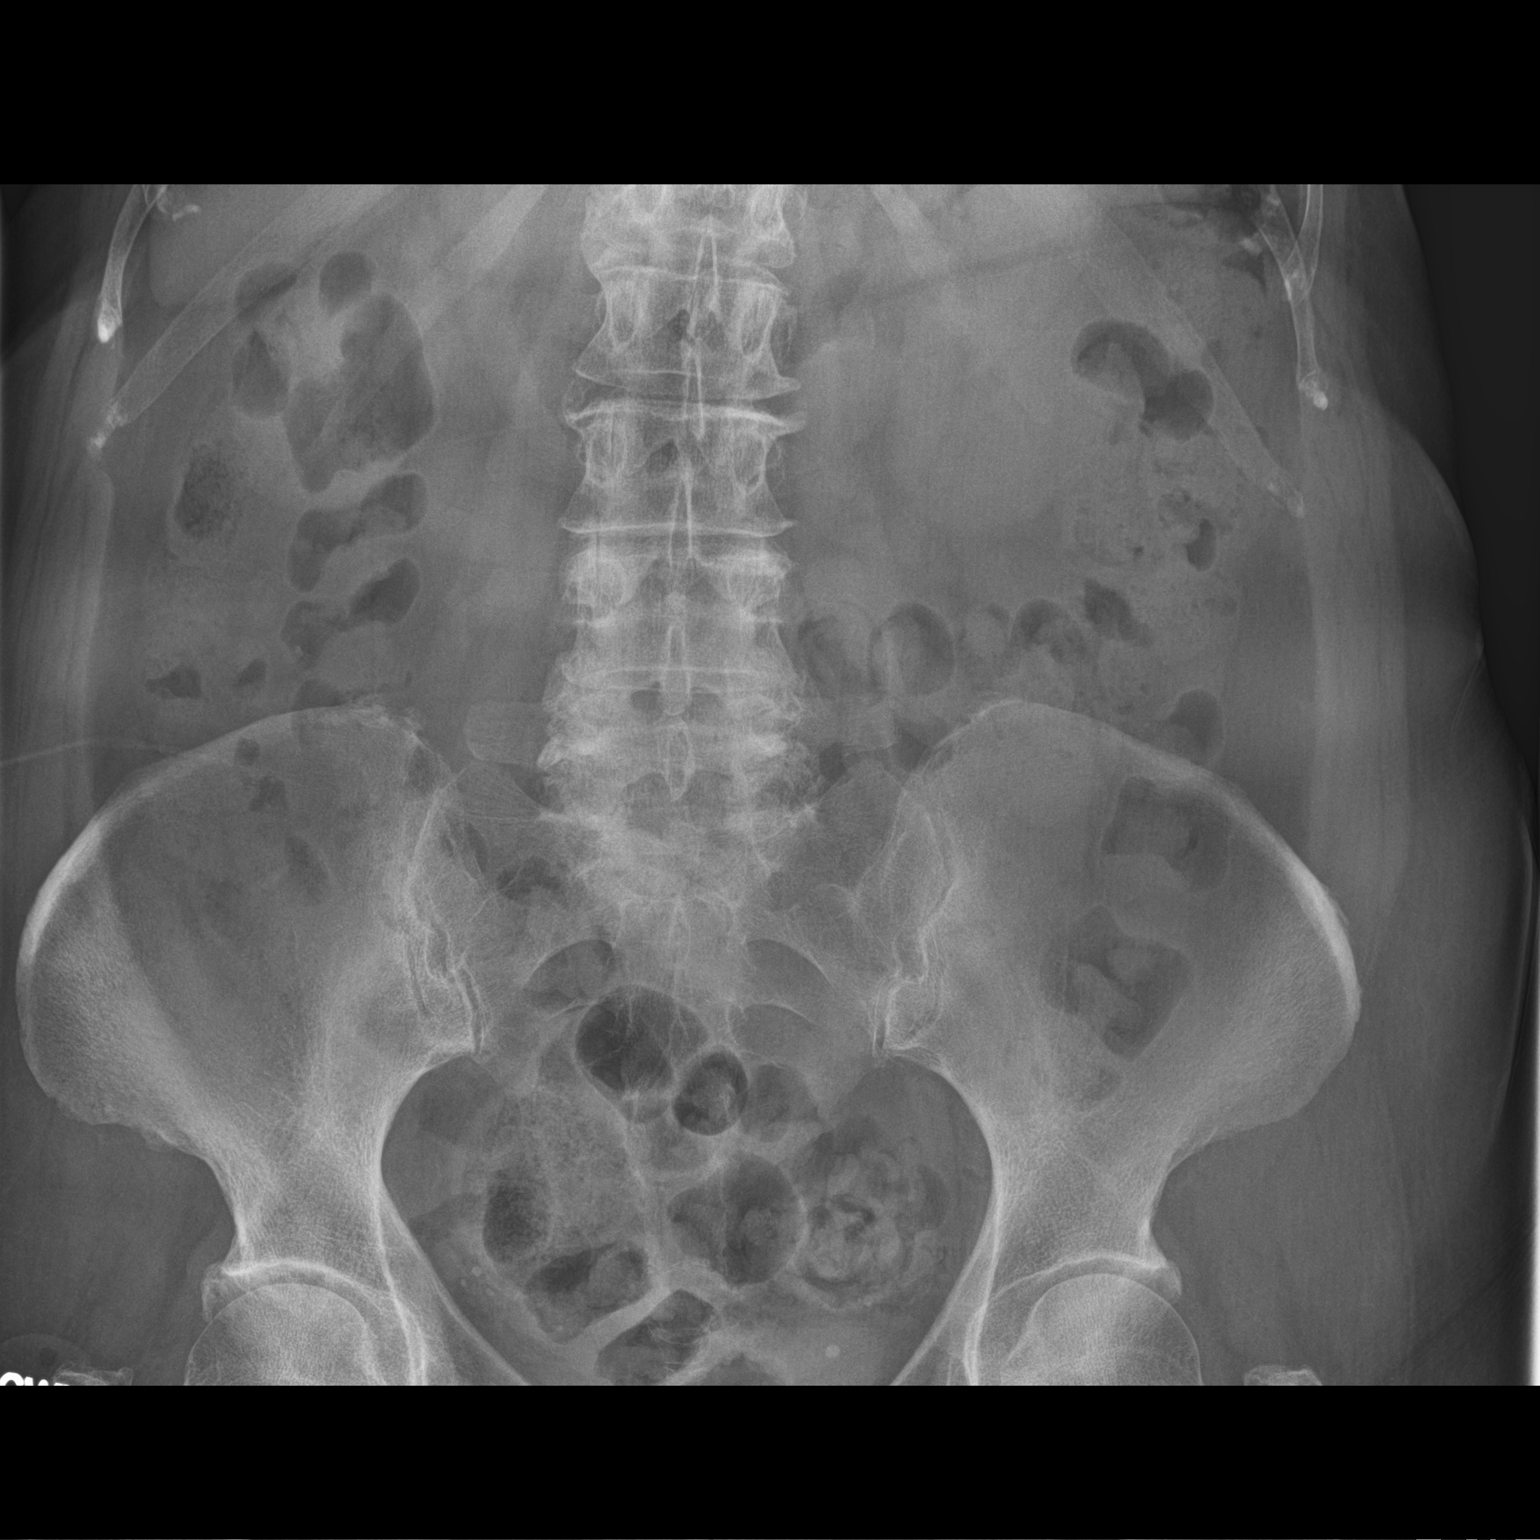
[im 2/2]
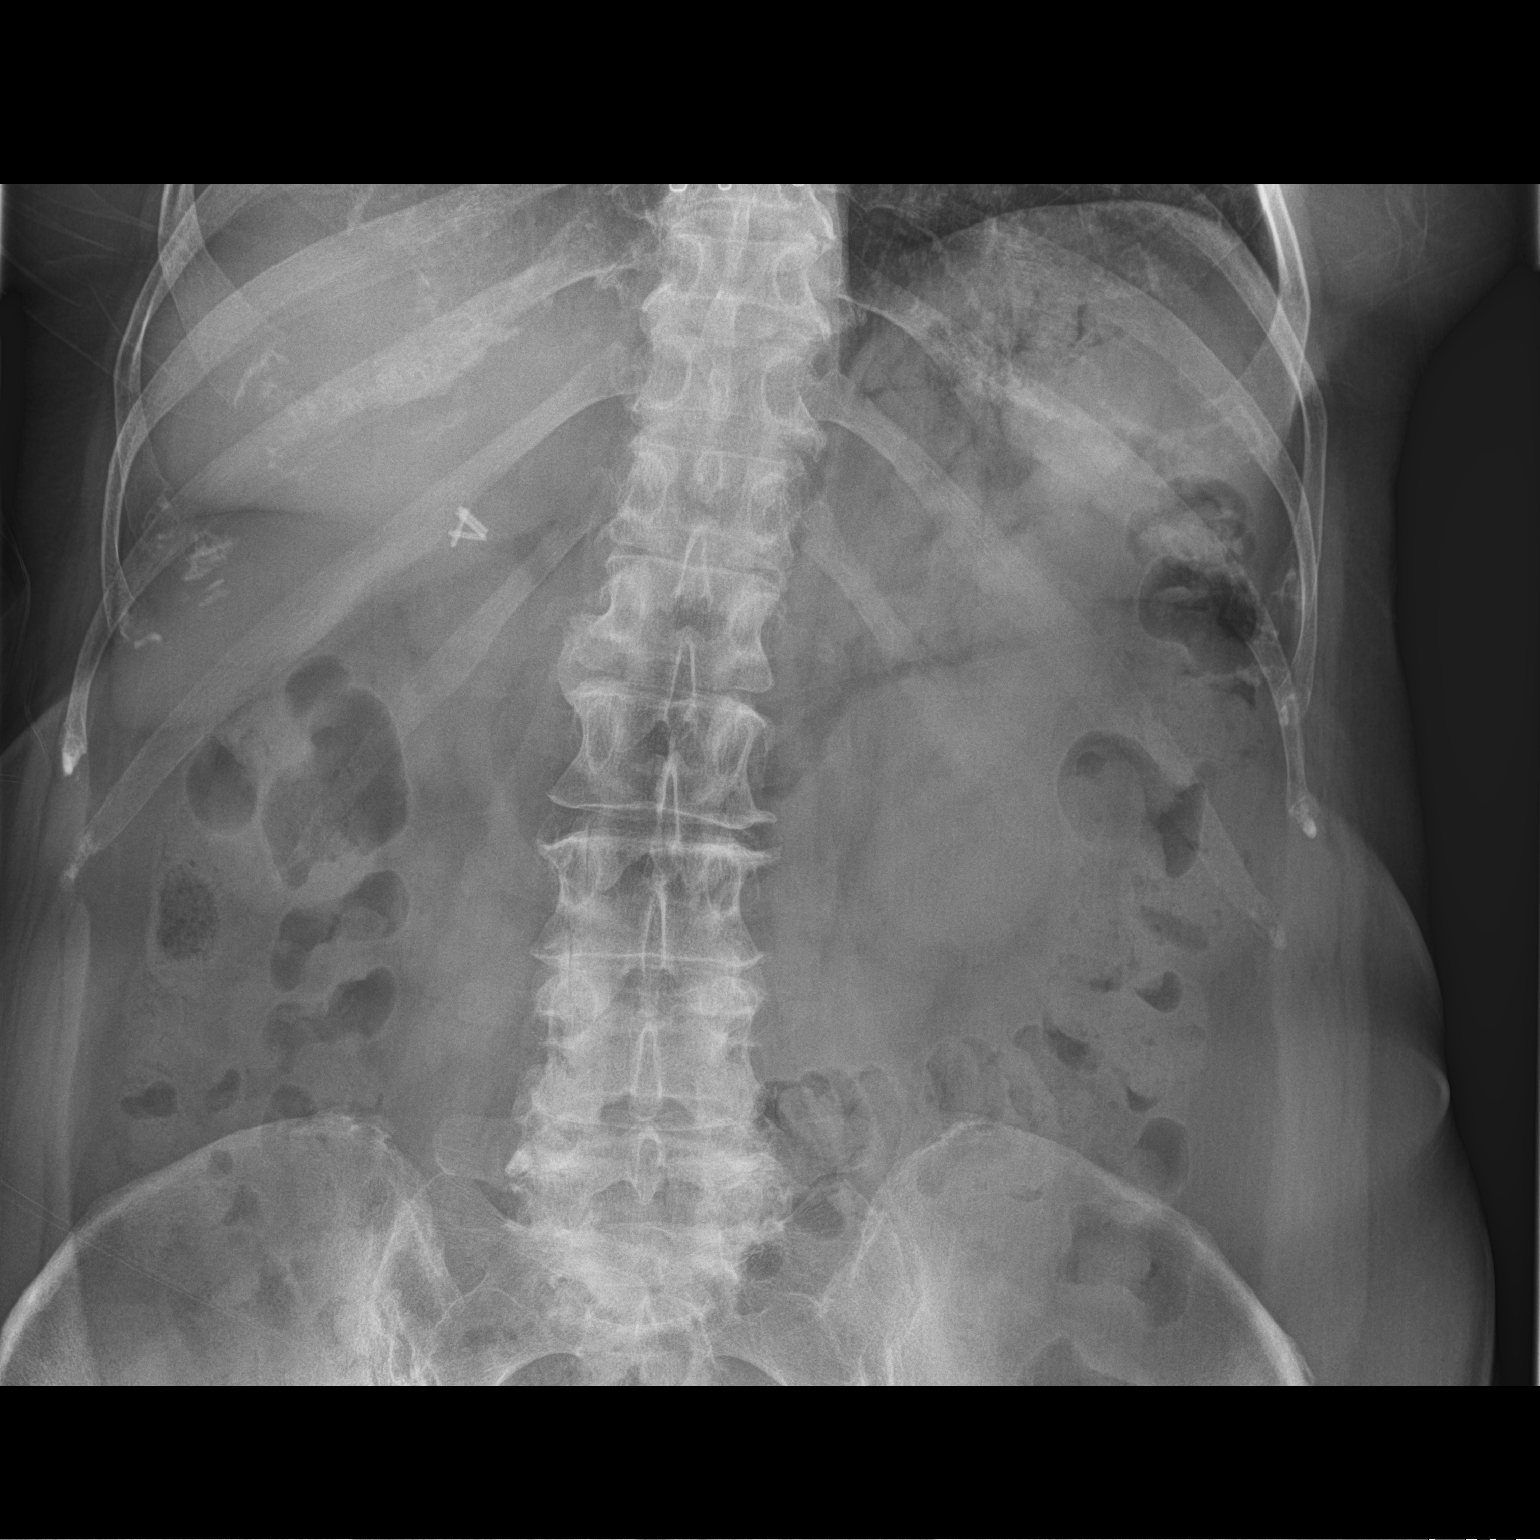

[2 of 2 positions shown; findings below may reference images not displayed]

FINDINGS: Cholecystectomy clips are seen in the right upper quadrant of the
abdomen. No dilated small bowel loops. Mild physiologic stool
throughout the colon. No evidence of pneumatosis or
pneumoperitoneum. No pathologic soft tissue densities. Moderate
degenerative changes in the lumbar spine.
IMPRESSION: Nonobstructive bowel gas pattern.  No radiopaque urolithiasis.

## 2017-06-15 ENCOUNTER — Other Ambulatory Visit: Payer: Self-pay | Admitting: Family Medicine

## 2017-06-15 NOTE — Telephone Encounter (Signed)
Pharmacy requesting refills. Thanks!  

## 2017-06-20 DIAGNOSIS — M25511 Pain in right shoulder: Secondary | ICD-10-CM | POA: Diagnosis not present

## 2017-06-26 ENCOUNTER — Telehealth: Payer: Self-pay | Admitting: Family Medicine

## 2017-06-26 MED ORDER — FLUCONAZOLE 150 MG PO TABS: 150.0000 mg | ORAL_TABLET | Freq: Once | ORAL | 0 refills | Status: AC

## 2017-06-26 NOTE — Telephone Encounter (Signed)
I would try monistat vaginal cream for 3 mights a month later.

## 2017-06-26 NOTE — Telephone Encounter (Signed)
Spoke with patient and she states she tried cream before but did not work as fast. She is itching vaginally really bad. Spoke with Dr Rosanna Randy and RX for Diflucan 1 time sent in, no refill. Patient advised as per Dr Rosanna Randy if this does not improve to come in and be seen. Patient understood-Eura Radabaugh Estell Harpin, RMA

## 2017-06-26 NOTE — Telephone Encounter (Signed)
Pt had surgery about a month ago.  She had to take an antibiotic before the surgery.  She now has a yeast infection and need the diflucan pill  She uses [facility]  Thanks Con Memos

## 2017-06-26 NOTE — Telephone Encounter (Signed)
Please review-Anastasiya V Hopkins, RMA  

## 2017-09-13 ENCOUNTER — Other Ambulatory Visit: Payer: Self-pay | Admitting: Family Medicine

## 2017-09-15 ENCOUNTER — Other Ambulatory Visit: Payer: Self-pay | Admitting: Family Medicine

## 2017-10-22 ENCOUNTER — Other Ambulatory Visit: Payer: Self-pay

## 2017-10-22 ENCOUNTER — Telehealth: Payer: Self-pay | Admitting: Family Medicine

## 2017-10-22 ENCOUNTER — Ambulatory Visit (INDEPENDENT_AMBULATORY_CARE_PROVIDER_SITE_OTHER): Payer: PPO | Admitting: Family Medicine

## 2017-10-22 VITALS — BP 130/60 | HR 68 | Temp 97.9°F | Resp 14 | Wt 160.0 lb

## 2017-10-22 DIAGNOSIS — R1030 Lower abdominal pain, unspecified: Secondary | ICD-10-CM | POA: Diagnosis not present

## 2017-10-22 DIAGNOSIS — K5792 Diverticulitis of intestine, part unspecified, without perforation or abscess without bleeding: Secondary | ICD-10-CM

## 2017-10-22 LAB — POCT URINALYSIS DIPSTICK
Bilirubin, UA: NEGATIVE
Blood, UA: NEGATIVE
Glucose, UA: NEGATIVE
Ketones, UA: NEGATIVE
Leukocytes, UA: NEGATIVE
Nitrite, UA: NEGATIVE
Protein, UA: NEGATIVE
Spec Grav, UA: 1.01 (ref 1.010–1.025)
Urobilinogen, UA: 0.2 E.U./dL
pH, UA: 6 (ref 5.0–8.0)

## 2017-10-22 MED ORDER — DOXYCYCLINE HYCLATE 100 MG PO TABS
100.0000 mg | ORAL_TABLET | Freq: Two times a day (BID) | ORAL | 0 refills | Status: DC
Start: 2017-10-22 — End: 2017-11-10

## 2017-10-22 NOTE — Telephone Encounter (Signed)
Pt called to schedule an appt with Dr. Rosanna Randy today for lower abdominal pain. Pt was advised that Dr. Rosanna Randy doesn't have an open appt until Thursday 10/25/17. Pt was offered appts with other providers today. Pt requested that [place] return her call. Pt stated that she only wants to see Dr. Rosanna Randy b/c he is aware and has treated her for this pain in the past but this time the pain seems lower in her abdomen than last time. Please advise. Thanks TNP

## 2017-10-22 NOTE — Telephone Encounter (Signed)
Pt advised that someone cancelled today and we do have opening at this time, patient will come in at 1:30 [employer], RMA

## 2017-10-22 NOTE — Progress Notes (Signed)
Jessica Armstrong  MRN: [id number] DOB: 1946-07-30  Subjective:  HPI   The patient is a 72 year old female who presents for evaluation of lower abdominal/pelvic pain that radiates to both sides.  She also complains of back pain, burning with urination, enuresis after voiding, diarrhea 2-3 times daily.  She started having symptoms 3 days ago.  She also has history of divericulosis.  Patient Active Problem List   Diagnosis Date Noted  . Dizziness 06/30/2016  . Carotid artery stenosis   . Chronic diastolic CHF (congestive heart failure) ([place])   . Fatigue 04/19/2015  . Anxiety 04/19/2015  . Allergic rhinitis 04/19/2015  . Arthritis 04/19/2015  . Disorder of carbohydrate transport and metabolism ([place]) 04/19/2015  . Diverticulitis 04/19/2015  . Acid reflux 04/19/2015  . Hormone replacement therapy (postmenopausal) 04/19/2015  . Hypercholesteremia 04/19/2015  . Blood glucose elevated 04/19/2015  . Neuropathy 04/19/2015  . Pericarditis 04/19/2015  . Common wart 04/19/2015  . Hot flashes 06/10/2013  . Hemoptysis 04/21/2013  . Chest pain 04/08/2013  . SOB (shortness of breath) 04/08/2013  . Cough 04/08/2013  . Murmur 04/08/2013  . Essential hypertension 04/08/2013    Past Medical History:  Diagnosis Date  . Anemia    mild; no medical treatment  . Arthritis   . Carotid artery stenosis    a. doppler 06/299: RICA 92-33%, LICA 0-07% f/u 1 year  . Cervical spondyloarthritis   . Chronic diastolic CHF (congestive heart failure) ([place])    a. echo 04/2015: EF 55-60%, no RWMA, mitral valve w/ systolic bowing w/o prolapse, PASP 53 mmHg  . Claudication ([place])   . Colon polyp   . Cystitis   . Depression    situational  . Dyspnea   . GERD (gastroesophageal reflux disease)   . H/O vertigo   . History of measles   . History of mumps   . Hypercholesterolemia   . Hyperglycemia   . Hypertension   . Need for SBE (subacute bacterial endocarditis) prophylaxis   . Neuropathy   . Palpitations     . PONV (postoperative nausea and vomiting)    severe n/v after anesthesia  . Post-menopausal   . Pulmonary valve dysplasia   . Rotator cuff tear, right   . Seasonal allergies   . TMJ (dislocation of temporomandibular joint)   . Tricuspid regurgitation    a. echo 04/2013: EF 55-60%, nl wall motion, mild to mod TR, PASP 35 mm Hg    Social History   Socioeconomic History  . Marital status: Married    Spouse name: Not on file  . Number of children: Not on file  . Years of education: Not on file  . Highest education level: Not on file  Social Needs  . Financial resource strain: Not on file  . Food insecurity - worry: Not on file  . Food insecurity - inability: Not on file  . Transportation needs - medical: Not on file  . Transportation needs - non-medical: Not on file  Occupational History  . Not on file  Tobacco Use  . Smoking status: Never Smoker  . Smokeless tobacco: Never Used  . 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 Alcohol use: Yes    Comment: rare use 1-2 drinks per year.   . Drug use: No  . Sexual activity: Not on file  Other Topics Concern  . Not on file  Social History Narrative  . Not on  file    Outpatient Encounter Medications as of 10/22/2017  Medication Sig Note  . albuterol (PROAIR HFA) 108 (90 Base) MCG/ACT inhaler Inhale 1 puff into the lungs every 4 (four) hours as needed for wheezing or shortness of breath.   . Cinnamon 500 MG capsule Take 500 mg by mouth daily.   . diazepam (VALIUM) 2 MG tablet Take 1 tablet (2 mg total) by mouth every 12 (twelve) hours as needed for anxiety. (Patient taking differently: Take 2 mg by mouth every 12 (twelve) hours as needed for anxiety (TMJ (JAW PAIN)). )   . docusate sodium (COLACE) 100 MG capsule Take 100 mg by mouth daily.   . hydrALAZINE (APRESOLINE) 50 MG tablet TAKE ONE TABLET 3 TIMES DAILY   . loratadine (CLARITIN) 10 MG tablet Take 1 tablet (10 mg  total) by mouth daily.   . meclizine (ANTIVERT) 25 MG tablet Take 1 tablet (25 mg total) by mouth daily as needed for dizziness. (Patient taking differently: Take 25 mg by mouth 2 (two) times daily as needed for dizziness. )   . metoprolol succinate (TOPROL-XL) 25 MG 24 hr tablet TAKE ONE TABLET BY MOUTH EVERY DAY   . Multiple Vitamin (MULTIVITAMIN) tablet Take 1 tablet by mouth daily.   Marland Kitchen OVER THE COUNTER MEDICATION Take 1 tablet by mouth daily. Clear lung    . pantoprazole (PROTONIX) 40 MG tablet TAKE ONE TABLET BY MOUTH TWICE DAILY (Patient taking differently: TAKE ONE TABLET BY MOUTH DAILY)   . PARoxetine (PAXIL) 20 MG tablet TAKE ONE TABLET EVERY DAY   . Red Yeast Rice Extract (RED YEAST RICE PO) Take 1 tablet by mouth daily.    Marland Kitchen triamterene-hydrochlorothiazide (DYAZIDE) 37.5-25 MG capsule TAKE 1 CAPSULE BY MOUTH EVERY DAY   . verapamil (CALAN-SR) 180 MG CR tablet TAKE ONE TABLET TWICE DAILY   . doxycycline (VIBRA-TABS) 100 MG tablet Take 1 tablet (100 mg total) by mouth 2 (two) times daily. (Patient not taking: Reported on 10/22/2017) 05/17/2017: NOT CURRENTLY TAKING   . [DISCONTINUED] oxyCODONE-acetaminophen (ROXICET) 5-325 MG tablet Take 1-2 tablets by mouth every 4 (four) hours as needed for severe pain.   . [DISCONTINUED] triamterene-hydrochlorothiazide (DYAZIDE) 37.5-25 MG capsule Take 1 capsule by mouth daily.    No facility-administered encounter medications on file as of 10/22/2017.     Allergies  Allergen Reactions  . Ciprofloxacin Nausea And Vomiting  . Codeine Sulfate Nausea And Vomiting  . Keflex [Cephalexin] Nausea And Vomiting  . Macrobid [Nitrofurantoin Macrocrystal] Nausea And Vomiting  . Sulfa Antibiotics Nausea And Vomiting    Review of Systems  Constitutional: Positive for chills and malaise/fatigue. Negative for diaphoresis, fever and weight loss.  Eyes: Negative.   Respiratory: Negative for cough, shortness of breath and wheezing.   Cardiovascular: Negative for  chest pain, palpitations, orthopnea, claudication and leg swelling.  Gastrointestinal: Positive for abdominal pain, diarrhea and nausea. Negative for blood in stool, constipation, heartburn, melena and vomiting.  Genitourinary: Positive for dysuria (burning). Negative for frequency, hematuria and urgency.  Skin: Negative.   Neurological: Negative.  Negative for weakness.  Endo/Heme/Allergies: Negative.   Psychiatric/Behavioral: Negative.     Objective:  BP 130/60 (BP Location: Right Arm, Patient Position: Sitting, Cuff Size: Normal)   Pulse 68   Temp 97.9 F (36.6 C) (Oral)   Resp 14   Wt 160 lb (72.6 kg)   BMI 25.06 kg/m   Physical Exam  Constitutional: She is oriented to person, place, and time and well-developed, well-nourished, and in  no distress.  HENT:  Head: Normocephalic and atraumatic.  Eyes: Conjunctivae are normal. Pupils are equal, round, and reactive to light.  Neck: Normal range of motion.  Cardiovascular: Normal rate, regular rhythm and normal heart sounds.  Pulmonary/Chest: Effort normal and breath sounds normal.  Abdominal: Soft. Bowel sounds are normal. She exhibits no distension. There is tenderness (across entire pelvic area.). There is no rebound and no guarding.  Neurological: She is alert and oriented to person, place, and time. Gait normal. GCS score is 15.  Skin: Skin is warm and dry.  Psychiatric: Mood, memory, affect and judgment normal.    Assessment and Plan :  1. Lower abdominal pain  - POCT urinalysis dipstick  2. Diverticulitis  - doxycycline (VIBRA-TABS) 100 MG tablet; Take 1 tablet (100 mg total) by mouth 2 (two) times daily.  Dispense: 20 tablet; Refill: 0  I have done the exam and reviewed the chart and it is accurate to the best of my knowledge. Development worker, community has been used and  any errors in dictation or transcription are unintentional. Miguel Aschoff M.D. [facility]

## 2017-10-22 NOTE — Patient Instructions (Signed)
Clears liquids x 24-48 hours.  Then start the BRAT diet-Bananas, Rice, Applesauce and Toast.  Increase fluid intake.

## 2017-10-25 ENCOUNTER — Ambulatory Visit: Payer: Self-pay | Admitting: Physician Assistant

## 2017-11-01 ENCOUNTER — Encounter: Payer: Self-pay | Admitting: Family Medicine

## 2017-11-09 ENCOUNTER — Emergency Department: Payer: PPO

## 2017-11-09 DIAGNOSIS — I5032 Chronic diastolic (congestive) heart failure: Secondary | ICD-10-CM | POA: Diagnosis not present

## 2017-11-09 DIAGNOSIS — Z9049 Acquired absence of other specified parts of digestive tract: Secondary | ICD-10-CM | POA: Insufficient documentation

## 2017-11-09 DIAGNOSIS — I2510 Atherosclerotic heart disease of native coronary artery without angina pectoris: Secondary | ICD-10-CM | POA: Insufficient documentation

## 2017-11-09 DIAGNOSIS — R1013 Epigastric pain: Secondary | ICD-10-CM | POA: Diagnosis not present

## 2017-11-09 DIAGNOSIS — K7689 Other specified diseases of liver: Secondary | ICD-10-CM | POA: Diagnosis not present

## 2017-11-09 DIAGNOSIS — R0602 Shortness of breath: Secondary | ICD-10-CM | POA: Diagnosis not present

## 2017-11-09 DIAGNOSIS — Z79899 Other long term (current) drug therapy: Secondary | ICD-10-CM | POA: Insufficient documentation

## 2017-11-09 DIAGNOSIS — I700 Atherosclerosis of aorta: Secondary | ICD-10-CM | POA: Diagnosis not present

## 2017-11-09 DIAGNOSIS — R079 Chest pain, unspecified: Secondary | ICD-10-CM | POA: Diagnosis not present

## 2017-11-09 DIAGNOSIS — R0781 Pleurodynia: Secondary | ICD-10-CM | POA: Diagnosis not present

## 2017-11-09 DIAGNOSIS — F329 Major depressive disorder, single episode, unspecified: Secondary | ICD-10-CM | POA: Insufficient documentation

## 2017-11-09 DIAGNOSIS — R109 Unspecified abdominal pain: Secondary | ICD-10-CM | POA: Diagnosis not present

## 2017-11-09 DIAGNOSIS — N281 Cyst of kidney, acquired: Secondary | ICD-10-CM | POA: Diagnosis not present

## 2017-11-09 DIAGNOSIS — R110 Nausea: Secondary | ICD-10-CM | POA: Diagnosis not present

## 2017-11-09 DIAGNOSIS — F419 Anxiety disorder, unspecified: Secondary | ICD-10-CM | POA: Insufficient documentation

## 2017-11-09 DIAGNOSIS — I110 Hypertensive heart disease with heart failure: Secondary | ICD-10-CM | POA: Diagnosis not present

## 2017-11-09 LAB — CBC
HCT: 34.8 % — ABNORMAL LOW (ref 35.0–47.0)
Hemoglobin: 11.9 g/dL — ABNORMAL LOW (ref 12.0–16.0)
MCH: 31.2 pg (ref 26.0–34.0)
MCHC: 34.1 g/dL (ref 32.0–36.0)
MCV: 91.5 fL (ref 80.0–100.0)
Platelets: 263 10*3/uL (ref 150–440)
RBC: 3.81 MIL/uL (ref 3.80–5.20)
RDW: 12.8 % (ref 11.5–14.5)
WBC: 9.4 10*3/uL (ref 3.6–11.0)

## 2017-11-09 LAB — BASIC METABOLIC PANEL
Anion gap: 12 (ref 5–15)
BUN: 19 mg/dL (ref 6–20)
CO2: 20 mmol/L — ABNORMAL LOW (ref 22–32)
Calcium: 9.1 mg/dL (ref 8.9–10.3)
Chloride: 108 mmol/L (ref 101–111)
Creatinine, Ser: 0.98 mg/dL (ref 0.44–1.00)
GFR calc Af Amer: >60 mL/min (ref >60)
GFR calc non Af Amer: 56 mL/min — ABNORMAL LOW (ref >60)
Glucose, Bld: 120 mg/dL — ABNORMAL HIGH (ref 65–99)
Potassium: 3.8 mmol/L (ref 3.5–5.1)
Sodium: 140 mmol/L (ref 135–145)

## 2017-11-09 LAB — BRAIN NATRIURETIC PEPTIDE: B Natriuretic Peptide: 82 pg/mL (ref 0.0–100.0)

## 2017-11-09 LAB — TROPONIN I: Troponin I: <0.03 ng/mL (ref <0.03)

## 2017-11-09 NOTE — ED Triage Notes (Signed)
Patient c/o central chest pain radiating to stomach beginning last night. Patient reports accompanying symptoms of nausea, weakness, SOB, and light-headedness. Patient reports radiation to back. Patient reports her PCP told her to come to the ED to be evaluated for blood clot. Patient reports pain worsens with deep inspiration.

## 2017-11-10 ENCOUNTER — Emergency Department
Admission: EM | Admit: 2017-11-10 | Discharge: 2017-11-10 | Disposition: A | Discharge status: Home / Self Care | Payer: PPO | Attending: Emergency Medicine | Admitting: Emergency Medicine

## 2017-11-10 ENCOUNTER — Emergency Department: Payer: PPO

## 2017-11-10 DIAGNOSIS — I700 Atherosclerosis of aorta: Secondary | ICD-10-CM | POA: Diagnosis not present

## 2017-11-10 DIAGNOSIS — R0602 Shortness of breath: Secondary | ICD-10-CM | POA: Diagnosis not present

## 2017-11-10 DIAGNOSIS — N281 Cyst of kidney, acquired: Secondary | ICD-10-CM | POA: Diagnosis not present

## 2017-11-10 DIAGNOSIS — K7689 Other specified diseases of liver: Secondary | ICD-10-CM | POA: Diagnosis not present

## 2017-11-10 DIAGNOSIS — R0781 Pleurodynia: Secondary | ICD-10-CM

## 2017-11-10 LAB — HEPATIC FUNCTION PANEL
ALT: 21 U/L (ref 14–54)
AST: 29 U/L (ref 15–41)
Albumin: 3.8 g/dL (ref 3.5–5.0)
Alkaline Phosphatase: 84 U/L (ref 38–126)
Bilirubin, Direct: <0.1 mg/dL — ABNORMAL LOW (ref 0.1–0.5)
Total Bilirubin: 0.4 mg/dL (ref 0.3–1.2)
Total Protein: 7 g/dL (ref 6.5–8.1)

## 2017-11-10 LAB — LIPASE, BLOOD: Lipase: 46 U/L (ref 11–51)

## 2017-11-10 MED ORDER — ONDANSETRON 4 MG PO TBDP: 4.0000 mg | ORAL_TABLET | Freq: Three times a day (TID) | ORAL | 0 refills | Status: DC | PRN

## 2017-11-10 MED ORDER — ONDANSETRON HCL 4 MG/2ML IJ SOLN
4.0000 mg | Freq: Once | INTRAMUSCULAR | Status: AC
  Administered 2017-11-10: 4 mg via INTRAVENOUS
  Filled 2017-11-10: qty 2

## 2017-11-10 MED ORDER — MORPHINE SULFATE (PF) 4 MG/ML IV SOLN
4.0000 mg | Freq: Once | INTRAVENOUS | Status: AC
  Administered 2017-11-10: 4 mg via INTRAVENOUS
  Filled 2017-11-10: qty 1

## 2017-11-10 MED ORDER — IOPAMIDOL (ISOVUE-370) INJECTION 76%
100.0000 mL | Freq: Once | INTRAVENOUS | Status: AC | PRN
  Administered 2017-11-10: 100 mL via INTRAVENOUS

## 2017-11-10 MED ORDER — HYDROCODONE-ACETAMINOPHEN 5-325 MG PO TABS: 1.0000 | ORAL_TABLET | Freq: Four times a day (QID) | ORAL | 0 refills | Status: DC | PRN

## 2017-11-10 MED ORDER — KETOROLAC TROMETHAMINE 30 MG/ML IJ SOLN
15.0000 mg | Freq: Once | INTRAMUSCULAR | Status: AC
  Administered 2017-11-10: 15 mg via INTRAVENOUS
  Filled 2017-11-10: qty 1

## 2017-11-10 MED ORDER — ONDANSETRON HCL 4 MG/2ML IJ SOLN
4.0000 mg | Freq: Once | INTRAMUSCULAR | Status: AC
  Administered 2017-11-10: 4 mg via INTRAVENOUS

## 2017-11-10 MED ORDER — ONDANSETRON HCL 4 MG/2ML IJ SOLN
INTRAMUSCULAR | Status: AC
  Filled 2017-11-10: qty 2

## 2017-11-10 NOTE — ED Provider Notes (Signed)
[facility] Emergency Department Provider Note  ____________________________________________   First MD Initiated Contact with Patient 11/10/17 [phone]     (approximate)  I have reviewed the triage vital signs and the nursing notes.   HISTORY  Chief Complaint Chest Pain   HPI Jessica Armstrong is a 72 y.o. female self presents to the emergency department with roughly 24 hours of sharp aching right sided chest pain radiating around from her right back to her right upper quadrant.  It is associated with some shortness of breath.  Also reports some nausea.  Symptoms are nonexertional.  She has had no leg swelling.  She called her primary care physician who was concerned she might have a "blood clot" so advised her to come to the hospital.  Her symptoms were sudden onset.  They have been constant ever since.  Are nonexertional.  Nothing in particular seems to make them better or worse.  Past Medical History:  Diagnosis Date  . Anemia    mild; no medical treatment  . Arthritis   . Carotid artery stenosis    a. doppler 03/6062: RICA 01-60%, LICA 1-09% f/u 1 year  . Cervical spondyloarthritis   . Chronic diastolic CHF (congestive heart failure) ([place])    a. echo 04/2015: EF 55-60%, no RWMA, mitral valve w/ systolic bowing w/o prolapse, PASP 53 mmHg  . Claudication ([place])   . Colon polyp   . Cystitis   . Depression    situational  . Dyspnea   . GERD (gastroesophageal reflux disease)   . H/O vertigo   . History of measles   . History of mumps   . Hypercholesterolemia   . Hyperglycemia   . Hypertension   . Need for SBE (subacute bacterial endocarditis) prophylaxis   . Neuropathy   . Palpitations   . PONV (postoperative nausea and vomiting)    severe n/v after anesthesia  . Post-menopausal   . Pulmonary valve dysplasia   . Rotator cuff tear, right   . Seasonal allergies   . TMJ (dislocation of temporomandibular joint)   . Tricuspid regurgitation    a. echo  04/2013: EF 55-60%, nl wall motion, mild to mod TR, PASP 35 mm Hg    Patient Active Problem List   Diagnosis Date Noted  . Dizziness 06/30/2016  . Carotid artery stenosis   . Chronic diastolic CHF (congestive heart failure) ([place])   . Fatigue 04/19/2015  . Anxiety 04/19/2015  . Allergic rhinitis 04/19/2015  . Arthritis 04/19/2015  . Disorder of carbohydrate transport and metabolism ([place]) 04/19/2015  . Diverticulitis 04/19/2015  . Acid reflux 04/19/2015  . Hormone replacement therapy (postmenopausal) 04/19/2015  . Hypercholesteremia 04/19/2015  . Blood glucose elevated 04/19/2015  . Neuropathy 04/19/2015  . Pericarditis 04/19/2015  . Common wart 04/19/2015  . Hot flashes 06/10/2013  . Hemoptysis 04/21/2013  . Chest pain 04/08/2013  . SOB (shortness of breath) 04/08/2013  . Cough 04/08/2013  . Murmur 04/08/2013  . Essential hypertension 04/08/2013    Past Surgical History:  Procedure Laterality Date  . ABDOMINAL HYSTERECTOMY    . ANTERIOR CERVICAL DECOMP/DISCECTOMY FUSION N/A 08/26/2013   Procedure: CERVICAL SIX TO SEVEN ANTERIOR CERVICAL DECOMPRESSION/DISCECTOMY FUSION 1 LEVEL;  Surgeon: Erline Levine, MD;  Location: [place] NEURO ORS;  Service: Neurosurgery;  Laterality: N/A;  C6-7 Anterior cervical decompression/diskectomy/fusion  . BREAST BIOPSY Right    neg  . CHOLECYSTECTOMY    . COLONOSCOPY WITH PROPOFOL N/A 01/14/2016   Procedure: COLONOSCOPY WITH PROPOFOL;  Surgeon:  Manya Silvas, MD;  Location: [facility] ENDOSCOPY;  Service: Endoscopy;  Laterality: N/A;  . ESOPHAGOGASTRODUODENOSCOPY (EGD) WITH PROPOFOL N/A 01/14/2016   Procedure: ESOPHAGOGASTRODUODENOSCOPY (EGD) WITH PROPOFOL;  Surgeon: Manya Silvas, MD;  Location: [facility] ENDOSCOPY;  Service: Endoscopy;  Laterality: N/A;  . EYE SURGERY     benign tumor of eye  . SHOULDER ARTHROSCOPY WITH ROTATOR CUFF REPAIR AND SUBACROMIAL DECOMPRESSION Right 05/31/2017   Procedure: SHOULDER ARTHROSCOPY WITH SUBACROMIAL DECOMPRESSION AND  DISTAL CLAVICAL EXCISION;  Surgeon: Tania Ade, MD;  Location: [place];  Service: Orthopedics;  Laterality: Right;  SHOULDER ARTHROSCOPY WITH SUBACROMIAL DECOMPRESSION AND DISTAL CLAVICAL EXCISION    Prior to Admission medications   Medication Sig Start Date End Date Taking? Authorizing Provider  albuterol (PROAIR HFA) 108 (90 Base) MCG/ACT inhaler Inhale 1 puff into the lungs every 4 (four) hours as needed for wheezing or shortness of breath. 04/20/16  Yes Chrismon, Vickki Muff, PA  Cinnamon 500 MG capsule Take 500 mg by mouth daily.   Yes [provider]  diazepam (VALIUM) 2 MG tablet Take 1 tablet (2 mg total) by mouth every 12 (twelve) hours as needed for anxiety. Patient taking differently: Take 2 mg by mouth every 12 (twelve) hours as needed for anxiety (TMJ (JAW PAIN)).  12/19/16  Yes Jerrol Banana., MD  hydrALAZINE (APRESOLINE) 50 MG tablet TAKE ONE TABLET 3 TIMES DAILY 06/18/17  Yes Jerrol Banana., MD  loratadine (CLARITIN) 10 MG tablet Take 1 tablet (10 mg total) by mouth daily. 12/07/15  Yes Jerrol Banana., MD  meclizine (ANTIVERT) 25 MG tablet Take 1 tablet (25 mg total) by mouth daily as needed for dizziness. Patient taking differently: Take 25 mg by mouth 2 (two) times daily as needed for dizziness.  12/19/16  Yes Jerrol Banana., MD  metoprolol succinate (TOPROL-XL) 25 MG 24 hr tablet TAKE ONE TABLET BY MOUTH EVERY DAY 09/14/17  Yes Jerrol Banana., MD  Multiple Vitamin (MULTIVITAMIN) tablet Take 1 tablet by mouth daily.   Yes [provider]  pantoprazole (PROTONIX) 40 MG tablet TAKE ONE TABLET BY MOUTH TWICE DAILY Patient taking differently: TAKE ONE TABLET BY MOUTH DAILY 12/19/16  Yes Jerrol Banana., MD  PARoxetine (PAXIL) 20 MG tablet TAKE ONE TABLET EVERY DAY 06/18/17  Yes Jerrol Banana., MD  Red Yeast Rice Extract (RED YEAST RICE PO) Take 1 tablet by mouth daily.    Yes [provider]    triamterene-hydrochlorothiazide (DYAZIDE) 37.5-25 MG capsule TAKE 1 CAPSULE BY MOUTH EVERY DAY 09/18/17  Yes Jerrol Banana., MD  verapamil (CALAN-SR) 180 MG CR tablet TAKE ONE TABLET TWICE DAILY 04/09/17  Yes Jerrol Banana., MD  HYDROcodone-acetaminophen [facility]) 5-325 MG tablet Take 1 tablet by mouth every 6 (six) hours as needed for up to 7 doses for severe pain. 11/10/17   Darel Hong, MD  ondansetron (ZOFRAN ODT) 4 MG disintegrating tablet Take 1 tablet (4 mg total) by mouth every 8 (eight) hours as needed for nausea or vomiting. 11/10/17   Darel Hong, MD    Allergies Ciprofloxacin; Codeine sulfate; Keflex [cephalexin]; Macrobid [nitrofurantoin macrocrystal]; and Sulfa antibiotics  Family History  Problem Relation Age of Onset  . Heart disease Mother   . Hypertension Mother   . COPD Mother        was a smoker  . Hypertension Brother   . Colon cancer Father   . Asthma Maternal Grandfather   .  Breast cancer Neg Hx     Social History Social History   Tobacco Use  . Smoking status: Never Smoker  . Smokeless tobacco: Never Used  . 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 Alcohol use: Yes    Comment: rare use 1-2 drinks per year.   . Drug use: No    Review of Systems Constitutional: No fever/chills Eyes: No visual changes. ENT: No sore throat. Cardiovascular: Positive for chest pain. Respiratory: Positive for shortness of breath. Gastrointestinal: Positive for abdominal pain.  Positive for nausea, no vomiting.  No diarrhea.  No constipation. Genitourinary: Negative for dysuria. Musculoskeletal: Negative for back pain. Skin: Negative for rash. Neurological: Negative for headaches, focal weakness or numbness.   ____________________________________________   PHYSICAL EXAM:  VITAL SIGNS: ED Triage Vitals  Enc Vitals Group     BP 11/09/17 2131 (!) 147/82     Pulse Rate 11/09/17 2131 70     Resp  11/09/17 2131 18     Temp 11/09/17 2131 98.2 F (36.8 C)     Temp Source 11/09/17 2131 Oral     SpO2 11/09/17 2131 98 %     Weight 11/09/17 2132 160 lb (72.6 kg)     Height --      Head Circumference --      Peak Flow --      Pain Score 11/09/17 2131 8     Pain Loc --      Pain Edu? --      Excl. in [place]? --     Constitutional: Alert and oriented x4 somewhat uncomfortable nontoxic no diaphoresis speaks in full clear sentences Eyes: PERRL EOMI. Head: Atraumatic. Nose: No congestion/rhinnorhea. Mouth/Throat: No trismus Neck: No stridor.   Cardiovascular: Normal rate, regular rhythm. Grossly normal heart sounds.  Good peripheral circulation. Respiratory: Normal respiratory effort.  No retractions. Lungs CTAB and moving good air Gastrointestinal: Soft nondistended nontender no rebound or guarding no peritonitis Musculoskeletal: No lower extremity edema   Neurologic:  Normal speech and language. No gross focal neurologic deficits are appreciated. Skin:  Skin is warm, dry and intact. No rash noted. Psychiatric: Mood and affect are normal. Speech and behavior are normal.    ____________________________________________   DIFFERENTIAL includes but not limited to  Pulmonary embolism, acute coronary syndrome, cholecystitis, cholangitis, pancreatitis, pleurisy ____________________________________________   LABS (all labs ordered are listed, but only abnormal results are displayed)  Labs Reviewed  BASIC METABOLIC PANEL - Abnormal; Notable for the following components:      Result Value   CO2 20 (*)    Glucose, Bld 120 (*)    GFR calc non Af Amer 56 (*)    All other components within normal limits  CBC - Abnormal; Notable for the following components:   Hemoglobin 11.9 (*)    HCT 34.8 (*)    All other components within normal limits  HEPATIC FUNCTION PANEL - Abnormal; Notable for the following components:   Bilirubin, Direct <0.1 (*)    All other components within normal limits    TROPONIN I  BRAIN NATRIURETIC PEPTIDE  LIPASE, BLOOD    Lab work reviewed by me with no acute disease __________________________________________  EKG  ED ECG REPORT I, Darel Hong, the attending physician, personally viewed and interpreted this ECG.  Date: 11/09/2017 EKG Time:  Rate: 69 Rhythm: normal sinus rhythm QRS Axis: normal Intervals: normal ST/T Wave abnormalities: normal Narrative Interpretation: no evidence of acute ischemia  ____________________________________________  RADIOLOGY  CT angiogram reviewed by me with no evidence of pulmonary embolism ____________________________________________   PROCEDURES  Procedure(s) performed: o  Procedures  Critical Care performed: no  Observation: no ____________________________________________   INITIAL IMPRESSION / ASSESSMENT AND PLAN / ED COURSE  Pertinent labs & imaging results that were available during my care of the patient were reviewed by me and considered in my medical decision making (see chart for details).  On arrival the patient is uncomfortable appearing with right-sided chest pain that is mildly pleuritic.  EKG is nonischemic and first set of blood work unremarkable.  CT angiogram fortunately negative for acute pulmonary embolism.  I had a lengthy discussion with patient and her husband at bedside regarding the diagnostic uncertainty and the need for primary care follow-up.  2 troponins are negative.  Strict return precautions have been given and the patient verbalizes understanding agreement plan.      ____________________________________________   FINAL CLINICAL IMPRESSION(S) / ED DIAGNOSES  Final diagnoses:  Pleuritic chest pain      NEW MEDICATIONS STARTED DURING THIS VISIT:  Discharge Medication List as of 11/10/2017  6:09 AM    START taking these medications   Details  HYDROcodone-acetaminophen (NORCO) 5-325 MG tablet Take 1 tablet by mouth every 6 (six) hours as needed for up  to 7 doses for severe pain., Starting Sat 11/10/2017, Print         Note:  This document was prepared using Dragon voice recognition software and may include unintentional dictation errors.     Darel Hong, MD 11/11/17 [phone]

## 2017-11-10 NOTE — ED Notes (Signed)
Pt states has increased nausea. Will notify md, pt updated on progression of care plan.

## 2017-11-10 NOTE — Discharge Instructions (Signed)
Please take 400 mg of ibuprofen up to 3 times a day as needed for pain and use your Vicodin as needed for severe symptoms.  Follow-up with your primary care physician within 2 days for reevaluation and return to the emergency department sooner for any concerns.  It was a pleasure to take care of you today, and thank you for coming to our emergency department.  If you have any questions or concerns before leaving please ask the nurse to grab me and I'm more than happy to go through your aftercare instructions again.  If you were prescribed any opioid pain medication today such as Norco, Vicodin, Percocet, morphine, hydrocodone, or oxycodone please make sure you do not drive when you are taking this medication as it can alter your ability to drive safely.  If you have any concerns once you are home that you are not improving or are in fact getting worse before you can make it to your follow-up appointment, please do not hesitate to call 911 and come back for further evaluation.  Darel Hong, MD  Results for orders placed or performed during the hospital encounter of 53/97/67  Basic metabolic panel  Result Value Ref Range   Sodium 140 135 - 145 mmol/L   Potassium 3.8 3.5 - 5.1 mmol/L   Chloride 108 101 - 111 mmol/L   CO2 20 (L) 22 - 32 mmol/L   Glucose, Bld 120 (H) 65 - 99 mg/dL   BUN 19 6 - 20 mg/dL   Creatinine, Ser 0.98 0.44 - 1.00 mg/dL   Calcium 9.1 8.9 - 10.3 mg/dL   GFR calc non Af Amer 56 (L) >60 mL/min   GFR calc Af Amer >60 >60 mL/min   Anion gap 12 5 - 15  CBC  Result Value Ref Range   WBC 9.4 3.6 - 11.0 K/uL   RBC 3.81 3.80 - 5.20 MIL/uL   Hemoglobin 11.9 (L) 12.0 - 16.0 g/dL   HCT 34.8 (L) 35.0 - 47.0 %   MCV 91.5 80.0 - 100.0 fL   MCH 31.2 26.0 - 34.0 pg   MCHC 34.1 32.0 - 36.0 g/dL   RDW 12.8 11.5 - 14.5 %   Platelets 263 150 - 440 K/uL  Troponin I  Result Value Ref Range   Troponin I <0.03 <0.03 ng/mL  Brain natriuretic peptide  Result Value Ref Range   B  Natriuretic Peptide 82.0 0.0 - 100.0 pg/mL  Lipase, blood  Result Value Ref Range   Lipase 46 11 - 51 U/L  Hepatic function panel  Result Value Ref Range   Total Protein 7.0 6.5 - 8.1 g/dL   Albumin 3.8 3.5 - 5.0 g/dL   AST 29 15 - 41 U/L   ALT 21 14 - 54 U/L   Alkaline Phosphatase 84 38 - 126 U/L   Total Bilirubin 0.4 0.3 - 1.2 mg/dL   Bilirubin, Direct <0.1 (L) 0.1 - 0.5 mg/dL   Indirect Bilirubin NOT CALCULATED 0.3 - 0.9 mg/dL   Dg Chest 2 View  Result Date: 11/09/2017 CLINICAL DATA:  Central chest pain EXAM: CHEST  2 VIEW COMPARISON:  05/18/2014 FINDINGS: Heart and mediastinal contours are within normal limits. No focal opacities or effusions. No acute bony abnormality. IMPRESSION: No active cardiopulmonary disease. Electronically Signed   By: Rolm Baptise M.D.   On: 11/09/2017 21:58   Ct Angio Chest Pe W/cm &/or Wo Cm  Result Date: 11/10/2017 CLINICAL DATA:  Central chest pain radiating to the stomach beginning  last night. Nausea, weakness, shortness of breath, and lightheadedness. Pain with deep inspiration. EXAM: CT ANGIOGRAPHY CHEST CT ABDOMEN AND PELVIS WITH CONTRAST TECHNIQUE: Multidetector CT imaging of the chest was performed using the standard protocol during bolus administration of intravenous contrast. Multiplanar CT image reconstructions and MIPs were obtained to evaluate the vascular anatomy. Multidetector CT imaging of the abdomen and pelvis was performed using the standard protocol during bolus administration of intravenous contrast. CONTRAST:  190mL ISOVUE-370 IOPAMIDOL (ISOVUE-370) INJECTION 76% COMPARISON:  CT abdomen and pelvis 12/16/2015 FINDINGS: CTA CHEST FINDINGS Cardiovascular: Good opacification of the central and segmental pulmonary arteries. No focal filling defects. No evidence of significant pulmonary embolus. Normal caliber thoracic aorta with scattered calcifications. No dissection. Great vessel origins are patent. Normal heart size. No pericardial effusion.  Mediastinum/Nodes: No enlarged mediastinal, hilar, or axillary lymph nodes. Thyroid gland, trachea, and esophagus demonstrate no significant findings. Lungs/Pleura: Lungs are clear. No pleural effusion or pneumothorax. Musculoskeletal: Postoperative changes in the cervical spine. Mild degenerative changes in the thoracic spine. No destructive bone lesions. Review of the MIP images confirms the above findings. CT ABDOMEN and PELVIS FINDINGS Hepatobiliary: Several low-attenuation lesions, largest in the lateral segment left lobe measuring 1.2 cm diameter. These are likely cysts. Surgical absence of the gallbladder. No bile duct dilatation. Pancreas: Unremarkable. No pancreatic ductal dilatation or surrounding inflammatory changes. Spleen: Normal in size without focal abnormality. Adrenals/Urinary Tract: No adrenal gland nodules. Left renal cyst. Symmetrical nephrograms. No hydronephrosis or hydroureter. Bladder is unremarkable. Stomach/Bowel: Stomach and small bowel are decompressed. Normal caliber of the colon with scattered stool throughout. Diverticulosis of the sigmoid colon. No evidence of diverticulitis. Appendix is normal. Vascular/Lymphatic: Aortic atherosclerosis. No enlarged abdominal or pelvic lymph nodes. Reproductive: Status post hysterectomy. No adnexal masses. Other: No abdominal wall hernia or abnormality. No abdominopelvic ascites. Musculoskeletal: Mild degenerative changes in the spine. No destructive bone lesions. Review of the MIP images confirms the above findings. IMPRESSION: 1. No evidence of significant pulmonary embolus. 2. No evidence of active pulmonary disease. 3. Benign-appearing cysts in the liver and left kidney. 4. No evidence of bowel obstruction or inflammation. 5. Aortic atherosclerosis. 6. Degenerative changes in the spine. Electronically Signed   By: Lucienne Capers M.D.   On: 11/10/2017 04:45   Ct Abdomen Pelvis W Contrast  Result Date: 11/10/2017 CLINICAL DATA:  Central chest  pain radiating to the stomach beginning last night. Nausea, weakness, shortness of breath, and lightheadedness. Pain with deep inspiration. EXAM: CT ANGIOGRAPHY CHEST CT ABDOMEN AND PELVIS WITH CONTRAST TECHNIQUE: Multidetector CT imaging of the chest was performed using the standard protocol during bolus administration of intravenous contrast. Multiplanar CT image reconstructions and MIPs were obtained to evaluate the vascular anatomy. Multidetector CT imaging of the abdomen and pelvis was performed using the standard protocol during bolus administration of intravenous contrast. CONTRAST:  155mL ISOVUE-370 IOPAMIDOL (ISOVUE-370) INJECTION 76% COMPARISON:  CT abdomen and pelvis 12/16/2015 FINDINGS: CTA CHEST FINDINGS Cardiovascular: Good opacification of the central and segmental pulmonary arteries. No focal filling defects. No evidence of significant pulmonary embolus. Normal caliber thoracic aorta with scattered calcifications. No dissection. Great vessel origins are patent. Normal heart size. No pericardial effusion. Mediastinum/Nodes: No enlarged mediastinal, hilar, or axillary lymph nodes. Thyroid gland, trachea, and esophagus demonstrate no significant findings. Lungs/Pleura: Lungs are clear. No pleural effusion or pneumothorax. Musculoskeletal: Postoperative changes in the cervical spine. Mild degenerative changes in the thoracic spine. No destructive bone lesions. Review of the MIP images confirms the above findings. CT ABDOMEN  and PELVIS FINDINGS Hepatobiliary: Several low-attenuation lesions, largest in the lateral segment left lobe measuring 1.2 cm diameter. These are likely cysts. Surgical absence of the gallbladder. No bile duct dilatation. Pancreas: Unremarkable. No pancreatic ductal dilatation or surrounding inflammatory changes. Spleen: Normal in size without focal abnormality. Adrenals/Urinary Tract: No adrenal gland nodules. Left renal cyst. Symmetrical nephrograms. No hydronephrosis or  hydroureter. Bladder is unremarkable. Stomach/Bowel: Stomach and small bowel are decompressed. Normal caliber of the colon with scattered stool throughout. Diverticulosis of the sigmoid colon. No evidence of diverticulitis. Appendix is normal. Vascular/Lymphatic: Aortic atherosclerosis. No enlarged abdominal or pelvic lymph nodes. Reproductive: Status post hysterectomy. No adnexal masses. Other: No abdominal wall hernia or abnormality. No abdominopelvic ascites. Musculoskeletal: Mild degenerative changes in the spine. No destructive bone lesions. Review of the MIP images confirms the above findings. IMPRESSION: 1. No evidence of significant pulmonary embolus. 2. No evidence of active pulmonary disease. 3. Benign-appearing cysts in the liver and left kidney. 4. No evidence of bowel obstruction or inflammation. 5. Aortic atherosclerosis. 6. Degenerative changes in the spine. Electronically Signed   By: Lucienne Capers M.D.   On: 11/10/2017 04:45

## 2017-12-20 ENCOUNTER — Ambulatory Visit (INDEPENDENT_AMBULATORY_CARE_PROVIDER_SITE_OTHER): Payer: PPO | Admitting: Family Medicine

## 2017-12-20 ENCOUNTER — Encounter: Payer: Self-pay | Admitting: Family Medicine

## 2017-12-20 VITALS — BP 116/64 | HR 76 | Resp 16 | Ht 67.0 in | Wt 160.0 lb

## 2017-12-20 DIAGNOSIS — E7800 Pure hypercholesterolemia, unspecified: Secondary | ICD-10-CM | POA: Diagnosis not present

## 2017-12-20 DIAGNOSIS — R739 Hyperglycemia, unspecified: Secondary | ICD-10-CM | POA: Diagnosis not present

## 2017-12-20 DIAGNOSIS — E2839 Other primary ovarian failure: Secondary | ICD-10-CM | POA: Diagnosis not present

## 2017-12-20 DIAGNOSIS — I10 Essential (primary) hypertension: Secondary | ICD-10-CM

## 2017-12-20 NOTE — Progress Notes (Signed)
[name], Female    DOB: 1946/05/26, 72 y.o.   MRN: [id number] Visit Date: 12/20/2017  Today's Provider: Wilhemena Durie, MD   Chief Complaint  Patient presents with  . Medicare Wellness   Subjective:     Complete Physical AFTYN NOTT is a 72 y.o. female. She feels fairly well. She reports exercising regularly. She reports she is sleeping well.  -----------------------------------------------------------   Review of Systems  Constitutional: Negative.   HENT: Negative.   Eyes: Negative.   Respiratory: Negative.   Cardiovascular: Negative.   Gastrointestinal: Negative.   Endocrine: Negative.   Genitourinary: Negative.   Musculoskeletal: Negative.   Skin: Negative.   Allergic/Immunologic: Negative.   Neurological: Negative.        Pt states that with extreme exertion she gets lightheaded and sometimes" passes out."  Hematological: Negative.   Psychiatric/Behavioral: Negative.     Social History   Socioeconomic History  . Marital status: Married    Spouse name: Not on file  . Number of children: Not on file  . Years of education: Not on file  . Highest education level: Not on file  Social Needs  . Financial resource strain: Not on file  . Food insecurity - worry: Not on file  . Food insecurity - inability: Not on file  . Transportation needs - medical: Not on file  . Transportation needs - non-medical: Not on file  Occupational History  . Not on file  Tobacco Use  . Smoking status: Never Smoker  . Smokeless tobacco: Never Used  . 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 Alcohol use: Yes    Comment: rare use 1-2 drinks per year.   . Drug use: No  . Sexual activity: Not on file  Other Topics Concern  . Not on file  Social History Narrative  . Not on file    Past Medical History:  Diagnosis Date  . Anemia    mild; no medical treatment  . Arthritis   . Carotid  artery stenosis    a. doppler 06/7672: RICA 41-93%, LICA 7-90% f/u 1 year  . Cervical spondyloarthritis   . Chronic diastolic CHF (congestive heart failure) ([place])    a. echo 04/2015: EF 55-60%, no RWMA, mitral valve w/ systolic bowing w/o prolapse, PASP 53 mmHg  . Claudication ([place])   . Colon polyp   . Cystitis   . Depression    situational  . Dyspnea   . GERD (gastroesophageal reflux disease)   . H/O vertigo   . History of measles   . History of mumps   . Hypercholesterolemia   . Hyperglycemia   . Hypertension   . Need for SBE (subacute bacterial endocarditis) prophylaxis   . Neuropathy   . Palpitations   . PONV (postoperative nausea and vomiting)    severe n/v after anesthesia  . Post-menopausal   . Pulmonary valve dysplasia   . Rotator cuff tear, right   . Seasonal allergies   . TMJ (dislocation of temporomandibular joint)   . Tricuspid regurgitation    a. echo 04/2013: EF 55-60%, nl wall motion, mild to mod TR, PASP 35 mm Hg     Patient Active Problem List   Diagnosis Date Noted  . Dizziness 06/30/2016  . Carotid artery stenosis   . Chronic diastolic CHF (congestive heart failure) ([place])   . Fatigue 04/19/2015  . Anxiety 04/19/2015  .  Allergic rhinitis 04/19/2015  . Arthritis 04/19/2015  . Disorder of carbohydrate transport and metabolism ([place]) 04/19/2015  . Diverticulitis 04/19/2015  . Acid reflux 04/19/2015  . Hormone replacement therapy (postmenopausal) 04/19/2015  . Hypercholesteremia 04/19/2015  . Blood glucose elevated 04/19/2015  . Neuropathy 04/19/2015  . Pericarditis 04/19/2015  . Common wart 04/19/2015  . Hot flashes 06/10/2013  . Hemoptysis 04/21/2013  . Chest pain 04/08/2013  . SOB (shortness of breath) 04/08/2013  . Cough 04/08/2013  . Murmur 04/08/2013  . Essential hypertension 04/08/2013    Past Surgical History:  Procedure Laterality Date  . ABDOMINAL HYSTERECTOMY    . ANTERIOR CERVICAL DECOMP/DISCECTOMY FUSION N/A 08/26/2013   Procedure:  CERVICAL SIX TO SEVEN ANTERIOR CERVICAL DECOMPRESSION/DISCECTOMY FUSION 1 LEVEL;  Surgeon: Erline Levine, MD;  Location: [facility];  Service: Neurosurgery;  Laterality: N/A;  C6-7 Anterior cervical decompression/diskectomy/fusion  . BREAST BIOPSY Right    neg  . CHOLECYSTECTOMY    . COLONOSCOPY WITH PROPOFOL N/A 01/14/2016   Procedure: COLONOSCOPY WITH PROPOFOL;  Surgeon: Manya Silvas, MD;  Location: [facility] END[facility]COPY;  Service: Endoscopy;  Laterality: N/A;  . ESOPHAGOGASTRODUODENOSCOPY (EGD) WITH PROPOFOL N/A 01/14/2016   Procedure: ESOPHAGOGASTRODUODENOSCOPY (EGD) WITH PROPOFOL;  Surgeon: Manya Silvas, MD;  Location: [facility] ENDOSCOPY;  Service: Endoscopy;  Laterality: N/A;  . EYE SURGERY     benign tumor of eye  . SHOULDER ARTHROSCOPY WITH ROTATOR CUFF REPAIR AND SUBACROMIAL DECOMPRESSION Right 05/31/2017   Procedure: SHOULDER ARTHROSCOPY WITH SUBACROMIAL DECOMPRESSION AND DISTAL CLAVICAL EXCISION;  Surgeon: Tania Ade, MD;  Location: [facility];  Service: Orthopedics;  Laterality: Right;  SHOULDER ARTHROSCOPY WITH SUBACROMIAL DECOMPRESSION AND DISTAL CLAVICAL EXCISION    Her family history includes Asthma in her maternal grandfather; COPD in her mother; Colon cancer in her father; Heart disease in her mother; Hypertension in her brother and mother. There is no history of Breast cancer.      Current Outpatient Medications:  .  albuterol (PROAIR HFA) 108 (90 Base) MCG/ACT inhaler, Inhale 1 puff into the lungs every 4 (four) hours as needed for wheezing or shortness of breath., Disp: 3 Inhaler, Rfl: 3 .  Cinnamon 500 MG capsule, Take 500 mg by mouth daily., Disp: , Rfl:  .  diazepam (VALIUM) 2 MG tablet, Take 1 tablet (2 mg total) by mouth every 12 (twelve) hours as needed for anxiety. (Patient taking differently: Take 2 mg by mouth every 12 (twelve) hours as needed for anxiety (TMJ (JAW PAIN)). ), Disp: 60 tablet, Rfl: 0 .  hydrALAZINE (APRESOLINE) 50 MG tablet, TAKE ONE TABLET 3 TIMES DAILY,  Disp: 270 tablet, Rfl: 3 .  HYDROcodone-acetaminophen (NORCO) 5-325 MG tablet, Take 1 tablet by mouth every 6 (six) hours as needed for up to 7 doses for severe pain., Disp: 7 tablet, Rfl: 0 .  loratadine (CLARITIN) 10 MG tablet, Take 1 tablet (10 mg total) by mouth daily., Disp: 90 tablet, Rfl: 3 .  meclizine (ANTIVERT) 25 MG tablet, Take 1 tablet (25 mg total) by mouth daily as needed for dizziness. (Patient taking differently: Take 25 mg by mouth 2 (two) times daily as needed for dizziness. ), Disp: 90 tablet, Rfl: 3 .  metoprolol succinate (TOPROL-XL) 25 MG 24 hr tablet, TAKE ONE TABLET BY MOUTH EVERY DAY, Disp: 90 tablet, Rfl: 3 .  Multiple Vitamin (MULTIVITAMIN) tablet, Take 1 tablet by mouth daily., Disp: , Rfl:  .  ondansetron (ZOFRAN ODT) 4 MG disintegrating tablet, Take 1 tablet (4 mg total) by mouth every 8 (  eight) hours as needed for nausea or vomiting., Disp: 20 tablet, Rfl: 0 .  pantoprazole (PROTONIX) 40 MG tablet, TAKE ONE TABLET BY MOUTH TWICE DAILY (Patient taking differently: TAKE ONE TABLET BY MOUTH DAILY), Disp: 180 tablet, Rfl: 3 .  PARoxetine (PAXIL) 20 MG tablet, TAKE ONE TABLET EVERY DAY, Disp: 90 tablet, Rfl: 3 .  Red Yeast Rice Extract (RED YEAST RICE PO), Take 1 tablet by mouth daily. , Disp: , Rfl:  .  triamterene-hydrochlorothiazide (DYAZIDE) 37.5-25 MG capsule, TAKE 1 CAPSULE BY MOUTH EVERY DAY, Disp: 90 capsule, Rfl: 3 .  verapamil (CALAN-SR) 180 MG CR tablet, TAKE ONE TABLET TWICE DAILY, Disp: 180 tablet, Rfl: 3  Patient Care Team: Jerrol Banana., MD as PCP - General (Family Medicine)     Objective:   Vitals: BP 116/64 (BP Location: Right Arm, Patient Position: Sitting, Cuff Size: Normal)   Pulse 76   Resp 16   Ht 5\' 7"  (1.702 m)   Wt 160 lb (72.6 kg)   BMI 25.06 kg/m   Physical Exam  Activities of Daily Living In your present state of health, do you have any difficulty performing the following activities: 05/21/2017  Hearing? N  Vision? N    Difficulty concentrating or making decisions? N  Walking or climbing stairs? Y  Some recent data might be hidden    Fall Risk Assessment Fall Risk  12/19/2016 12/07/2015 06/15/2015  Falls in the past year? No No No     Depression Screen PHQ 2/9 Scores 12/19/2016 12/07/2015 06/15/2015  PHQ - 2 Score 0 0 0  PHQ- 9 Score 2 - -    Cognitive Testing - 6-CIT  Correct? Score   What year is it? yes 0 0 or 4  What month is it? yes 0 0 or 3  Memorize:    Pia Mau,  40,  [address],  [place],      What time is it? (within 1 hour) yes 0 0 or 3  Count backwards from 20 yes 0 0, 2, or 4  Name the months of the year yes 0 0, 2, or 4  Repeat name & address above no 1 0, 2, 4, 6, 8, or 10       TOTAL SCORE  1/28   Interpretation:  Normal  Normal (0-7) Abnormal (8-28)       Assessment & Plan:    Annual Physical Reviewed patient's Family Medical History Reviewed and updated list of patient's medical providers Assessment of cognitive impairment was done Assessed patient's functional ability Established a written schedule for health screening [place] Completed and Reviewed  Exercise Activities and Dietary recommendations Goals    None      Immunization History  Administered Date(s) Administered  . Influenza Whole 07/09/2012  . Influenza, High Dose Seasonal PF 07/11/2016, 08/06/2017  . Influenza-Unspecified 09/10/2015  . Pneumococcal Conjugate-13 12/07/2015  . Pneumococcal Polysaccharide-23 11/23/2011  . Zoster 08/06/2013    Health Maintenance  Topic Date Due  . Hepatitis C Screening  Jan 30, 1946  . TETANUS/TDAP  06/30/1963  . DEXA SCAN  06/29/2009  . MAMMOGRAM  02/24/2019  . COLONOSCOPY  01/13/2026  . INFLUENZA VACCINE  Completed  . PNA vac Low Risk Adult  Completed     Discussed health benefits of physical activity, and encouraged her to engage in regular exercise appropriate for her age and condition.  HTN GERD GAD HLD Carotid  Stenosis Encourage f/u with Dr Saralyn Pilar.  I have done the  exam and reviewed the chart and it is accurate to the best of my knowledge. Development worker, community has been used and  any errors in dictation or transcription are unintentional. Miguel Aschoff M.D. [facility]    ------------------------------------------------------------------------------------------------------------    Wilhemena Durie, MD  [facility]

## 2017-12-21 LAB — COMPREHENSIVE METABOLIC PANEL
ALT: 16 IU/L (ref 0–32)
AST: 19 IU/L (ref 0–40)
Albumin/Globulin Ratio: 1.9 (ref 1.2–2.2)
Albumin: 4.6 g/dL (ref 3.5–4.8)
Alkaline Phosphatase: 97 IU/L (ref 39–117)
BUN/Creatinine Ratio: 20 (ref 12–28)
BUN: 23 mg/dL (ref 8–27)
Bilirubin Total: 0.3 mg/dL (ref 0.0–1.2)
CO2: 22 mmol/L (ref 20–29)
Calcium: 9.8 mg/dL (ref 8.7–10.3)
Chloride: 103 mmol/L (ref 96–106)
Creatinine, Ser: 1.15 mg/dL — ABNORMAL HIGH (ref 0.57–1.00)
GFR calc Af Amer: 55 mL/min/{1.73_m2} — ABNORMAL LOW (ref >59)
GFR calc non Af Amer: 47 mL/min/{1.73_m2} — ABNORMAL LOW (ref >59)
Globulin, Total: 2.4 g/dL (ref 1.5–4.5)
Glucose: 120 mg/dL — ABNORMAL HIGH (ref 65–99)
Potassium: 4.8 mmol/L (ref 3.5–5.2)
Sodium: 142 mmol/L (ref 134–144)
Total Protein: 7 g/dL (ref 6.0–8.5)

## 2017-12-21 LAB — CBC WITH DIFFERENTIAL/PLATELET
Basophils Absolute: 0 10*3/uL (ref 0.0–0.2)
Basos: 0 %
EOS (ABSOLUTE): 0.2 10*3/uL (ref 0.0–0.4)
Eos: 3 %
Hematocrit: 36.2 % (ref 34.0–46.6)
Hemoglobin: 11.8 g/dL (ref 11.1–15.9)
Immature Grans (Abs): 0 10*3/uL (ref 0.0–0.1)
Immature Granulocytes: 0 %
Lymphocytes Absolute: 2 10*3/uL (ref 0.7–3.1)
Lymphs: 28 %
MCH: 30.3 pg (ref 26.6–33.0)
MCHC: 32.6 g/dL (ref 31.5–35.7)
MCV: 93 fL (ref 79–97)
Monocytes Absolute: 0.8 10*3/uL (ref 0.1–0.9)
Monocytes: 11 %
Neutrophils Absolute: 4.2 10*3/uL (ref 1.4–7.0)
Neutrophils: 58 %
Platelets: 288 10*3/uL (ref 150–379)
RBC: 3.89 x10E6/uL (ref 3.77–5.28)
RDW: 13.6 % (ref 12.3–15.4)
WBC: 7.3 10*3/uL (ref 3.4–10.8)

## 2017-12-21 LAB — LIPID PANEL
Chol/HDL Ratio: 4 ratio (ref 0.0–4.4)
Cholesterol, Total: 197 mg/dL (ref 100–199)
HDL: 49 mg/dL (ref >39)
LDL Calculated: 99 mg/dL (ref 0–99)
Triglycerides: 243 mg/dL — ABNORMAL HIGH (ref 0–149)
VLDL Cholesterol Cal: 49 mg/dL — ABNORMAL HIGH (ref 5–40)

## 2017-12-21 LAB — HEMOGLOBIN A1C
Est. average glucose Bld gHb Est-mCnc: 123 mg/dL
Hgb A1c MFr Bld: 5.9 % — ABNORMAL HIGH (ref 4.8–5.6)

## 2017-12-21 LAB — TSH: TSH: 5.26 u[IU]/mL — ABNORMAL HIGH (ref 0.450–4.500)

## 2018-01-14 DIAGNOSIS — I10 Essential (primary) hypertension: Secondary | ICD-10-CM | POA: Diagnosis not present

## 2018-01-14 DIAGNOSIS — I5032 Chronic diastolic (congestive) heart failure: Secondary | ICD-10-CM | POA: Diagnosis not present

## 2018-01-14 DIAGNOSIS — R0602 Shortness of breath: Secondary | ICD-10-CM | POA: Diagnosis not present

## 2018-01-14 DIAGNOSIS — E7800 Pure hypercholesterolemia, unspecified: Secondary | ICD-10-CM | POA: Diagnosis not present

## 2018-01-22 ENCOUNTER — Other Ambulatory Visit: Payer: PPO

## 2018-01-24 ENCOUNTER — Ambulatory Visit
Admission: RE | Admit: 2018-01-24 | Discharge: 2018-01-24 | Disposition: A | Discharge status: Home / Self Care | Payer: PPO | Source: Ambulatory Visit | Attending: Family Medicine | Admitting: Family Medicine

## 2018-01-24 DIAGNOSIS — M85852 Other specified disorders of bone density and structure, left thigh: Secondary | ICD-10-CM | POA: Insufficient documentation

## 2018-01-24 DIAGNOSIS — Z780 Asymptomatic menopausal state: Secondary | ICD-10-CM | POA: Diagnosis not present

## 2018-01-24 DIAGNOSIS — E2839 Other primary ovarian failure: Secondary | ICD-10-CM | POA: Insufficient documentation

## 2018-01-29 ENCOUNTER — Telehealth: Payer: Self-pay | Admitting: Emergency Medicine

## 2018-01-29 NOTE — Telephone Encounter (Signed)
error 

## 2018-01-30 ENCOUNTER — Other Ambulatory Visit: Payer: Self-pay | Admitting: Family Medicine

## 2018-01-30 ENCOUNTER — Telehealth: Payer: Self-pay | Admitting: Family Medicine

## 2018-01-30 NOTE — Telephone Encounter (Signed)
Dr. Rosanna Randy sent in Amoxicillin for pt. Pt husband advised.

## 2018-01-30 NOTE — Telephone Encounter (Signed)
Please advise 

## 2018-01-30 NOTE — Telephone Encounter (Signed)
Pt just found out she is having to see her dentist at 3:40 today.   She has to take antibiotic one hr before her appt when she sees the dentist  She uses [facility]  Her call back is [phone] if you need to call back  teri

## 2018-02-19 ENCOUNTER — Other Ambulatory Visit: Payer: Self-pay | Admitting: Family Medicine

## 2018-02-19 DIAGNOSIS — Z1231 Encounter for screening mammogram for malignant neoplasm of breast: Secondary | ICD-10-CM

## 2018-03-11 ENCOUNTER — Ambulatory Visit
Admission: RE | Admit: 2018-03-11 | Discharge: 2018-03-11 | Disposition: A | Discharge status: Home / Self Care | Payer: PPO | Source: Ambulatory Visit | Attending: Family Medicine | Admitting: Family Medicine

## 2018-03-11 DIAGNOSIS — Z1231 Encounter for screening mammogram for malignant neoplasm of breast: Secondary | ICD-10-CM | POA: Insufficient documentation

## 2018-03-12 DIAGNOSIS — M9904 Segmental and somatic dysfunction of sacral region: Secondary | ICD-10-CM | POA: Diagnosis not present

## 2018-03-12 DIAGNOSIS — M533 Sacrococcygeal disorders, not elsewhere classified: Secondary | ICD-10-CM | POA: Diagnosis not present

## 2018-03-12 DIAGNOSIS — M5489 Other dorsalgia: Secondary | ICD-10-CM | POA: Diagnosis not present

## 2018-03-22 ENCOUNTER — Other Ambulatory Visit: Payer: Self-pay | Admitting: Family Medicine

## 2018-03-22 DIAGNOSIS — R42 Dizziness and giddiness: Secondary | ICD-10-CM

## 2018-03-22 NOTE — Telephone Encounter (Signed)
Pt called asking for a refill on her meclizine 25 mg  [facility]

## 2018-06-09 ENCOUNTER — Other Ambulatory Visit: Payer: Self-pay | Admitting: Family Medicine

## 2018-07-16 DIAGNOSIS — E7800 Pure hypercholesterolemia, unspecified: Secondary | ICD-10-CM | POA: Diagnosis not present

## 2018-07-16 DIAGNOSIS — R0602 Shortness of breath: Secondary | ICD-10-CM | POA: Diagnosis not present

## 2018-07-16 DIAGNOSIS — I6529 Occlusion and stenosis of unspecified carotid artery: Secondary | ICD-10-CM | POA: Diagnosis not present

## 2018-07-16 DIAGNOSIS — I5032 Chronic diastolic (congestive) heart failure: Secondary | ICD-10-CM | POA: Diagnosis not present

## 2018-08-12 ENCOUNTER — Other Ambulatory Visit: Payer: Self-pay | Admitting: Family Medicine

## 2018-08-22 ENCOUNTER — Other Ambulatory Visit: Payer: Self-pay | Admitting: Family Medicine

## 2018-08-22 DIAGNOSIS — J40 Bronchitis, not specified as acute or chronic: Secondary | ICD-10-CM

## 2018-08-22 MED ORDER — ALBUTEROL SULFATE HFA 108 (90 BASE) MCG/ACT IN AERS
1.0000 | INHALATION_SPRAY | RESPIRATORY_TRACT | 3 refills | Status: DC | PRN
Start: 2018-08-22 — End: 2019-10-16

## 2018-08-22 NOTE — Telephone Encounter (Signed)
Pt contacted office for refill request on the following medications:  albuterol (PROAIR HFA) 108 (90 Base) MCG/ACT inhaler   Pt asked for the generic to be sent to [facility]. Please advise. Thanks TNP

## 2018-09-09 ENCOUNTER — Other Ambulatory Visit: Payer: Self-pay | Admitting: Family Medicine

## 2018-10-16 ENCOUNTER — Encounter: Payer: Self-pay | Admitting: Family Medicine

## 2018-10-16 ENCOUNTER — Ambulatory Visit (INDEPENDENT_AMBULATORY_CARE_PROVIDER_SITE_OTHER): Payer: PPO | Admitting: Family Medicine

## 2018-10-16 VITALS — BP 138/60 | HR 62 | Temp 97.7°F | Resp 16 | Wt 151.4 lb

## 2018-10-16 DIAGNOSIS — F419 Anxiety disorder, unspecified: Secondary | ICD-10-CM

## 2018-10-16 DIAGNOSIS — I10 Essential (primary) hypertension: Secondary | ICD-10-CM | POA: Diagnosis not present

## 2018-10-16 DIAGNOSIS — S0300XS Dislocation of jaw, unspecified side, sequela: Secondary | ICD-10-CM

## 2018-10-16 DIAGNOSIS — E749 Disorder of carbohydrate metabolism, unspecified: Secondary | ICD-10-CM

## 2018-10-16 DIAGNOSIS — R197 Diarrhea, unspecified: Secondary | ICD-10-CM | POA: Diagnosis not present

## 2018-10-16 MED ORDER — CHOLESTYRAMINE 4 GM/DOSE PO POWD: 4.0000 g | Freq: Every day | ORAL | 12 refills | Status: DC

## 2018-10-16 MED ORDER — DIAZEPAM 2 MG PO TABS: 2.0000 mg | ORAL_TABLET | Freq: Two times a day (BID) | ORAL | 0 refills | Status: DC | PRN

## 2018-10-16 NOTE — Progress Notes (Signed)
[name] Female    DOB: 09/10/1946   73 y.o.   MRN: [id number] Visit Date: 10/16/2018  Today's Provider: Wilhemena Durie, MD   Chief Complaint  Patient presents with  . Diarrhea   Subjective:     Diarrhea   This is a chronic problem. The current episode started more than 1 month ago (Reports that for the past 2 months and is worsening). The problem has been gradually worsening. The stool consistency is described as watery. The patient states that diarrhea does not awaken her from sleep. Associated symptoms include abdominal pain ("some"), sweats and weight loss (She reports that she has cut her sugar intake). Pertinent negatives include no bloating or fever. Associated symptoms comments: Reports that for the past few weeks she gets the diarrhea 20 minutes after eating.. Nothing aggravates the symptoms. She has tried nothing for the symptoms.   Patient last Colonoscopy was 01/14/16 polyps.   Patient also complaining about her tailbone still hurting. This is a chronic issues.  Patient also requesting refill on her Diazepam 2 mg.  Allergies  Allergen Reactions  . Ciprofloxacin Nausea And Vomiting  . Codeine Sulfate Nausea And Vomiting  . Keflex [Cephalexin] Nausea And Vomiting  . Macrobid [Nitrofurantoin Macrocrystal] Nausea And Vomiting  . Sulfa Antibiotics Nausea And Vomiting     Current Outpatient Medications:  .  albuterol (PROAIR HFA) 108 (90 Base) MCG/ACT inhaler, Inhale 1 puff into the lungs every 4 (four) hours as needed for wheezing or shortness of breath., Disp: 3 Inhaler, Rfl: 3 .  diazepam (VALIUM) 2 MG tablet, Take 1 tablet (2 mg total) by mouth every 12 (twelve) hours as needed for anxiety. (Patient taking differently: Take 2 mg by mouth every 12 (twelve) hours as needed for anxiety (TMJ (JAW PAIN)). ), Disp: 60 tablet, Rfl: 0 .  hydrALAZINE (APRESOLINE) 50 MG tablet, TAKE ONE TABLET 3 TIMES DAILY, Disp: 270 tablet, Rfl: 3 .  meclizine (ANTIVERT)  25 MG tablet, Take 1 tablet (25 mg total) by mouth 2 (two) times daily as needed for dizziness., Disp: 60 tablet, Rfl: 2 .  metoprolol succinate (TOPROL-XL) 25 MG 24 hr tablet, TAKE ONE TABLET BY MOUTH EVERY DAY, Disp: 90 tablet, Rfl: 3 .  pantoprazole (PROTONIX) 40 MG tablet, TAKE 1 TABLET BY MOUTH TWICE DAILY, Disp: 180 tablet, Rfl: 3 .  PARoxetine (PAXIL) 20 MG tablet, TAKE ONE TABLET EVERY DAY, Disp: 90 tablet, Rfl: 3 .  triamterene-hydrochlorothiazide (DYAZIDE) 37.5-25 MG capsule, TAKE 1 CAPSULE BY MOUTH EVERY DAY, Disp: 90 capsule, Rfl: 3 .  verapamil (CALAN-SR) 180 MG CR tablet, TAKE ONE TABLET BY MOUTH TWICE DAILY, Disp: 180 tablet, Rfl: 3 .  amoxicillin (AMOXIL) 500 MG capsule, TAKE 4 CAPSULES ONE HOUR PRIOR TO DENTALPROCEDURE (Patient not taking: Reported on 10/16/2018), Disp: 4 capsule, Rfl: 3 .  Cinnamon 500 MG capsule, Take 500 mg by mouth daily., Disp: , Rfl:  .  HYDROcodone-acetaminophen (NORCO) 5-325 MG tablet, Take 1 tablet by mouth every 6 (six) hours as needed for up to 7 doses for severe pain. (Patient not taking: Reported on 10/16/2018), Disp: 7 tablet, Rfl: 0 .  loratadine (CLARITIN) 10 MG tablet, Take 1 tablet (10 mg total) by mouth daily. (Patient not taking: Reported on 10/16/2018), Disp: 90 tablet, Rfl: 3 .  Multiple Vitamin (MULTIVITAMIN) tablet, Take 1 tablet by mouth daily., Disp: , Rfl:  .  ondansetron (ZOFRAN ODT) 4 MG disintegrating tablet, Take 1 tablet (4 mg total)  by mouth every 8 (eight) hours as needed for nausea or vomiting. (Patient not taking: Reported on 10/16/2018), Disp: 20 tablet, Rfl: 0 .  Red Yeast Rice Extract (RED YEAST RICE PO), Take 1 tablet by mouth daily. , Disp: , Rfl:   Review of Systems  Constitutional: Positive for weight loss (She reports that she has cut her sugar intake). Negative for fever.  HENT: Negative.   Eyes: Negative.   Respiratory: Negative.   Cardiovascular: Negative.   Gastrointestinal: Positive for abdominal pain ("some") and  diarrhea. Negative for bloating.  Endocrine: Negative.   Genitourinary: Negative.   Allergic/Immunologic: Negative.   Neurological: Negative.   Psychiatric/Behavioral: Negative.     Social History   Tobacco Use  . Smoking status: Never Smoker  . Smokeless tobacco: Never Used  . 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 Alcohol use: Yes    Comment: rare use 1-2 drinks per year.       Objective:   BP 138/60 (BP Location: Left Arm, Patient Position: Sitting, Cuff Size: Normal)   Pulse 62   Temp 97.7 F (36.5 C) (Oral)   Resp 16   Wt 151 lb 6.4 oz (68.7 kg)   SpO2 98%   BMI 23.71 kg/m  Vitals:   10/16/18 1616  BP: 138/60  Pulse: 62  Resp: 16  Temp: 97.7 F (36.5 C)  TempSrc: Oral  SpO2: 98%  Weight: 151 lb 6.4 oz (68.7 kg)     Physical Exam Vitals signs reviewed.  Constitutional:      Appearance: Normal appearance. She is normal weight.  HENT:     Head: Normocephalic and atraumatic.     Right Ear: External ear normal.     Left Ear: External ear normal.     Mouth/Throat:     Pharynx: Oropharynx is clear.  Eyes:     General: No scleral icterus.    Conjunctiva/sclera: Conjunctivae normal.  Cardiovascular:     Rate and Rhythm: Normal rate and regular rhythm.     Pulses: Normal pulses.     Heart sounds: Normal heart sounds.  Pulmonary:     Effort: Pulmonary effort is normal.     Breath sounds: Normal breath sounds.  Abdominal:     Palpations: Abdomen is soft. There is no mass.     Tenderness: There is no abdominal tenderness.  Musculoskeletal:        General: No swelling.  Lymphadenopathy:     Cervical: No cervical adenopathy.  Skin:    General: Skin is warm and dry.  Neurological:     General: No focal deficit present.     Mental Status: She is alert and oriented to person, place, and time. Mental status is at baseline.  Psychiatric:        Mood and Affect: Mood normal.        Behavior: Behavior  normal.        Thought Content: Thought content normal.        Judgment: Judgment normal.         Assessment & Plan    1. Diarrhea, unspecified type Try Questran first.  Could be lactose intolerance or gluten intolerance.  Has chronic anxiety. - CBC with Differential/Platelet - Comprehensive metabolic panel - Lipase  2. Dislocation of temporomandibular joint, sequela Requests refill of low-dose diazepam. - diazepam (VALIUM) 2 MG tablet; Take 1 tablet (2 mg total) by mouth every 12 (twelve) hours as needed for  anxiety.  Dispense: 60 tablet; Refill: 0  3. Essential hypertension   4. /hyperlipidemia   5. Anxiety Chronic   issue.  This may be contributing to today's complaints but patient wishes to have GI symptoms treated.  need to treat this in the future.     Cranford Mon, MD  [facility]

## 2018-10-17 DIAGNOSIS — R197 Diarrhea, unspecified: Secondary | ICD-10-CM | POA: Diagnosis not present

## 2018-10-18 ENCOUNTER — Telehealth: Payer: Self-pay | Admitting: Family Medicine

## 2018-10-18 LAB — CBC WITH DIFFERENTIAL/PLATELET
Basophils Absolute: 0 10*3/uL (ref 0.0–0.2)
Basos: 1 %
EOS (ABSOLUTE): 0.2 10*3/uL (ref 0.0–0.4)
Eos: 3 %
Hematocrit: 35.6 % (ref 34.0–46.6)
Hemoglobin: 12.1 g/dL (ref 11.1–15.9)
Immature Grans (Abs): 0 10*3/uL (ref 0.0–0.1)
Immature Granulocytes: 0 %
Lymphocytes Absolute: 1.9 10*3/uL (ref 0.7–3.1)
Lymphs: 33 %
MCH: 31.4 pg (ref 26.6–33.0)
MCHC: 34 g/dL (ref 31.5–35.7)
MCV: 93 fL (ref 79–97)
Monocytes Absolute: 0.7 10*3/uL (ref 0.1–0.9)
Monocytes: 12 %
Neutrophils Absolute: 3 10*3/uL (ref 1.4–7.0)
Neutrophils: 51 %
Platelets: 257 10*3/uL (ref 150–450)
RBC: 3.85 x10E6/uL (ref 3.77–5.28)
RDW: 12.4 % (ref 11.7–15.4)
WBC: 5.8 10*3/uL (ref 3.4–10.8)

## 2018-10-18 LAB — COMPREHENSIVE METABOLIC PANEL
ALT: 13 IU/L (ref 0–32)
AST: 20 IU/L (ref 0–40)
Albumin/Globulin Ratio: 2 (ref 1.2–2.2)
Albumin: 4.4 g/dL (ref 3.5–4.8)
Alkaline Phosphatase: 91 IU/L (ref 39–117)
BUN/Creatinine Ratio: 25 (ref 12–28)
BUN: 28 mg/dL — ABNORMAL HIGH (ref 8–27)
Bilirubin Total: 0.3 mg/dL (ref 0.0–1.2)
CO2: 20 mmol/L (ref 20–29)
Calcium: 10 mg/dL (ref 8.7–10.3)
Chloride: 105 mmol/L (ref 96–106)
Creatinine, Ser: 1.13 mg/dL — ABNORMAL HIGH (ref 0.57–1.00)
GFR calc Af Amer: 55 mL/min/{1.73_m2} — ABNORMAL LOW (ref >59)
GFR calc non Af Amer: 48 mL/min/{1.73_m2} — ABNORMAL LOW (ref >59)
Globulin, Total: 2.2 g/dL (ref 1.5–4.5)
Glucose: 121 mg/dL — ABNORMAL HIGH (ref 65–99)
Potassium: 4.4 mmol/L (ref 3.5–5.2)
Sodium: 142 mmol/L (ref 134–144)
Total Protein: 6.6 g/dL (ref 6.0–8.5)

## 2018-10-18 LAB — LIPASE: Lipase: 36 U/L (ref 14–85)

## 2018-10-18 NOTE — Telephone Encounter (Signed)
Patient advised as below. Patient verbalizes understanding and is in agreement with treatment plan.  

## 2018-10-18 NOTE — Telephone Encounter (Signed)
Cholestyramine will be fine for blood sugar, despite sucrose.  It is not well absorbed.  It can definitely cause bloating as it is meant to bulk up stools by binding like bile does.  This typically gets better if you stay on it.  If unable to tolerate, ok to hold it.

## 2018-10-18 NOTE — Telephone Encounter (Signed)
Pt needing a call back on Rx Dr. Rosanna Randy prescribed: Cholestyramine has 60 packages of powder has sucrose -  - pre diabetic -  Made her bloated and not feeling good.  Please advise.  Thanks, [employer]

## 2018-10-21 ENCOUNTER — Telehealth: Payer: Self-pay | Admitting: Family Medicine

## 2018-10-21 DIAGNOSIS — K529 Noninfective gastroenteritis and colitis, unspecified: Secondary | ICD-10-CM

## 2018-10-21 NOTE — Telephone Encounter (Signed)
Please review labs? Thanks! 

## 2018-10-21 NOTE — Telephone Encounter (Signed)
Pt called requesting call back to discuss lab results and to advise no longer taking cholestyramine (QUESTRAN) 4 GM/DOSE powder since Saturday because it stooped her from going to the bathroom and caused more stomach pain. Please advise. Thanks TNP

## 2018-10-22 NOTE — Addendum Note (Signed)
Addended by: Wilburt Finlay L on: 10/22/2018 12:16 PM   Modules accepted: Orders

## 2018-10-22 NOTE — Telephone Encounter (Signed)
Tried calling patient, no answer. Will try again later.  

## 2018-10-22 NOTE — Telephone Encounter (Signed)
Advised patient as below. Patient reports that she was very unhappy that no one called her back on Friday and yesterday. She reports that she was in severe pain, and the medication she was prescribed did not help.   She wanted you to be aware of this. Also, she wants to know why a GI consult is needed if her labwork was WNL? Please advise. Thanks!

## 2018-10-22 NOTE — Progress Notes (Signed)
Error

## 2018-10-22 NOTE — Telephone Encounter (Signed)
Advised patient as below. Order for GI referral placed. Patient requested Dr. Vira Agar.

## 2018-10-22 NOTE — Telephone Encounter (Signed)
GI because of her symptoms.  She can try stopping all mild products as lactose intolerance could be a cause.

## 2018-10-22 NOTE — Telephone Encounter (Signed)
-----   Message from Jerrol Banana., MD sent at 10/21/2018  2:08 PM EST ----- Labs OK--refer to GI if not done.

## 2018-10-25 ENCOUNTER — Other Ambulatory Visit
Admission: RE | Admit: 2018-10-25 | Discharge: 2018-10-25 | Disposition: A | Discharge status: Home / Self Care | Payer: PPO | Source: Ambulatory Visit | Attending: Unknown Physician Specialty | Admitting: Unknown Physician Specialty

## 2018-10-25 DIAGNOSIS — R197 Diarrhea, unspecified: Secondary | ICD-10-CM | POA: Diagnosis not present

## 2018-10-25 LAB — C DIFFICILE QUICK SCREEN W PCR REFLEX
C Diff antigen: NEGATIVE
C Diff interpretation: NOT DETECTED
C Diff toxin: NEGATIVE

## 2018-10-25 LAB — GASTROINTESTINAL PANEL BY PCR, STOOL (REPLACES STOOL CULTURE)

## 2018-10-28 ENCOUNTER — Other Ambulatory Visit: Payer: Self-pay | Admitting: Nurse Practitioner

## 2018-10-28 DIAGNOSIS — R197 Diarrhea, unspecified: Secondary | ICD-10-CM | POA: Diagnosis not present

## 2018-10-28 DIAGNOSIS — R1030 Lower abdominal pain, unspecified: Secondary | ICD-10-CM

## 2018-10-28 DIAGNOSIS — R634 Abnormal weight loss: Secondary | ICD-10-CM | POA: Diagnosis not present

## 2018-10-28 DIAGNOSIS — R1310 Dysphagia, unspecified: Secondary | ICD-10-CM | POA: Diagnosis not present

## 2018-10-28 DIAGNOSIS — K219 Gastro-esophageal reflux disease without esophagitis: Secondary | ICD-10-CM | POA: Diagnosis not present

## 2018-10-30 ENCOUNTER — Ambulatory Visit
Admission: RE | Admit: 2018-10-30 | Discharge: 2018-10-30 | Disposition: A | Discharge status: Home / Self Care | Payer: PPO | Source: Ambulatory Visit | Attending: Nurse Practitioner | Admitting: Nurse Practitioner

## 2018-10-30 DIAGNOSIS — R1030 Lower abdominal pain, unspecified: Secondary | ICD-10-CM | POA: Diagnosis not present

## 2018-10-30 DIAGNOSIS — K5730 Diverticulosis of large intestine without perforation or abscess without bleeding: Secondary | ICD-10-CM | POA: Diagnosis not present

## 2018-10-31 ENCOUNTER — Ambulatory Visit: Payer: Self-pay | Admitting: Family Medicine

## 2018-10-31 ENCOUNTER — Other Ambulatory Visit
Admission: RE | Admit: 2018-10-31 | Discharge: 2018-10-31 | Disposition: A | Discharge status: Home / Self Care | Payer: PPO | Source: Ambulatory Visit | Attending: Nurse Practitioner | Admitting: Nurse Practitioner

## 2018-10-31 DIAGNOSIS — K219 Gastro-esophageal reflux disease without esophagitis: Secondary | ICD-10-CM | POA: Diagnosis not present

## 2018-10-31 DIAGNOSIS — R634 Abnormal weight loss: Secondary | ICD-10-CM | POA: Diagnosis not present

## 2018-10-31 DIAGNOSIS — R1030 Lower abdominal pain, unspecified: Secondary | ICD-10-CM | POA: Diagnosis not present

## 2018-10-31 DIAGNOSIS — R197 Diarrhea, unspecified: Secondary | ICD-10-CM | POA: Diagnosis not present

## 2018-10-31 DIAGNOSIS — R1310 Dysphagia, unspecified: Secondary | ICD-10-CM | POA: Diagnosis not present

## 2018-11-01 LAB — CALPROTECTIN, FECAL: Calprotectin, Fecal: 30 ug/g (ref 0–120)

## 2018-11-04 ENCOUNTER — Ambulatory Visit: Payer: PPO | Admitting: Anesthesiology

## 2018-11-04 ENCOUNTER — Encounter
Admission: RE | Disposition: A | Discharge status: Home / Self Care | Payer: Self-pay | Source: Home / Self Care | Attending: Unknown Physician Specialty

## 2018-11-04 ENCOUNTER — Encounter: Payer: Self-pay | Admitting: Anesthesiology

## 2018-11-04 ENCOUNTER — Ambulatory Visit
Admission: RE | Admit: 2018-11-04 | Discharge: 2018-11-04 | Disposition: A | Discharge status: Home / Self Care | Payer: PPO | Attending: Unknown Physician Specialty | Admitting: Unknown Physician Specialty

## 2018-11-04 DIAGNOSIS — M26609 Unspecified temporomandibular joint disorder, unspecified side: Secondary | ICD-10-CM | POA: Diagnosis not present

## 2018-11-04 DIAGNOSIS — K210 Gastro-esophageal reflux disease with esophagitis: Secondary | ICD-10-CM | POA: Diagnosis not present

## 2018-11-04 DIAGNOSIS — M1990 Unspecified osteoarthritis, unspecified site: Secondary | ICD-10-CM | POA: Diagnosis not present

## 2018-11-04 DIAGNOSIS — I5032 Chronic diastolic (congestive) heart failure: Secondary | ICD-10-CM | POA: Insufficient documentation

## 2018-11-04 DIAGNOSIS — R0602 Shortness of breath: Secondary | ICD-10-CM | POA: Diagnosis not present

## 2018-11-04 DIAGNOSIS — R42 Dizziness and giddiness: Secondary | ICD-10-CM | POA: Diagnosis not present

## 2018-11-04 DIAGNOSIS — F329 Major depressive disorder, single episode, unspecified: Secondary | ICD-10-CM | POA: Diagnosis not present

## 2018-11-04 DIAGNOSIS — K649 Unspecified hemorrhoids: Secondary | ICD-10-CM | POA: Diagnosis not present

## 2018-11-04 DIAGNOSIS — K640 First degree hemorrhoids: Secondary | ICD-10-CM | POA: Insufficient documentation

## 2018-11-04 DIAGNOSIS — I739 Peripheral vascular disease, unspecified: Secondary | ICD-10-CM | POA: Insufficient documentation

## 2018-11-04 DIAGNOSIS — I2510 Atherosclerotic heart disease of native coronary artery without angina pectoris: Secondary | ICD-10-CM | POA: Insufficient documentation

## 2018-11-04 DIAGNOSIS — Z79899 Other long term (current) drug therapy: Secondary | ICD-10-CM | POA: Insufficient documentation

## 2018-11-04 DIAGNOSIS — D649 Anemia, unspecified: Secondary | ICD-10-CM | POA: Diagnosis not present

## 2018-11-04 DIAGNOSIS — Z9049 Acquired absence of other specified parts of digestive tract: Secondary | ICD-10-CM | POA: Insufficient documentation

## 2018-11-04 DIAGNOSIS — Z882 Allergy status to sulfonamides status: Secondary | ICD-10-CM | POA: Insufficient documentation

## 2018-11-04 DIAGNOSIS — G629 Polyneuropathy, unspecified: Secondary | ICD-10-CM | POA: Insufficient documentation

## 2018-11-04 DIAGNOSIS — E7800 Pure hypercholesterolemia, unspecified: Secondary | ICD-10-CM | POA: Diagnosis not present

## 2018-11-04 DIAGNOSIS — Z7951 Long term (current) use of inhaled steroids: Secondary | ICD-10-CM | POA: Insufficient documentation

## 2018-11-04 DIAGNOSIS — R002 Palpitations: Secondary | ICD-10-CM | POA: Diagnosis not present

## 2018-11-04 DIAGNOSIS — I110 Hypertensive heart disease with heart failure: Secondary | ICD-10-CM | POA: Diagnosis not present

## 2018-11-04 DIAGNOSIS — K222 Esophageal obstruction: Secondary | ICD-10-CM | POA: Diagnosis not present

## 2018-11-04 DIAGNOSIS — K5730 Diverticulosis of large intestine without perforation or abscess without bleeding: Secondary | ICD-10-CM | POA: Insufficient documentation

## 2018-11-04 DIAGNOSIS — Z90710 Acquired absence of both cervix and uterus: Secondary | ICD-10-CM | POA: Diagnosis not present

## 2018-11-04 DIAGNOSIS — R1310 Dysphagia, unspecified: Secondary | ICD-10-CM | POA: Diagnosis not present

## 2018-11-04 DIAGNOSIS — I071 Rheumatic tricuspid insufficiency: Secondary | ICD-10-CM | POA: Insufficient documentation

## 2018-11-04 DIAGNOSIS — R1032 Left lower quadrant pain: Secondary | ICD-10-CM | POA: Diagnosis not present

## 2018-11-04 DIAGNOSIS — Z7722 Contact with and (suspected) exposure to environmental tobacco smoke (acute) (chronic): Secondary | ICD-10-CM | POA: Insufficient documentation

## 2018-11-04 DIAGNOSIS — Z885 Allergy status to narcotic agent status: Secondary | ICD-10-CM | POA: Insufficient documentation

## 2018-11-04 DIAGNOSIS — Z888 Allergy status to other drugs, medicaments and biological substances status: Secondary | ICD-10-CM | POA: Insufficient documentation

## 2018-11-04 DIAGNOSIS — K5790 Diverticulosis of intestine, part unspecified, without perforation or abscess without bleeding: Secondary | ICD-10-CM | POA: Diagnosis not present

## 2018-11-04 DIAGNOSIS — F419 Anxiety disorder, unspecified: Secondary | ICD-10-CM | POA: Insufficient documentation

## 2018-11-04 DIAGNOSIS — R197 Diarrhea, unspecified: Secondary | ICD-10-CM | POA: Insufficient documentation

## 2018-11-04 DIAGNOSIS — F418 Other specified anxiety disorders: Secondary | ICD-10-CM | POA: Diagnosis not present

## 2018-11-04 DIAGNOSIS — Z881 Allergy status to other antibiotic agents status: Secondary | ICD-10-CM | POA: Insufficient documentation

## 2018-11-04 HISTORY — PX: ESOPHAGOGASTRODUODENOSCOPY (EGD) WITH PROPOFOL: SHX5813

## 2018-11-04 HISTORY — PX: COLONOSCOPY WITH PROPOFOL: SHX5780

## 2018-11-04 SURGERY — COLONOSCOPY WITH PROPOFOL
Anesthesia: General

## 2018-11-04 MED ORDER — FENTANYL CITRATE (PF) 100 MCG/2ML IJ SOLN
INTRAMUSCULAR | Status: AC
  Filled 2018-11-04: qty 2

## 2018-11-04 MED ORDER — PROPOFOL 500 MG/50ML IV EMUL
INTRAVENOUS | Status: DC | PRN
  Administered 2018-11-04: 120 ug/kg/min via INTRAVENOUS

## 2018-11-04 MED ORDER — FENTANYL CITRATE (PF) 100 MCG/2ML IJ SOLN
INTRAMUSCULAR | Status: DC | PRN
  Administered 2018-11-04: 50 ug via INTRAVENOUS

## 2018-11-04 MED ORDER — PROPOFOL 500 MG/50ML IV EMUL
INTRAVENOUS | Status: AC
  Filled 2018-11-04: qty 50

## 2018-11-04 MED ORDER — MIDAZOLAM HCL 2 MG/2ML IJ SOLN
INTRAMUSCULAR | Status: AC
  Filled 2018-11-04: qty 2

## 2018-11-04 MED ORDER — LIDOCAINE HCL (CARDIAC) PF 100 MG/5ML IV SOSY
PREFILLED_SYRINGE | INTRAVENOUS | Status: DC | PRN
  Administered 2018-11-04: 30 mg via INTRAVENOUS

## 2018-11-04 MED ORDER — SODIUM CHLORIDE 0.9 % IV SOLN
INTRAVENOUS | Status: DC
  Administered 2018-11-04: 1000 mL via INTRAVENOUS

## 2018-11-04 MED ORDER — MIDAZOLAM HCL 2 MG/2ML IJ SOLN
INTRAMUSCULAR | Status: DC | PRN
  Administered 2018-11-04: 1 mg via INTRAVENOUS

## 2018-11-04 NOTE — Anesthesia Postprocedure Evaluation (Signed)
Anesthesia Post Note  Patient: Khya Halls [facility]  Procedure(s) Performed: COLONOSCOPY WITH PROPOFOL (N/A ) ESOPHAGOGASTRODUODENOSCOPY (EGD) WITH PROPOFOL (N/A )  Patient location during evaluation: Endoscopy Anesthesia Type: General Level of consciousness: awake and alert Pain management: pain level controlled Vital Signs Assessment: post-procedure vital signs reviewed and stable Respiratory status: spontaneous breathing and respiratory function stable Cardiovascular status: stable Anesthetic complications: no     Last Vitals:  Vitals:   11/04/18 1348 11/04/18 1534  BP: 137/68 (!) 115/50  Pulse: 67 68  Resp: 18 15  Temp: (!) 36.4 C (!) 36.1 C  SpO2: 100% 100%    Last Pain:  Vitals:   11/04/18 1534  TempSrc: Tympanic  PainSc:                  KEPHART,WILLIAM K

## 2018-11-04 NOTE — Transfer of Care (Signed)
Immediate Anesthesia Transfer of Care Note  [name]  Procedure(s) Performed: COLONOSCOPY WITH PROPOFOL (N/A ) ESOPHAGOGASTRODUODENOSCOPY (EGD) WITH PROPOFOL (N/A )  Patient Location: PACU  Anesthesia Type:General  Level of Consciousness: sedated  Airway & Oxygen Therapy: Patient Spontanous Breathing and Patient connected to nasal cannula oxygen  Post-op Assessment: Report given to RN and Post -op Vital signs reviewed and stable  Post vital signs: Reviewed and stable  Last Vitals:  Vitals Value Taken Time  BP 115/50 11/04/2018  3:34 PM  Temp 36.1 C 11/04/2018  3:34 PM  Pulse 63 11/04/2018  3:35 PM  Resp 14 11/04/2018  3:35 PM  SpO2 100 % 11/04/2018  3:35 PM  Vitals shown include unvalidated device data.  Last Pain:  Vitals:   11/04/18 1534  TempSrc: Tympanic  PainSc:       Patients Stated Pain Goal: 0 (26/20/35 5974)  Complications: No apparent anesthesia complications

## 2018-11-04 NOTE — Op Note (Addendum)
[facility] Gastroenterology Patient Name: Jessica Armstrong Procedure Date: 11/04/2018 2:43 PM MRN: [id number] Account #: [id number] Date of Birth: 1946-07-31 Admit Type: Outpatient Age: 73 Room: [facility] ENDO ROOM 1 Gender: Female Note Status: Finalized Procedure:            Upper GI endoscopy Indications:          Dysphagia Providers:            Manya Silvas, MD Referring MD:         Janine Ores. Rosanna Randy, MD (Referring MD) Medicines:            Propofol per Anesthesia Complications:        No immediate complications. Procedure:            Pre-Anesthesia Assessment:                       - After reviewing the risks and benefits, the patient                        was deemed in satisfactory condition to undergo the                        procedure.                       After obtaining informed consent, the endoscope was                        passed under direct vision. Throughout the procedure,                        the patient's blood pressure, pulse, and oxygen                        saturations were monitored continuously. The Endoscope                        was introduced through the mouth, and advanced to the                        second part of duodenum. The upper GI endoscopy was                        accomplished without difficulty. The patient tolerated                        the procedure well. Findings:      LA Grade A (one or more mucosal breaks less than 5 mm, not extending       between tops of 2 mucosal folds) esophagitis with no bleeding was found       39 cm from the incisors. Biopsies were taken with a cold forceps for       histology.      The stomach was normal.      The examined duodenum was normal.      A mild Schatzki ring was found at the gastroesophageal junction. A       guidewire was placed and the scope was withdrawn. Dilation was performed       with a Savary dilator with mild resistance at 15 mm. Impression:           -  LA Grade A  reflux esophagitis. Rule out Barrett's                        esophagus. Biopsied.                       - Normal stomach.                       - Normal examined duodenum. Recommendation:       - Await pathology results.                       - Soft diet for 2 days. Manya Silvas, MD 11/04/2018 3:05:52 PM This report has been signed electronically. Number of Addenda: 0 Note Initiated On: 11/04/2018 2:43 PM      [facility]

## 2018-11-04 NOTE — Anesthesia Post-op Follow-up Note (Signed)
Anesthesia QCDR form completed.        

## 2018-11-04 NOTE — Op Note (Signed)
[facility] Gastroenterology Patient Name: Jessica Armstrong Procedure Date: 11/04/2018 2:42 PM MRN: [id number] Account #: [id number] Date of Birth: 1946/07/18 Admit Type: Outpatient Age: 73 Room: [facility] ENDO ROOM 1 Gender: Female Note Status: Finalized Procedure:            Colonoscopy Indications:          Abdominal pain in the left lower quadrant, Diarrhea Providers:            Manya Silvas, MD Referring MD:         Janine Ores. Rosanna Randy, MD (Referring MD) Medicines:            Propofol per Anesthesia Complications:        No immediate complications. Procedure:            Pre-Anesthesia Assessment:                       - After reviewing the risks and benefits, the patient                        was deemed in satisfactory condition to undergo the                        procedure.                       After obtaining informed consent, the colonoscope was                        passed under direct vision. Throughout the procedure,                        the patient's blood pressure, pulse, and oxygen                        saturations were monitored continuously. The                        Colonoscope was introduced through the anus and                        advanced to the the cecum, identified by appendiceal                        orifice and ileocecal valve. The colonoscopy was                        somewhat difficult due to restricted mobility of the                        colon. The patient tolerated the procedure well. The                        quality of the bowel preparation was good. Findings:      Many small-mouthed diverticula were found in the sigmoid colon.      Internal hemorrhoids were found during endoscopy. The hemorrhoids were       small and Grade I (internal hemorrhoids that do not prolapse).      The exam was otherwise without abnormality. No polyps seen. Impression:           - Diverticulosis in  the sigmoid colon.                       -  Internal hemorrhoids.                       - The examination was otherwise normal.                       - No specimens collected. Recommendation:       - The findings and recommendations were discussed with                        the patient's family. Repeat in 5 years. Manya Silvas, MD 11/04/2018 3:34:40 PM This report has been signed electronically. Number of Addenda: 0 Note Initiated On: 11/04/2018 2:42 PM Scope Withdrawal Time: 0 hours 5 minutes 45 seconds  Total Procedure Duration: 0 hours 19 minutes 59 seconds       [facility]

## 2018-11-04 NOTE — Anesthesia Preprocedure Evaluation (Signed)
Anesthesia Evaluation  Patient identified by MRN, date of birth, ID band Patient awake    Reviewed: Allergy & Precautions, NPO status , Patient's Chart, lab work & pertinent test results, reviewed documented beta blocker date and time   History of Anesthesia Complications (+) PONV and history of anesthetic complications  Airway Mallampati: III  TM Distance: >3 FB     Dental  (+) Chipped   Pulmonary shortness of breath,           Cardiovascular hypertension, Pt. on medications and Pt. on home beta blockers +CHF  + Valvular Problems/Murmurs      Neuro/Psych PSYCHIATRIC DISORDERS Anxiety Depression    GI/Hepatic GERD  ,  Endo/Other    Renal/GU      Musculoskeletal  (+) Arthritis ,   Abdominal   Peds  Hematology  (+) anemia ,   Anesthesia Other Findings Echo 3 yrs 55-60. EKG ok.  Reproductive/Obstetrics                             Anesthesia Physical Anesthesia Plan  ASA: III  Anesthesia Plan: General   Post-op Pain Management:    Induction: Intravenous  PONV Risk Score and Plan:   Airway Management Planned:   Additional Equipment:   Intra-op Plan:   Post-operative Plan:   Informed Consent: I have reviewed the patients History and Physical, chart, labs and discussed the procedure including the risks, benefits and alternatives for the proposed anesthesia with the patient or authorized representative who has indicated his/her understanding and acceptance.       Plan Discussed with: CRNA  Anesthesia Plan Comments:         Anesthesia Quick Evaluation

## 2018-11-04 NOTE — H&P (Signed)
Primary Care Physician:  Jerrol Banana., MD Primary Gastroenterologist:  Dr. Vira Agar  Pre-Procedure History & Physical: HPI:  Jessica Armstrong is a 73 y.o. female is here for an endoscopy and colonoscopy.     Past Medical History:  Diagnosis Date  . Anemia    mild; no medical treatment  . Arthritis   . Carotid artery stenosis    a. doppler 10/6107: RICA 60-45%, LICA 4-09% f/u 1 year  . Cervical spondyloarthritis   . Chronic diastolic CHF (congestive heart failure) ([place])    a. echo 04/2015: EF 55-60%, no RWMA, mitral valve w/ systolic bowing w/o prolapse, PASP 53 mmHg  . Claudication ([place])   . Colon polyp   . Cystitis   . Depression    situational  . Dyspnea   . GERD (gastroesophageal reflux disease)   . H/O vertigo   . History of measles   . History of mumps   . Hypercholesterolemia   . Hyperglycemia   . Hypertension   . Need for SBE (subacute bacterial endocarditis) prophylaxis   . Neuropathy   . Palpitations   . PONV (postoperative nausea and vomiting)    severe n/v after anesthesia  . Post-menopausal   . Pulmonary valve dysplasia   . Rotator cuff tear, right   . Seasonal allergies   . TMJ (dislocation of temporomandibular joint)   . Tricuspid regurgitation    a. echo 04/2013: EF 55-60%, nl wall motion, mild to mod TR, PASP 35 mm Hg    Past Surgical History:  Procedure Laterality Date  . ABDOMINAL HYSTERECTOMY    . ANTERIOR CERVICAL DECOMP/DISCECTOMY FUSION N/A 08/26/2013   Procedure: CERVICAL SIX TO SEVEN ANTERIOR CERVICAL DECOMPRESSION/DISCECTOMY FUSION 1 LEVEL;  Surgeon: Erline Levine, MD;  Location: [facility];  Service: Neurosurgery;  Laterality: N/A;  C6-7 Anterior cervical decompression/diskectomy/fusion  . BREAST BIOPSY Right    neg  . CHOLECYSTECTOMY    . COLONOSCOPY WITH PROPOFOL N/A 01/14/2016   Procedure: COLONOSCOPY WITH PROPOFOL;  Surgeon: Manya Silvas, MD;  Location: [facility] ENDOSCOPY;  Service: Endoscopy;  Laterality: N/A;  .  ESOPHAGOGASTRODUODENOSCOPY (EGD) WITH PROPOFOL N/A 01/14/2016   Procedure: ESOPHAGOGASTRODUODENOSCOPY (EGD) WITH PROPOFOL;  Surgeon: Manya Silvas, MD;  Location: [facility] ENDOSCOPY;  Service: Endoscopy;  Laterality: N/A;  . EYE SURGERY     benign tumor of eye  . SHOULDER ARTHROSCOPY WITH ROTATOR CUFF REPAIR AND SUBACROMIAL DECOMPRESSION Right 05/31/2017   Procedure: SHOULDER ARTHROSCOPY WITH SUBACROMIAL DECOMPRESSION AND DISTAL CLAVICAL EXCISION;  Surgeon: Tania Ade, MD;  Location: [facility];  Service: Orthopedics;  Laterality: Right;  SHOULDER ARTHROSCOPY WITH SUBACROMIAL DECOMPRESSION AND DISTAL CLAVICAL EXCISION    Prior to Admission medications   Medication Sig Start Date End Date Taking? Authorizing Provider  Cholecalciferol (D3 ADULT PO) Take 5,000 Units by mouth daily.   Yes [provider]  diazepam (VALIUM) 2 MG tablet Take 1 tablet (2 mg total) by mouth every 12 (twelve) hours as needed for anxiety. Patient taking differently: Take 2 mg by mouth 3 (three) times daily as needed for anxiety.  10/16/18  Yes Jerrol Banana., MD  metoprolol succinate (TOPROL-XL) 25 MG 24 hr tablet TAKE ONE TABLET BY MOUTH EVERY DAY 09/14/17  Yes Jerrol Banana., MD  Multiple Vitamin (MULTIVITAMIN) tablet Take 1 tablet by mouth daily.   Yes [provider]  pantoprazole (PROTONIX) 40 MG tablet TAKE 1 TABLET BY MOUTH TWICE DAILY 06/09/18  Yes Jerrol Banana., MD  PARoxetine (  PAXIL) 20 MG tablet TAKE ONE TABLET EVERY DAY 08/13/18  Yes Jerrol Banana., MD  Red Yeast Rice Extract (RED YEAST RICE PO) Take 1 tablet by mouth daily.    Yes [provider]  triamterene-hydrochlorothiazide (DYAZIDE) 37.5-25 MG capsule TAKE 1 CAPSULE BY MOUTH EVERY DAY 09/18/17  Yes Jerrol Banana., MD  verapamil (CALAN-SR) 180 MG CR tablet TAKE ONE TABLET BY MOUTH TWICE DAILY Patient taking differently: Take 90 mg by mouth daily.  09/09/18  Yes Jerrol Banana., MD   albuterol [facility] HFA) 108 [phone] Base) MCG/ACT inhaler Inhale 1 puff into the lungs every 4 (four) hours as needed for wheezing or shortness of breath. Patient not taking: Reported on 11/04/2018 08/22/18   Jerrol Banana., MD  amoxicillin (AMOXIL) 500 MG capsule TAKE 4 CAPSULES ONE HOUR PRIOR TO [facility] Patient not taking: Reported on 10/16/2018 01/30/18   Jerrol Banana., MD  Cinnamon 500 MG capsule Take 500 mg by mouth daily.    [provider]  docusate sodium (COLACE) 50 MG capsule Take 50 mg by mouth daily.    [provider]  hydrALAZINE (APRESOLINE) 50 MG tablet TAKE ONE TABLET 3 TIMES DAILY Patient taking differently: 25 mg daily.  06/18/17   Jerrol Banana., MD  HYDROcodone-acetaminophen (NORCO) 5-325 MG tablet Take 1 tablet by mouth every 6 (six) hours as needed for up to 7 doses for severe pain. Patient not taking: Reported on 10/16/2018 11/10/17   Darel Hong, MD  loratadine (CLARITIN) 10 MG tablet Take 1 tablet (10 mg total) by mouth daily. Patient not taking: Reported on 10/16/2018 12/07/15   Jerrol Banana., MD  meclizine (ANTIVERT) 25 MG tablet Take 1 tablet (25 mg total) by mouth 2 (two) times daily as needed for dizziness. 03/22/18   Jerrol Banana., MD  ondansetron (ZOFRAN ODT) 4 MG disintegrating tablet Take 1 tablet (4 mg total) by mouth every 8 (eight) hours as needed for nausea or vomiting. Patient not taking: Reported on 10/16/2018 11/10/17   Darel Hong, MD    Allergies as of 10/28/2018 - Review Complete 10/16/2018  Allergen Reaction Noted  . Cholestyramine Nausea And Vomiting 10/22/2018  . Ciprofloxacin Nausea And Vomiting 06/15/2015  . Codeine sulfate Nausea And Vomiting 04/07/2013  . Keflex [cephalexin] Nausea And Vomiting 04/07/2013  . Macrobid [nitrofurantoin macrocrystal] Nausea And Vomiting 04/07/2013  . Sulfa antibiotics Nausea And Vomiting 04/07/2013    Family History  Problem Relation Age of Onset  .  Heart disease Mother   . Hypertension Mother   . COPD Mother        was a smoker  . Hypertension Brother   . Colon cancer Father   . Asthma Maternal Grandfather   . Breast cancer Neg Hx     Social History   Socioeconomic History  . Marital status: Married    Spouse name: Not on file  . Number of children: Not on file  . Years of education: Not on file  . Highest education level: Not on file  Occupational History  . Not on file  Social Needs  . Financial resource strain: Not on file  . Food insecurity:    Worry: Not on file    Inability: Not on file  . Transportation needs:    Medical: Not on file    Non-medical: Not on file  Tobacco Use  . Smoking status: Never Smoker  . Smokeless tobacco: Never Used  .  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 Alcohol use: Yes    Comment: rare use 1-2 drinks per year.   . Drug use: No  . Sexual activity: Not on file  Lifestyle  . Physical activity:    Days per week: Not on file    Minutes per session: Not on file  . Stress: Not on file  Relationships  . Social connections:    Talks on phone: Not on file    Gets together: Not on file    Attends religious service: Not on file    Active member of club or organization: Not on file    Attends meetings of clubs or organizations: Not on file    Relationship status: Not on file  . Intimate partner violence:    Fear of current or ex partner: Not on file    Emotionally abused: Not on file    Physically abused: Not on file    Forced sexual activity: Not on file  Other Topics Concern  . Not on file  Social History Narrative  . Not on file    Review of Systems: See HPI, otherwise negative ROS  Physical Exam: BP 137/68   Pulse 67   Temp (!) 97.5 F (36.4 C) (Tympanic)   Resp 18   Ht 5\' 7"  (1.702 m)   Wt 67.1 kg   SpO2 100%   BMI 23.18 kg/m  General:   Alert,  pleasant and cooperative in NAD Head:  Normocephalic  and atraumatic. Neck:  Supple; no masses or thyromegaly. Lungs:  Clear throughout to auscultation.    Heart:  Regular rate and rhythm. Abdomen:  Soft, nontender and nondistended. Normal bowel sounds, without guarding, and without rebound.   Neurologic:  Alert and  oriented x4;  grossly normal neurologically.  Impression/Plan: Jessica Armstrong is here for an endoscopy and colonoscopy to be performed for dysphagia and GERD, diarrhea and lower abd pain.  Risks, benefits, limitations, and alternatives regarding  endoscopy and colonoscopy have been reviewed with the patient.  Questions have been answered.  All parties agreeable.   Gaylyn Cheers, MD  11/04/2018, 2:50 PM dysphagia

## 2018-11-06 ENCOUNTER — Encounter: Payer: Self-pay | Admitting: Unknown Physician Specialty

## 2018-11-06 LAB — SURGICAL PATHOLOGY

## 2018-11-21 ENCOUNTER — Other Ambulatory Visit: Payer: Self-pay

## 2018-11-21 ENCOUNTER — Ambulatory Visit (INDEPENDENT_AMBULATORY_CARE_PROVIDER_SITE_OTHER): Payer: PPO | Admitting: Family Medicine

## 2018-11-21 ENCOUNTER — Encounter: Payer: Self-pay | Admitting: Family Medicine

## 2018-11-21 VITALS — BP 126/78 | Temp 98.8°F | Resp 16 | Wt 146.0 lb

## 2018-11-21 DIAGNOSIS — N390 Urinary tract infection, site not specified: Secondary | ICD-10-CM | POA: Diagnosis not present

## 2018-11-21 DIAGNOSIS — K529 Noninfective gastroenteritis and colitis, unspecified: Secondary | ICD-10-CM | POA: Diagnosis not present

## 2018-11-21 DIAGNOSIS — R1030 Lower abdominal pain, unspecified: Secondary | ICD-10-CM | POA: Diagnosis not present

## 2018-11-21 DIAGNOSIS — J069 Acute upper respiratory infection, unspecified: Secondary | ICD-10-CM

## 2018-11-21 DIAGNOSIS — K5792 Diverticulitis of intestine, part unspecified, without perforation or abscess without bleeding: Secondary | ICD-10-CM | POA: Diagnosis not present

## 2018-11-21 LAB — POCT URINALYSIS DIPSTICK
Bilirubin, UA: NEGATIVE
Glucose, UA: NEGATIVE
Ketones, UA: NEGATIVE
Nitrite, UA: NEGATIVE
Protein, UA: NEGATIVE
Spec Grav, UA: 1.025 (ref 1.010–1.025)
Urobilinogen, UA: 0.2 E.U./dL
pH, UA: 6 (ref 5.0–8.0)

## 2018-11-21 MED ORDER — DOXYCYCLINE HYCLATE 100 MG PO TABS: 100.0000 mg | ORAL_TABLET | Freq: Two times a day (BID) | ORAL | 0 refills | Status: AC

## 2018-11-21 NOTE — Progress Notes (Signed)
[name] Female    DOB: [birth year]   73 y.o.   MRN: [id number] Visit Date: 11/21/2018  Today's Provider: Wilhemena Durie, MD   Chief Complaint  Patient presents with  . Abdominal Pain    bouts of diarrhea and constipation with pain   Subjective:     HPI  Patient comes in today c/o abdominal pain that has been ongoing for about 3 months. She reports that she has had an extensive work up for this, and no one can find out what is causing her symptoms. She describes the pain as pressure and heaviness. She initially had diarrhea, but reports that now she is having constipation. Her colonscopy and endoscopy on 11/04/2018 by Dr. Vira Agar was normal.   She also mentions that she has had URI symptoms for the last week. She has cough, congestion, and PND.   Allergies  Allergen Reactions  . Cholestyramine Nausea And Vomiting  . Ciprofloxacin Nausea And Vomiting  . Codeine Sulfate Nausea And Vomiting  . Keflex [Cephalexin] Nausea And Vomiting  . Macrobid [Nitrofurantoin Macrocrystal] Nausea And Vomiting  . Sulfa Antibiotics Nausea And Vomiting and Rash     Current Outpatient Medications:  .  Cholecalciferol (D3 ADULT PO), Take 5,000 Units by mouth daily., Disp: , Rfl:  .  Cinnamon 500 MG capsule, Take 500 mg by mouth daily., Disp: , Rfl:  .  diazepam (VALIUM) 2 MG tablet, Take 1 tablet (2 mg total) by mouth every 12 (twelve) hours as needed for anxiety. (Patient taking differently: Take 2 mg by mouth 3 (three) times daily as needed for anxiety. ), Disp: 60 tablet, Rfl: 0 .  docusate sodium (COLACE) 50 MG capsule, Take 50 mg by mouth daily., Disp: , Rfl:  .  hydrALAZINE (APRESOLINE) 50 MG tablet, TAKE ONE TABLET 3 TIMES DAILY (Patient taking differently: 25 mg daily. ), Disp: 270 tablet, Rfl: 3 .  meclizine (ANTIVERT) 25 MG tablet, Take 1 tablet (25 mg total) by mouth 2 (two) times daily as needed for dizziness., Disp: 60 tablet, Rfl: 2 .  metoprolol succinate  (TOPROL-XL) 25 MG 24 hr tablet, TAKE ONE TABLET BY MOUTH EVERY DAY, Disp: 90 tablet, Rfl: 3 .  Multiple Vitamin (MULTIVITAMIN) tablet, Take 1 tablet by mouth daily., Disp: , Rfl:  .  pantoprazole (PROTONIX) 40 MG tablet, TAKE 1 TABLET BY MOUTH TWICE DAILY, Disp: 180 tablet, Rfl: 3 .  PARoxetine (PAXIL) 20 MG tablet, TAKE ONE TABLET EVERY DAY, Disp: 90 tablet, Rfl: 3 .  Red Yeast Rice Extract (RED YEAST RICE PO), Take 1 tablet by mouth daily. , Disp: , Rfl:  .  triamterene-hydrochlorothiazide (DYAZIDE) 37.5-25 MG capsule, TAKE 1 CAPSULE BY MOUTH EVERY DAY, Disp: 90 capsule, Rfl: 3 .  verapamil (CALAN-SR) 180 MG CR tablet, TAKE ONE TABLET BY MOUTH TWICE DAILY (Patient taking differently: Take 90 mg by mouth daily. ), Disp: 180 tablet, Rfl: 3 .  albuterol (PROAIR HFA) 108 (90 Base) MCG/ACT inhaler, Inhale 1 puff into the lungs every 4 (four) hours as needed for wheezing or shortness of breath. (Patient not taking: Reported on 11/04/2018), Disp: 3 Inhaler, Rfl: 3 .  amoxicillin (AMOXIL) 500 MG capsule, TAKE 4 CAPSULES ONE HOUR PRIOR TO DENTALPROCEDURE (Patient not taking: Reported on 10/16/2018), Disp: 4 capsule, Rfl: 3 .  HYDROcodone-acetaminophen (NORCO) 5-325 MG tablet, Take 1 tablet by mouth every 6 (six) hours as needed for up to 7 doses for severe pain. (Patient not taking: Reported on  11/21/2018), Disp: 7 tablet, Rfl: 0 .  loratadine (CLARITIN) 10 MG tablet, Take 1 tablet (10 mg total) by mouth daily. (Patient not taking: Reported on 10/16/2018), Disp: 90 tablet, Rfl: 3 .  ondansetron (ZOFRAN ODT) 4 MG disintegrating tablet, Take 1 tablet (4 mg total) by mouth every 8 (eight) hours as needed for nausea or vomiting. (Patient not taking: Reported on 10/16/2018), Disp: 20 tablet, Rfl: 0  Review of Systems  Constitutional: Positive for activity change, appetite change and fatigue. Negative for chills, diaphoresis, fever and unexpected weight change.  HENT: Positive for congestion, postnasal drip, rhinorrhea,  sneezing, sore throat, trouble swallowing and voice change.   Respiratory: Positive for cough.   Cardiovascular: Negative.   Gastrointestinal: Positive for abdominal pain, constipation, diarrhea and nausea. Negative for abdominal distention, anal bleeding, blood in stool, rectal pain and vomiting.  Endocrine: Negative.   Genitourinary: Positive for dysuria and urgency. Negative for difficulty urinating, flank pain, frequency, hematuria, pelvic pain, vaginal bleeding, vaginal discharge and vaginal pain.  Skin: Negative.   Allergic/Immunologic: Positive for environmental allergies.  Neurological: Positive for headaches.  Hematological: Negative.   Psychiatric/Behavioral: The patient is nervous/anxious.     Social History   Tobacco Use  . Smoking status: Never Smoker  . Smokeless tobacco: Never Used  . 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 Alcohol use: Yes    Comment: rare use 1-2 drinks per year.       Objective:   BP 126/78 (BP Location: Left Arm, Patient Position: Sitting, Cuff Size: Normal)   Temp 98.8 F (37.1 C)   Resp 16   Wt 146 lb (66.2 kg)   BMI 22.87 kg/m  Vitals:   11/21/18 1116  BP: 126/78  Resp: 16  Temp: 98.8 F (37.1 C)  Weight: 146 lb (66.2 kg)     Physical Exam Vitals signs reviewed.  Constitutional:      Appearance: Normal appearance. She is normal weight.  HENT:     Head: Normocephalic and atraumatic.     Right Ear: External ear normal.     Left Ear: External ear normal.     Mouth/Throat:     Pharynx: Oropharynx is clear.  Eyes:     General: No scleral icterus.    Conjunctiva/sclera: Conjunctivae normal.  Cardiovascular:     Rate and Rhythm: Normal rate and regular rhythm.     Pulses: Normal pulses.     Heart sounds: Normal heart sounds.  Pulmonary:     Effort: Pulmonary effort is normal.     Breath sounds: Normal breath sounds.  Abdominal:     General: Bowel sounds are normal.      Palpations: Abdomen is soft. There is no mass.     Tenderness: There is no abdominal tenderness.  Musculoskeletal:        General: No swelling.  Lymphadenopathy:     Cervical: No cervical adenopathy.  Skin:    General: Skin is warm and dry.  Neurological:     General: No focal deficit present.     Mental Status: She is alert and oriented to person, place, and time. Mental status is at baseline.  Psychiatric:        Mood and Affect: Mood normal.        Behavior: Behavior normal.        Thought Content: Thought content normal.        Judgment: Judgment normal.  Assessment & Plan    1. Chronic diarrhea Tried stopping dairy products first.  Then triy gluten products.  Pt saw GI.  Colonoscopy performed 2 weeks ago.  2. Lower abdominal pain  - POCT urinalysis dipstick - Urine Culture  3. Urinary tract infection without hematuria, site unspecified  - doxycycline (VIBRA-TABS) 100 MG tablet; Take 1 tablet (100 mg total) by mouth 2 (two) times daily for 7 days.  Dispense: 14 tablet; Refill: 0  4. Diverticulitis Cover for possible diverticulitis.  Return to clinic 2 to 3 weeks. - doxycycline (VIBRA-TABS) 100 MG tablet; Take 1 tablet (100 mg total) by mouth 2 (two) times daily for 7 days.  Dispense: 14 tablet; Refill: 0 5.URI No treatment necessary.  I have done the exam and reviewed the chart and it is accurate to the best of my knowledge. Development worker, community has been used and  any errors in dictation or transcription are unintentional. Miguel Aschoff M.D. [place], MD  [facility]

## 2018-11-23 LAB — URINE CULTURE

## 2018-11-26 ENCOUNTER — Telehealth: Payer: Self-pay | Admitting: *Deleted

## 2018-11-26 NOTE — Telephone Encounter (Signed)
-----   Message from Jerrol Banana., MD sent at 11/23/2018  6:18 PM EST ----- No UTI

## 2018-11-26 NOTE — Telephone Encounter (Signed)
Patient was treated on last OV.

## 2018-11-26 NOTE — Telephone Encounter (Signed)
Tried calling pt, no answer and no vm

## 2018-12-05 DIAGNOSIS — R1084 Generalized abdominal pain: Secondary | ICD-10-CM | POA: Diagnosis not present

## 2018-12-05 DIAGNOSIS — R197 Diarrhea, unspecified: Secondary | ICD-10-CM | POA: Diagnosis not present

## 2018-12-05 DIAGNOSIS — R634 Abnormal weight loss: Secondary | ICD-10-CM | POA: Diagnosis not present

## 2018-12-05 DIAGNOSIS — R1310 Dysphagia, unspecified: Secondary | ICD-10-CM | POA: Diagnosis not present

## 2018-12-05 DIAGNOSIS — K219 Gastro-esophageal reflux disease without esophagitis: Secondary | ICD-10-CM | POA: Diagnosis not present

## 2018-12-09 ENCOUNTER — Other Ambulatory Visit: Payer: Self-pay | Admitting: Family Medicine

## 2018-12-09 NOTE — Telephone Encounter (Signed)
Pharmacy requesting refills. Thanks!  

## 2018-12-13 ENCOUNTER — Telehealth: Payer: Self-pay

## 2018-12-13 NOTE — Telephone Encounter (Signed)
Called pt to schedule AWV and pt declined. Pt stated she had asked if she needed this at a previous apt and was told she does not. FYI! -MM

## 2018-12-16 ENCOUNTER — Telehealth: Payer: Self-pay | Admitting: Family Medicine

## 2018-12-16 NOTE — Telephone Encounter (Signed)
Patient advised she will need ov

## 2018-12-16 NOTE — Telephone Encounter (Signed)
pt is having uti symptoms again,  She has UTI's a lot and has been sick last couple of weeks with colds ect....  She does not want to come in the office with all the other illnesses going on .  She ask that [place] call her back  [phone]  Thanks teri

## 2018-12-26 ENCOUNTER — Other Ambulatory Visit: Payer: Self-pay | Admitting: Family Medicine

## 2018-12-30 ENCOUNTER — Other Ambulatory Visit: Payer: Self-pay | Admitting: Family Medicine

## 2018-12-31 ENCOUNTER — Encounter: Payer: PPO | Admitting: Family Medicine

## 2019-01-08 ENCOUNTER — Encounter: Payer: PPO | Admitting: Family Medicine

## 2019-01-30 ENCOUNTER — Telehealth: Payer: Self-pay

## 2019-01-30 NOTE — Telephone Encounter (Signed)
Please review

## 2019-01-30 NOTE — Telephone Encounter (Signed)
Patient called requesting a prescription for cloprimazole betamethazone diprotionate cream 1%/0 .05%  She says that Dr. Rosanna Randy prescribed this in the past for itching of the tailbone. She doesn't remember when it was  last prescribed it, but says it worked well. She is currently experiencing itching near her tail bone. Pharmacy: [facility]

## 2019-02-04 ENCOUNTER — Encounter: Payer: Self-pay | Admitting: Family Medicine

## 2019-02-04 NOTE — Telephone Encounter (Signed)
Please review. Thanks!  

## 2019-02-04 NOTE — Telephone Encounter (Signed)
Ok to rf once

## 2019-02-05 ENCOUNTER — Other Ambulatory Visit: Payer: Self-pay | Admitting: Family Medicine

## 2019-02-05 MED ORDER — CLOTRIMAZOLE-BETAMETHASONE 1-0.05 % EX CREA: 1.0000 "application " | TOPICAL_CREAM | Freq: Two times a day (BID) | CUTANEOUS | 0 refills | Status: DC

## 2019-02-05 NOTE — Telephone Encounter (Signed)
BID prn,#30gm tube--no rf.

## 2019-02-05 NOTE — Telephone Encounter (Signed)
Can you please give the instructions on this so I can send it in to pharmacy.  dbs

## 2019-02-05 NOTE — Telephone Encounter (Signed)
rx sent to pharmacy

## 2019-02-19 DIAGNOSIS — K210 Gastro-esophageal reflux disease with esophagitis: Secondary | ICD-10-CM | POA: Diagnosis not present

## 2019-02-19 DIAGNOSIS — K222 Esophageal obstruction: Secondary | ICD-10-CM | POA: Diagnosis not present

## 2019-02-19 DIAGNOSIS — K5790 Diverticulosis of intestine, part unspecified, without perforation or abscess without bleeding: Secondary | ICD-10-CM | POA: Diagnosis not present

## 2019-02-19 DIAGNOSIS — R634 Abnormal weight loss: Secondary | ICD-10-CM | POA: Diagnosis not present

## 2019-02-19 DIAGNOSIS — R197 Diarrhea, unspecified: Secondary | ICD-10-CM | POA: Diagnosis not present

## 2019-02-20 DIAGNOSIS — K5790 Diverticulosis of intestine, part unspecified, without perforation or abscess without bleeding: Secondary | ICD-10-CM | POA: Insufficient documentation

## 2019-02-20 DIAGNOSIS — R197 Diarrhea, unspecified: Secondary | ICD-10-CM | POA: Insufficient documentation

## 2019-02-20 DIAGNOSIS — K222 Esophageal obstruction: Secondary | ICD-10-CM | POA: Insufficient documentation

## 2019-03-12 DIAGNOSIS — H2513 Age-related nuclear cataract, bilateral: Secondary | ICD-10-CM | POA: Diagnosis not present

## 2019-04-28 DIAGNOSIS — K210 Gastro-esophageal reflux disease with esophagitis: Secondary | ICD-10-CM | POA: Diagnosis not present

## 2019-04-28 DIAGNOSIS — E7800 Pure hypercholesterolemia, unspecified: Secondary | ICD-10-CM | POA: Diagnosis not present

## 2019-04-28 DIAGNOSIS — K222 Esophageal obstruction: Secondary | ICD-10-CM | POA: Diagnosis not present

## 2019-04-28 DIAGNOSIS — R197 Diarrhea, unspecified: Secondary | ICD-10-CM | POA: Diagnosis not present

## 2019-04-28 DIAGNOSIS — R5383 Other fatigue: Secondary | ICD-10-CM | POA: Diagnosis not present

## 2019-04-30 DIAGNOSIS — E7800 Pure hypercholesterolemia, unspecified: Secondary | ICD-10-CM | POA: Diagnosis not present

## 2019-04-30 DIAGNOSIS — R7989 Other specified abnormal findings of blood chemistry: Secondary | ICD-10-CM | POA: Diagnosis not present

## 2019-04-30 DIAGNOSIS — R197 Diarrhea, unspecified: Secondary | ICD-10-CM | POA: Diagnosis not present

## 2019-04-30 DIAGNOSIS — D649 Anemia, unspecified: Secondary | ICD-10-CM | POA: Diagnosis not present

## 2019-04-30 DIAGNOSIS — R5383 Other fatigue: Secondary | ICD-10-CM | POA: Diagnosis not present

## 2019-05-01 DIAGNOSIS — E7800 Pure hypercholesterolemia, unspecified: Secondary | ICD-10-CM | POA: Diagnosis not present

## 2019-05-01 DIAGNOSIS — R7989 Other specified abnormal findings of blood chemistry: Secondary | ICD-10-CM | POA: Diagnosis not present

## 2019-05-01 DIAGNOSIS — D649 Anemia, unspecified: Secondary | ICD-10-CM | POA: Diagnosis not present

## 2019-05-01 DIAGNOSIS — R5383 Other fatigue: Secondary | ICD-10-CM | POA: Diagnosis not present

## 2019-05-01 DIAGNOSIS — R197 Diarrhea, unspecified: Secondary | ICD-10-CM | POA: Diagnosis not present

## 2019-05-06 ENCOUNTER — Other Ambulatory Visit: Payer: Self-pay | Admitting: Nurse Practitioner

## 2019-05-06 DIAGNOSIS — R197 Diarrhea, unspecified: Secondary | ICD-10-CM

## 2019-05-06 DIAGNOSIS — R1084 Generalized abdominal pain: Secondary | ICD-10-CM

## 2019-05-06 DIAGNOSIS — R700 Elevated erythrocyte sedimentation rate: Secondary | ICD-10-CM

## 2019-05-08 ENCOUNTER — Other Ambulatory Visit: Payer: Self-pay

## 2019-05-15 ENCOUNTER — Other Ambulatory Visit: Payer: Self-pay

## 2019-05-15 ENCOUNTER — Ambulatory Visit
Admission: RE | Admit: 2019-05-15 | Discharge: 2019-05-15 | Disposition: A | Discharge status: Home / Self Care | Payer: PPO | Source: Ambulatory Visit | Attending: Nurse Practitioner | Admitting: Nurse Practitioner

## 2019-05-15 DIAGNOSIS — R197 Diarrhea, unspecified: Secondary | ICD-10-CM | POA: Insufficient documentation

## 2019-05-15 DIAGNOSIS — R1084 Generalized abdominal pain: Secondary | ICD-10-CM | POA: Diagnosis not present

## 2019-05-15 DIAGNOSIS — R700 Elevated erythrocyte sedimentation rate: Secondary | ICD-10-CM | POA: Diagnosis not present

## 2019-05-15 MED ORDER — IOHEXOL 300 MG/ML  SOLN
85.0000 mL | Freq: Once | INTRAMUSCULAR | Status: AC | PRN
  Administered 2019-05-15: 85 mL via INTRAVENOUS

## 2019-06-10 DIAGNOSIS — R197 Diarrhea, unspecified: Secondary | ICD-10-CM | POA: Diagnosis not present

## 2019-06-13 ENCOUNTER — Other Ambulatory Visit: Payer: Self-pay | Admitting: Family Medicine

## 2019-06-17 ENCOUNTER — Other Ambulatory Visit: Payer: Self-pay | Admitting: Family Medicine

## 2019-06-17 DIAGNOSIS — Z1231 Encounter for screening mammogram for malignant neoplasm of breast: Secondary | ICD-10-CM

## 2019-06-20 DIAGNOSIS — R197 Diarrhea, unspecified: Secondary | ICD-10-CM | POA: Diagnosis not present

## 2019-06-23 DIAGNOSIS — R197 Diarrhea, unspecified: Secondary | ICD-10-CM | POA: Diagnosis not present

## 2019-06-26 DIAGNOSIS — E164 Increased secretion of gastrin: Secondary | ICD-10-CM | POA: Diagnosis not present

## 2019-07-04 ENCOUNTER — Other Ambulatory Visit
Admission: RE | Admit: 2019-07-04 | Discharge: 2019-07-04 | Disposition: A | Discharge status: Home / Self Care | Payer: PPO | Source: Ambulatory Visit | Attending: Internal Medicine | Admitting: Internal Medicine

## 2019-07-04 DIAGNOSIS — Z20828 Contact with and (suspected) exposure to other viral communicable diseases: Secondary | ICD-10-CM | POA: Diagnosis not present

## 2019-07-04 DIAGNOSIS — Z01812 Encounter for preprocedural laboratory examination: Secondary | ICD-10-CM | POA: Insufficient documentation

## 2019-07-05 LAB — SARS CORONAVIRUS 2 (TAT 6-24 HRS): SARS Coronavirus 2: NEGATIVE

## 2019-07-08 ENCOUNTER — Encounter: Payer: Self-pay | Admitting: *Deleted

## 2019-07-09 ENCOUNTER — Ambulatory Visit
Admission: RE | Admit: 2019-07-09 | Discharge: 2019-07-09 | Disposition: A | Discharge status: Home / Self Care | Payer: PPO | Attending: Internal Medicine | Admitting: Internal Medicine

## 2019-07-09 ENCOUNTER — Ambulatory Visit: Payer: PPO | Admitting: Anesthesiology

## 2019-07-09 ENCOUNTER — Encounter
Admission: RE | Disposition: A | Discharge status: Home / Self Care | Payer: Self-pay | Source: Home / Self Care | Attending: Internal Medicine

## 2019-07-09 ENCOUNTER — Encounter: Payer: Self-pay | Admitting: *Deleted

## 2019-07-09 DIAGNOSIS — I509 Heart failure, unspecified: Secondary | ICD-10-CM | POA: Insufficient documentation

## 2019-07-09 DIAGNOSIS — K640 First degree hemorrhoids: Secondary | ICD-10-CM | POA: Diagnosis not present

## 2019-07-09 DIAGNOSIS — Z86010 Personal history of colonic polyps: Secondary | ICD-10-CM | POA: Diagnosis not present

## 2019-07-09 DIAGNOSIS — K5790 Diverticulosis of intestine, part unspecified, without perforation or abscess without bleeding: Secondary | ICD-10-CM | POA: Diagnosis not present

## 2019-07-09 DIAGNOSIS — Z79899 Other long term (current) drug therapy: Secondary | ICD-10-CM | POA: Insufficient documentation

## 2019-07-09 DIAGNOSIS — K219 Gastro-esophageal reflux disease without esophagitis: Secondary | ICD-10-CM | POA: Insufficient documentation

## 2019-07-09 DIAGNOSIS — K591 Functional diarrhea: Secondary | ICD-10-CM | POA: Insufficient documentation

## 2019-07-09 DIAGNOSIS — M1990 Unspecified osteoarthritis, unspecified site: Secondary | ICD-10-CM | POA: Diagnosis not present

## 2019-07-09 DIAGNOSIS — I5032 Chronic diastolic (congestive) heart failure: Secondary | ICD-10-CM | POA: Diagnosis not present

## 2019-07-09 DIAGNOSIS — K5730 Diverticulosis of large intestine without perforation or abscess without bleeding: Secondary | ICD-10-CM | POA: Insufficient documentation

## 2019-07-09 DIAGNOSIS — G629 Polyneuropathy, unspecified: Secondary | ICD-10-CM | POA: Insufficient documentation

## 2019-07-09 DIAGNOSIS — F329 Major depressive disorder, single episode, unspecified: Secondary | ICD-10-CM | POA: Insufficient documentation

## 2019-07-09 DIAGNOSIS — I110 Hypertensive heart disease with heart failure: Secondary | ICD-10-CM | POA: Insufficient documentation

## 2019-07-09 DIAGNOSIS — R197 Diarrhea, unspecified: Secondary | ICD-10-CM | POA: Diagnosis not present

## 2019-07-09 DIAGNOSIS — K648 Other hemorrhoids: Secondary | ICD-10-CM | POA: Diagnosis not present

## 2019-07-09 DIAGNOSIS — E7800 Pure hypercholesterolemia, unspecified: Secondary | ICD-10-CM | POA: Diagnosis not present

## 2019-07-09 HISTORY — PX: COLONOSCOPY WITH PROPOFOL: SHX5780

## 2019-07-09 SURGERY — COLONOSCOPY WITH PROPOFOL
Anesthesia: General

## 2019-07-09 MED ORDER — LIDOCAINE HCL (PF) 2 % IJ SOLN
INTRAMUSCULAR | Status: AC
  Filled 2019-07-09: qty 10

## 2019-07-09 MED ORDER — LIDOCAINE HCL (CARDIAC) PF 100 MG/5ML IV SOSY
PREFILLED_SYRINGE | INTRAVENOUS | Status: DC | PRN
  Administered 2019-07-09: 100 mg via INTRAVENOUS

## 2019-07-09 MED ORDER — PROPOFOL 500 MG/50ML IV EMUL
INTRAVENOUS | Status: DC | PRN
  Administered 2019-07-09: 100 ug/kg/min via INTRAVENOUS

## 2019-07-09 MED ORDER — PROPOFOL 10 MG/ML IV BOLUS
INTRAVENOUS | Status: DC | PRN
  Administered 2019-07-09 (×2): 20 mg via INTRAVENOUS
  Administered 2019-07-09: 60 mg via INTRAVENOUS

## 2019-07-09 MED ORDER — ONDANSETRON HCL 4 MG/2ML IJ SOLN
INTRAMUSCULAR | Status: AC
  Filled 2019-07-09: qty 2

## 2019-07-09 MED ORDER — DEXAMETHASONE SODIUM PHOSPHATE 4 MG/ML IJ SOLN
INTRAMUSCULAR | Status: AC
  Filled 2019-07-09: qty 1

## 2019-07-09 MED ORDER — ONDANSETRON HCL 4 MG/2ML IJ SOLN
INTRAMUSCULAR | Status: DC | PRN
  Administered 2019-07-09: 4 mg via INTRAVENOUS

## 2019-07-09 MED ORDER — DEXAMETHASONE SODIUM PHOSPHATE 10 MG/ML IJ SOLN
INTRAMUSCULAR | Status: DC | PRN
  Administered 2019-07-09: 4 mg via INTRAVENOUS

## 2019-07-09 MED ORDER — SODIUM CHLORIDE 0.9 % IV SOLN
INTRAVENOUS | Status: DC
  Administered 2019-07-09: 14:00:00 via INTRAVENOUS

## 2019-07-09 MED ORDER — GLYCOPYRROLATE 0.2 MG/ML IJ SOLN
INTRAMUSCULAR | Status: DC | PRN
  Administered 2019-07-09: 0.2 mg via INTRAVENOUS

## 2019-07-09 MED ORDER — ATROPINE SULFATE 0.4 MG/ML IJ SOLN
INTRAMUSCULAR | Status: DC | PRN
  Administered 2019-07-09: .3 mg via INTRAVENOUS

## 2019-07-09 MED ORDER — GLYCOPYRROLATE 0.2 MG/ML IJ SOLN
INTRAMUSCULAR | Status: AC
  Filled 2019-07-09: qty 1

## 2019-07-09 MED ORDER — ATROPINE SULFATE 1 MG/10ML IJ SOSY
PREFILLED_SYRINGE | INTRAMUSCULAR | Status: AC
  Filled 2019-07-09: qty 10

## 2019-07-09 NOTE — Op Note (Signed)
[facility] Gastroenterology Patient Name: Jessica Armstrong Procedure Date: 07/09/2019 2:19 PM MRN: [id number] Account #: [id number] Date of Birth: [birth year] Admit Type: Outpatient Age: 73 Room: [facility] ENDO ROOM 3 Gender: Female Note Status: Finalized Procedure:            Colonoscopy Indications:          Functional diarrhea Providers:            Benay Pike. Alice Reichert MD, MD Referring MD:         Janine Ores. Rosanna Randy, MD (Referring MD) Medicines:            Propofol per Anesthesia Complications:        No immediate complications. Procedure:            Pre-Anesthesia Assessment:                       - After reviewing the risks and benefits, the patient                        was deemed in satisfactory condition to undergo the                        procedure.                       - ASA Grade Assessment: III - A patient with severe                        systemic disease.                       After obtaining informed consent, the colonoscope was                        passed under direct vision. Throughout the procedure,                        the patient's blood pressure, pulse, and oxygen                        saturations were monitored continuously. The                        Colonoscope was introduced through the anus and                        advanced to the the cecum, identified by appendiceal                        orifice and ileocecal valve. The patient tolerated the                        procedure well. The colonoscopy was technically                        difficult and complex due to multiple diverticula in                        the colon. Successful completion of the procedure was  aided by withdrawing the scope and replacing with the                        pediatric colonoscope. The patient tolerated the                        procedure well. The quality of the bowel preparation                        was good. Findings:      The  perianal and digital rectal examinations were normal. Pertinent       negatives include normal sphincter tone and no palpable rectal lesions.      Multiple small and large-mouthed diverticula were found in the sigmoid       colon.      Normal mucosa was found in the entire colon. Biopsies for histology were       taken with a cold forceps from the random colon for evaluation of       microscopic colitis.      Non-bleeding internal hemorrhoids were found during retroflexion. The       hemorrhoids were Grade I (internal hemorrhoids that do not prolapse).      Non-bleeding internal hemorrhoids were found during retroflexion. The       hemorrhoids were Grade I (internal hemorrhoids that do not prolapse).      The exam was otherwise without abnormality. Impression:           - Diverticulosis in the sigmoid colon.                       - Normal mucosa in the entire examined colon. Biopsied.                       - Non-bleeding internal hemorrhoids.                       - Non-bleeding internal hemorrhoids.                       - The examination was otherwise normal. Recommendation:       - Patient has a contact number available for                        emergencies. The signs and symptoms of potential                        delayed complications were discussed with the patient.                        Return to normal activities tomorrow. Written discharge                        instructions were provided to the patient.                       - Resume previous diet.                       - Continue present medications.                       - Await pathology results.                       -  Return to nurse practitioner in 6 weeks.                       - The findings and recommendations were discussed with                        the patient.                       - NO repeat colonoscopy due to age/comorbid status Procedure Code(s):    --- Professional ---                       [id number],  Colonoscopy, flexible; with biopsy, single or                        multiple Diagnosis Code(s):    --- Professional ---                       K57.30, Diverticulosis of large intestine without                        perforation or abscess without bleeding                       K59.1, Functional diarrhea                       K64.0, First degree hemorrhoids CPT copyright 2019 [employer]. All rights reserved. The codes documented in this report are preliminary and upon coder review may  be revised to meet current compliance requirements. Efrain Sella MD, MD 07/09/2019 2:57:28 PM This report has been signed electronically. Number of Addenda: 0 Note Initiated On: 07/09/2019 2:19 PM Scope Withdrawal Time: 0 hours 5 minutes 34 seconds  Total Procedure Duration: 0 hours 18 minutes 33 seconds  Estimated Blood Loss: Estimated blood loss: none.      [facility]

## 2019-07-09 NOTE — H&P (Signed)
Outpatient short stay form Pre-procedure 07/09/2019 2:19 PM Teodoro K. Alice Reichert, M.D.  Primary Physician: Miguel Aschoff, M.D.  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 No formal diagnosis of Microscopic colitis has been made, however. No significant GI bleeding such as melena or hematochezia. CT enterography performed on 05/15/2019 was unremarkable for any pathology, sigmoid diverticulosis was noted.     Current Facility-Administered Medications:  .  0.9 %  sodium chloride infusion, , Intravenous, Continuous, Garner, Benay Pike, MD, Last Rate: 20 mL/hr at 07/09/19 1348  Medications Prior to Admission  Medication Sig Dispense Refill Last Dose  . Cholecalciferol (D3 ADULT PO) Take 5,000 Units by mouth daily.   Past Week at Unknown time  . diazepam (VALIUM) 2 MG tablet Take 1 tablet (2 mg total) by mouth every 12 (twelve) hours as needed for anxiety. (Patient taking differently: Take 2 mg by mouth 3 (three) times daily as needed for anxiety. ) 60 tablet 0 Past Week at Unknown time  . hydrALAZINE (APRESOLINE) 50 MG tablet TAKE ONE TABLET 3 TIMES DAILY 270 tablet 3 07/08/2019 at Unknown time  . meclizine (ANTIVERT) 25 MG tablet Take 1 tablet (25 mg total) by mouth 2 (two) times daily as needed for dizziness. 60 tablet 2 Past Month at Unknown time  . metoprolol succinate (TOPROL-XL) 25 MG 24 hr tablet TAKE ONE TABLET EVERY DAY 90 tablet 3 07/08/2019 at Unknown time  . Multiple Vitamin (MULTIVITAMIN) tablet Take 1 tablet by mouth daily.   Past Week at Unknown time  . pantoprazole (PROTONIX) 40 MG tablet TAKE ONE TABLET BY MOUTH TWICE DAILY 180 tablet 3 Past Month at Unknown time  . triamterene-hydrochlorothiazide (DYAZIDE) 37.5-25 MG capsule TAKE 1 CAPSULE EVERY DAY 90 capsule 3 07/08/2019 at Unknown time  . verapamil (CALAN-SR) 180  MG CR tablet TAKE ONE TABLET BY MOUTH TWICE DAILY (Patient taking differently: Take 90 mg by mouth daily. ) 180 tablet 3 07/08/2019 at Unknown time  . albuterol (PROAIR HFA) 108 (90 Base) MCG/ACT inhaler Inhale 1 puff into the lungs every 4 (four) hours as needed for wheezing or shortness of breath. (Patient not taking: Reported on 11/04/2018) 3 Inhaler 3   . Cinnamon 500 MG capsule Take 500 mg by mouth daily.   Not Taking at Unknown time  . clotrimazole-betamethasone (LOTRISONE) cream Apply 1 application topically 2 (two) times daily. (Patient not taking: Reported on 07/09/2019) 30 g 0 Not Taking at Unknown time  . docusate sodium (COLACE) 50 MG capsule Take 50 mg by mouth daily.   Not Taking at Unknown time  . HYDROcodone-acetaminophen (NORCO) 5-325 MG tablet Take 1 tablet by mouth every 6 (six) hours as needed for up to 7 doses for severe pain. (Patient not taking: Reported on 11/21/2018) 7 tablet 0   . loratadine (CLARITIN) 10 MG tablet Take 1 tablet (10 mg total) by mouth daily. (Patient not taking: Reported on 10/16/2018) 90 tablet 3   . ondansetron (ZOFRAN ODT) 4 MG disintegrating tablet Take 1 tablet (4 mg total) by mouth every 8 (eight) hours as needed for nausea or vomiting. (Patient not taking: Reported on 10/16/2018) 20 tablet 0   . PARoxetine (PAXIL) 20 MG tablet TAKE ONE TABLET EVERY DAY (Patient not taking: Reported on 07/09/2019) 90 tablet 3 Not Taking at Unknown time  . Red Yeast Rice Extract (RED YEAST RICE PO) Take 1 tablet  by mouth daily.    Not Taking at Unknown time     Allergies  Allergen Reactions  . Cholestyramine Nausea And Vomiting  . Ciprofloxacin Nausea And Vomiting  . Codeine Sulfate Nausea And Vomiting  . Keflex [Cephalexin] Nausea And Vomiting  . Macrobid [Nitrofurantoin Macrocrystal] Nausea And Vomiting  . Sulfa Antibiotics Nausea And Vomiting and Rash     Past Medical History:  Diagnosis Date  . Anemia    mild; no medical treatment  . Arthritis   . Carotid artery  stenosis    a. doppler [id number]: RICA 123456, LICA 123456 f/u 1 year  . Cervical spondyloarthritis   . Chronic diastolic CHF (congestive heart failure) ([place])    a. echo 04/2015: EF 55-60%, no RWMA, mitral valve w/ systolic bowing w/o prolapse, PASP 53 mmHg  . Claudication ([place])   . Colon polyp   . Cystitis   . Depression    situational  . Dyspnea   . GERD (gastroesophageal reflux disease)   . H/O vertigo   . History of measles   . History of mumps   . Hypercholesterolemia   . Hyperglycemia   . Hypertension   . Need for SBE (subacute bacterial endocarditis) prophylaxis   . Neuropathy   . Palpitations   . PONV (postoperative nausea and vomiting)    severe n/v after anesthesia  . Post-menopausal   . Pulmonary valve dysplasia   . Rotator cuff tear, right   . Seasonal allergies   . TMJ (dislocation of temporomandibular joint)   . Tricuspid regurgitation    a. echo 04/2013: EF 55-60%, nl wall motion, mild to mod TR, PASP 35 mm Hg    Review of systems:  Otherwise negative.    Physical Exam  Gen: Alert, oriented. Appears stated age.  HEENT: [place]/AT. PERRLA. Lungs: CTA, no wheezes. CV: RR nl S1, S2. Abd: soft, benign, no masses. BS+ Ext: No edema. Pulses 2+    Planned procedures: Proceed with colonoscopy with random colon biopsies. The patient understands the nature of the planned procedure, indications, risks, alternatives and potential complications including but not limited to bleeding, infection, perforation, damage to internal organs and possible oversedation/side effects from anesthesia. The patient agrees and gives consent to proceed.  Please refer to procedure notes for findings, recommendations and patient disposition/instructions.     Teodoro K. Alice Reichert, M.D. Gastroenterology 07/09/2019  2:19 PM

## 2019-07-09 NOTE — Transfer of Care (Signed)
Immediate Anesthesia Transfer of Care Note  [name]  Procedure(s) Performed: COLONOSCOPY WITH PROPOFOL (N/A )  Patient Location: PACU and Endoscopy Unit  Anesthesia Type:General  Level of Consciousness: awake, drowsy and patient cooperative  Airway & Oxygen Therapy: Patient Spontanous Breathing  Post-op Assessment: Report given to RN and Post -op Vital signs reviewed and stable  Post vital signs: Reviewed and stable  Last Vitals:  Vitals Value Taken Time  BP 145/76 07/09/19 1457  Temp    Pulse 94 07/09/19 1458  Resp 23 07/09/19 1458  SpO2 97 % 07/09/19 1458  Vitals shown include unvalidated device data.  Last Pain:  Vitals:   07/09/19 1317  TempSrc: Tympanic  PainSc: 0-No pain         Complications: No apparent anesthesia complications

## 2019-07-09 NOTE — Anesthesia Post-op Follow-up Note (Signed)
Anesthesia QCDR form completed.        

## 2019-07-09 NOTE — Interval H&P Note (Signed)
History and Physical Interval Note:  07/09/2019 2:22 PM  Jessica [facility]  has presented today for surgery, with the diagnosis of DIARRHEA.  The various methods of treatment have been discussed with the patient and family. After consideration of risks, benefits and other options for treatment, the patient has consented to  Procedure(s): COLONOSCOPY WITH PROPOFOL (N/A) as a surgical intervention.  The patient's history has been reviewed, patient examined, no change in status, stable for surgery.  I have reviewed the patient's chart and labs.  Questions were answered to the patient's satisfaction.     Doctor Phillips, [place]

## 2019-07-09 NOTE — Anesthesia Preprocedure Evaluation (Addendum)
Anesthesia Evaluation  Patient identified by MRN, date of birth, ID band Patient awake    Reviewed: Allergy & Precautions, NPO status , Patient's Chart, lab work & pertinent test results  History of Anesthesia Complications (+) PONV and history of anesthetic complications  Airway Mallampati: III  TM Distance: >3 FB Neck ROM: Full    Dental  (+) Partial Lower   Pulmonary neg pulmonary ROS, neg sleep apnea, neg COPD,    breath sounds clear to auscultation- rhonchi (-) wheezing      Cardiovascular hypertension, Pt. on medications +CHF (preserved EF)  (-) CAD, (-) Past MI, (-) Cardiac Stents and (-) CABG  Rhythm:Regular Rate:Normal - Systolic murmurs and - Diastolic murmurs    Neuro/Psych neg Seizures PSYCHIATRIC DISORDERS Anxiety Depression negative neurological ROS     GI/Hepatic Neg liver ROS, GERD  ,  Endo/Other  negative endocrine ROSneg diabetes  Renal/GU negative Renal ROS     Musculoskeletal  (+) Arthritis ,   Abdominal (+) - obese,   Peds  Hematology  (+) anemia ,   Anesthesia Other Findings Past Medical History: No date: Anemia     Comment:  mild; no medical treatment No date: Arthritis No date: Carotid artery stenosis     Comment:  a. doppler [id number]: RICA 123456, LICA 123456 f/u 1 year No date: Cervical spondyloarthritis No date: Chronic diastolic CHF (congestive heart failure) ([facility])     Comment:  a. echo 04/2015: EF 55-60%, no RWMA, mitral valve w/               systolic bowing w/o prolapse, PASP 53 mmHg No date: Claudication ([facility]) No date: Colon polyp No date: Cystitis No date: Depression     Comment:  situational No date: Dyspnea No date: GERD (gastroesophageal reflux disease) No date: H/O vertigo No date: History of measles No date: History of mumps No date: Hypercholesterolemia No date: Hyperglycemia No date: Hypertension No date: Need for SBE (subacute bacterial endocarditis)  prophylaxis No date: Neuropathy No date: Palpitations No date: PONV (postoperative nausea and vomiting)     Comment:  severe n/v after anesthesia No date: Post-menopausal No date: Pulmonary valve dysplasia No date: Rotator cuff tear, right No date: Seasonal allergies No date: TMJ (dislocation of temporomandibular joint) No date: Tricuspid regurgitation     Comment:  a. echo 04/2013: EF 55-60%, nl wall motion, mild to mod               TR, PASP 35 mm Hg   Reproductive/Obstetrics                            Anesthesia Physical Anesthesia Plan  ASA: III  Anesthesia Plan: General   Post-op Pain Management:    Induction: Intravenous  PONV Risk Score and Plan: 3 and Propofol infusion  Airway Management Planned: Natural Airway  Additional Equipment:   Intra-op Plan:   Post-operative Plan:   Informed Consent: I have reviewed the patients History and Physical, chart, labs and discussed the procedure including the risks, benefits and alternatives for the proposed anesthesia with the patient or authorized representative who has indicated his/her understanding and acceptance.     Dental advisory given  Plan Discussed with: CRNA and Anesthesiologist  Anesthesia Plan Comments:         Anesthesia Quick Evaluation

## 2019-07-11 ENCOUNTER — Encounter: Payer: Self-pay | Admitting: Internal Medicine

## 2019-07-11 LAB — SURGICAL PATHOLOGY

## 2019-07-11 NOTE — Anesthesia Postprocedure Evaluation (Addendum)
Anesthesia Post Note  [name]  Procedure(s) Performed: COLONOSCOPY WITH PROPOFOL (N/A )  Patient location during evaluation: Endoscopy Anesthesia Type: General Level of consciousness: awake and alert and oriented Pain management: pain level controlled Vital Signs Assessment: post-procedure vital signs reviewed and stable Respiratory status: spontaneous breathing, nonlabored ventilation and respiratory function stable Cardiovascular status: blood pressure returned to baseline and stable Postop Assessment: no signs of nausea or vomiting Anesthetic complications: no     Last Vitals:  Vitals:   07/09/19 1459 07/09/19 1509  BP: (!) 145/76 132/66  Pulse: 93 90  Resp: (!) 25 20  Temp: (!) 36.4 C   SpO2: 98% 97%    Last Pain:  Vitals:   07/10/19 0740  TempSrc:   PainSc: 0-No pain                 Amillya Chavira

## 2019-07-14 DIAGNOSIS — K2100 Gastro-esophageal reflux disease with esophagitis, without bleeding: Secondary | ICD-10-CM | POA: Diagnosis not present

## 2019-07-14 DIAGNOSIS — R197 Diarrhea, unspecified: Secondary | ICD-10-CM | POA: Diagnosis not present

## 2019-07-15 DIAGNOSIS — K5790 Diverticulosis of intestine, part unspecified, without perforation or abscess without bleeding: Secondary | ICD-10-CM | POA: Diagnosis not present

## 2019-07-15 DIAGNOSIS — K2100 Gastro-esophageal reflux disease with esophagitis, without bleeding: Secondary | ICD-10-CM | POA: Diagnosis not present

## 2019-07-15 DIAGNOSIS — K529 Noninfective gastroenteritis and colitis, unspecified: Secondary | ICD-10-CM | POA: Insufficient documentation

## 2019-07-15 DIAGNOSIS — E164 Increased secretion of gastrin: Secondary | ICD-10-CM | POA: Insufficient documentation

## 2019-07-30 ENCOUNTER — Ambulatory Visit (INDEPENDENT_AMBULATORY_CARE_PROVIDER_SITE_OTHER): Payer: PPO | Admitting: Family Medicine

## 2019-07-30 ENCOUNTER — Encounter: Payer: Self-pay | Admitting: Family Medicine

## 2019-07-30 ENCOUNTER — Other Ambulatory Visit: Payer: Self-pay

## 2019-07-30 VITALS — BP 108/68 | HR 60 | Temp 98.5°F | Resp 16 | Wt 147.0 lb

## 2019-07-30 DIAGNOSIS — R1030 Lower abdominal pain, unspecified: Secondary | ICD-10-CM

## 2019-07-30 DIAGNOSIS — F419 Anxiety disorder, unspecified: Secondary | ICD-10-CM

## 2019-07-30 DIAGNOSIS — K5790 Diverticulosis of intestine, part unspecified, without perforation or abscess without bleeding: Secondary | ICD-10-CM

## 2019-07-30 DIAGNOSIS — K529 Noninfective gastroenteritis and colitis, unspecified: Secondary | ICD-10-CM | POA: Diagnosis not present

## 2019-07-30 DIAGNOSIS — K219 Gastro-esophageal reflux disease without esophagitis: Secondary | ICD-10-CM

## 2019-07-30 NOTE — Progress Notes (Signed)
[name] Female    DOB: [birth year]   73 y.o.   MRN: [id number] Visit Date: 07/30/2019  Today's Provider: Wilhemena Durie, MD   Chief Complaint  Patient presents with  . wants to discuss labs   Subjective:   HPI Patient comes in today wanting to discuss lab results that she received from her Gastroenterologist. Since she was last seen in our office, she reports that she has had 2 colonoscopies and 2 abdominal CT scans. Her last visit with [place] was about 6-7 months ago. She reports that her diarrhea has improved, but she still has the pain.  She evidently has an elevated chromogranin A level and an elevated gastrin level  BP Readings from Last 3 Encounters:  07/30/19 108/68  07/09/19 132/66  11/21/18 126/78   Wt Readings from Last 3 Encounters:  07/30/19 147 lb (66.7 kg)  07/09/19 144 lb (65.3 kg)  11/21/18 146 lb (66.2 kg)    Allergies  Allergen Reactions  . Cholestyramine Nausea And Vomiting  . Ciprofloxacin Nausea And Vomiting  . Codeine Sulfate Nausea And Vomiting  . Keflex [Cephalexin] Nausea And Vomiting  . Macrobid [Nitrofurantoin Macrocrystal] Nausea And Vomiting  . Sulfa Antibiotics Nausea And Vomiting and Rash     Current Outpatient Medications:  .  Cholecalciferol (D3 ADULT PO), Take 5,000 Units by mouth daily., Disp: , Rfl:  .  Cinnamon 500 MG capsule, Take 500 mg by mouth daily., Disp: , Rfl:  .  diazepam (VALIUM) 2 MG tablet, Take 1 tablet (2 mg total) by mouth every 12 (twelve) hours as needed for anxiety., Disp: 60 tablet, Rfl: 0 .  docusate sodium (COLACE) 50 MG capsule, Take 50 mg by mouth daily., Disp: , Rfl:  .  hydrALAZINE (APRESOLINE) 50 MG tablet, TAKE ONE TABLET 3 TIMES DAILY, Disp: 270 tablet, Rfl: 3 .  meclizine (ANTIVERT) 25 MG tablet, Take 1 tablet (25 mg total) by mouth 2 (two) times daily as needed for dizziness., Disp: 60 tablet, Rfl: 2 .  metoprolol succinate (TOPROL-XL) 25 MG 24 hr tablet, TAKE ONE TABLET EVERY DAY,  Disp: 90 tablet, Rfl: 3 .  Multiple Vitamin (MULTIVITAMIN) tablet, Take 1 tablet by mouth daily., Disp: , Rfl:  .  pantoprazole (PROTONIX) 40 MG tablet, TAKE ONE TABLET BY MOUTH TWICE DAILY, Disp: 180 tablet, Rfl: 3 .  Red Yeast Rice Extract (RED YEAST RICE PO), Take 1 tablet by mouth daily. , Disp: , Rfl:  .  triamterene-hydrochlorothiazide (DYAZIDE) 37.5-25 MG capsule, TAKE 1 CAPSULE EVERY DAY, Disp: 90 capsule, Rfl: 3 .  verapamil (CALAN-SR) 180 MG CR tablet, TAKE ONE TABLET BY MOUTH TWICE DAILY (Patient taking differently: Take 90 mg by mouth daily. ), Disp: 180 tablet, Rfl: 3 .  albuterol (PROAIR HFA) 108 (90 Base) MCG/ACT inhaler, Inhale 1 puff into the lungs every 4 (four) hours as needed for wheezing or shortness of breath. (Patient not taking: Reported on 11/04/2018), Disp: 3 Inhaler, Rfl: 3 .  clotrimazole-betamethasone (LOTRISONE) cream, Apply 1 application topically 2 (two) times daily. (Patient not taking: Reported on 07/09/2019), Disp: 30 g, Rfl: 0 .  HYDROcodone-acetaminophen (NORCO) 5-325 MG tablet, Take 1 tablet by mouth every 6 (six) hours as needed for up to 7 doses for severe pain. (Patient not taking: Reported on 11/21/2018), Disp: 7 tablet, Rfl: 0 .  loratadine (CLARITIN) 10 MG tablet, Take 1 tablet (10 mg total) by mouth daily. (Patient not taking: Reported on 10/16/2018), Disp: 90 tablet, Rfl:  3 .  ondansetron (ZOFRAN ODT) 4 MG disintegrating tablet, Take 1 tablet (4 mg total) by mouth every 8 (eight) hours as needed for nausea or vomiting. (Patient not taking: Reported on 10/16/2018), Disp: 20 tablet, Rfl: 0 .  PARoxetine (PAXIL) 20 MG tablet, TAKE ONE TABLET EVERY DAY (Patient not taking: Reported on 07/09/2019), Disp: 90 tablet, Rfl: 3  Review of Systems  Constitutional: Negative.   HENT: Negative.   Eyes: Negative.   Respiratory: Negative.   Cardiovascular: Negative.   Endocrine: Negative.   Genitourinary: Negative.   Allergic/Immunologic: Negative.   Neurological: Negative.    Hematological: Negative.   Psychiatric/Behavioral: Negative.     Social History   Tobacco Use  . Smoking status: Never Smoker  . Smokeless tobacco: Never Used  . 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 Alcohol use: Yes    Comment: rare use 1-2 drinks per year.       Objective:   BP 108/68   Pulse 60   Temp 98.5 F (36.9 C)   Resp 16   Wt 147 lb (66.7 kg)   SpO2 98%   BMI 23.02 kg/m  Vitals:   07/30/19 1014  BP: 108/68  Pulse: 60  Resp: 16  Temp: 98.5 F (36.9 C)  SpO2: 98%  Weight: 147 lb (66.7 kg)  Body mass index is 23.02 kg/m.   Physical Exam Vitals signs reviewed.  Constitutional:      Appearance: Normal appearance. She is normal weight.  HENT:     Head: Normocephalic and atraumatic.     Right Ear: External ear normal.     Left Ear: External ear normal.     Mouth/Throat:     Pharynx: Oropharynx is clear.  Eyes:     General: No scleral icterus.    Conjunctiva/sclera: Conjunctivae normal.  Cardiovascular:     Rate and Rhythm: Normal rate and regular rhythm.     Pulses: Normal pulses.     Heart sounds: Normal heart sounds.  Pulmonary:     Effort: Pulmonary effort is normal.     Breath sounds: Normal breath sounds.  Abdominal:     General: Bowel sounds are normal.     Palpations: Abdomen is soft. There is no mass.     Tenderness: There is no abdominal tenderness.  Musculoskeletal:        General: No swelling.  Lymphadenopathy:     Cervical: No cervical adenopathy.  Skin:    General: Skin is warm and dry.  Neurological:     General: No focal deficit present.     Mental Status: She is alert and oriented to person, place, and time. Mental status is at baseline.  Psychiatric:        Mood and Affect: Mood normal.        Behavior: Behavior normal.        Thought Content: Thought content normal.        Judgment: Judgment normal.      No results found for any visits on 07/30/19.      Assessment & Plan    1. Chronic diarrhea Markedly improved. - Ambulatory referral to Gastroenterology  2. Lower abdominal pain Also markedly improved - Ambulatory referral to Gastroenterology  3. Anxiety Chronic ongoing issue. More than 50% 25 minute visit spent in counseling or coordination of care  4. Diverticulosis Dr. Alice Reichert was advised patient to avoid antibiotics as he does not think she has had  diverticulitis but just a flare of diverticulosis.  She will follow up with GI  5. Gastroesophageal reflux disease, unspecified whether esophagitis present I think her abnormal lab values may be due to PPI therapy.  Again she will need  follow up with GI     Wilhemena Durie, MD  [facility]

## 2019-08-01 ENCOUNTER — Ambulatory Visit
Admission: RE | Admit: 2019-08-01 | Discharge: 2019-08-01 | Disposition: A | Discharge status: Home / Self Care | Payer: PPO | Source: Ambulatory Visit | Attending: Family Medicine | Admitting: Family Medicine

## 2019-08-01 DIAGNOSIS — Z1231 Encounter for screening mammogram for malignant neoplasm of breast: Secondary | ICD-10-CM | POA: Diagnosis not present

## 2019-09-10 ENCOUNTER — Other Ambulatory Visit: Payer: Self-pay

## 2019-09-10 ENCOUNTER — Ambulatory Visit (INDEPENDENT_AMBULATORY_CARE_PROVIDER_SITE_OTHER): Payer: PPO | Admitting: Gastroenterology

## 2019-09-10 ENCOUNTER — Encounter: Payer: Self-pay | Admitting: Gastroenterology

## 2019-09-10 VITALS — BP 122/68 | HR 61 | Temp 97.5°F | Ht 67.0 in | Wt 145.8 lb

## 2019-09-10 DIAGNOSIS — R197 Diarrhea, unspecified: Secondary | ICD-10-CM

## 2019-09-10 NOTE — Progress Notes (Signed)
Gastroenterology Consultation  Referring Provider:     Jerrol Banana.,* Primary Care Physician:  Jerrol Banana., MD Primary Gastroenterologist:  Dr. Allen Norris     Reason for Consultation:     Diarrhea and abdominal pain        HPI:   Jessica Armstrong is a 73 y.o. y/o female referred for consultation & management of diarrhea and abdominal pain by Dr. Rosanna Randy, Retia Passe., MD.  This patient comes to see me after being referred to [facility] clinic back in February 2017.  The patient has had multiple visits and interactions over the phone with that clinic with multiple procedures for the same issues.  On October 10th 2014 the patient was found to have a tubular adenoma.  The patient had a repeat EGD and colonoscopy on January 14, 2016 with another tubular adenoma found at that time and gastric polyps that were benign.  The patient then again underwent another EGD and colonoscopy on January of this year with the findings of diverticulosis and esophagitis.  The patient was reporting dysphagia at that time.  Eight months later the patient was then subjected to another colonoscopy for the diagnosis of functional diarrhea in September of this year with biopsies showing no abnormalities.  The patient's last communication with her [facility] clinic gastroenterology was October 13 of this year. From the patient's telephone communications it appears that she was offered a visit with one of the physician assistants for follow-up and she refused and stated that she only wanted to see Dr. Alice Reichert.  The office then reported in the chart that they would notify him and get back to her.  It appears that she has not seen Dr. Alice Reichert for follow-up since then.  The patient's last encounter with an office visit with Dr. Alice Reichert was back on October 6 of this year where he recommended stopping pantoprazole and start generic omeprazole as a possible cause for the diarrhea.  The patient did have a CT scan of the abdomen for  the work-up of her abdominal pain in August of this year that showed:  IMPRESSION: 1. No acute findings within the abdomen or pelvis. Unremarkable appearance of the small bowel 2. Sigmoid diverticulosis. 3.  Aortic Atherosclerosis (ICD10-I70.0). 4. Liver and kidney cysts.  The patient did have blood work in September of this year including a chromogranin A that was over 1700 that on repeat the following month came down to 47 which is normal.  The patient's gastrin had also been elevated at 437 that came down to 30 during the same period.  The patient reports that her diarrhea has improved greatly off of the Protonix.  The patient also denies any abdominal pain nausea vomiting fevers or chills.  She also denies any rectal bleeding or Daponte stools.  The patient does have a family history of colon cancer and was told that she needs a colonoscopy every 5 years.  The patient also has been found to have adenomatous polyps in the past.  The patient's main reason for coming today was because she wanted to see a physician and could not see one at the [facility] clinic and was concerned about the chromogranin A level.  She does report the diarrhea is greatly improved.   Past Medical History:  Diagnosis Date  . Anemia    mild; no medical treatment  . Arthritis   . Carotid artery stenosis    a. doppler [id number]: RICA [id number], LICA [id number] f/u 1 year  .  Cervical spondyloarthritis   . Chronic diastolic CHF (congestive heart failure) ([place])    a. echo 04/2015: EF 55-60%, no RWMA, mitral valve w/ systolic bowing w/o prolapse, PASP 53 mmHg  . Claudication ([place])   . Colon polyp   . Cystitis   . Depression    situational  . Dyspnea   . GERD (gastroesophageal reflux disease)   . H/O vertigo   . History of measles   . History of mumps   . Hypercholesterolemia   . Hyperglycemia   . Hypertension   . Need for SBE (subacute bacterial endocarditis) prophylaxis   . Neuropathy   . Palpitations   . PONV  (postoperative nausea and vomiting)    severe n/v after anesthesia  . Post-menopausal   . Pulmonary valve dysplasia   . Rotator cuff tear, right   . Seasonal allergies   . TMJ (dislocation of temporomandibular joint)   . Tricuspid regurgitation    a. echo 04/2013: EF 55-60%, nl wall motion, mild to mod TR, PASP 35 mm Hg    Past Surgical History:  Procedure Laterality Date  . ABDOMINAL HYSTERECTOMY    . ANTERIOR CERVICAL DECOMP/DISCECTOMY FUSION N/A 08/26/2013   Procedure: CERVICAL SIX TO SEVEN ANTERIOR CERVICAL DECOMPRESSION/DISCECTOMY FUSION 1 LEVEL;  Surgeon: Erline Levine, MD;  Location: [facility];  Service: Neurosurgery;  Laterality: N/A;  C6-7 Anterior cervical decompression/diskectomy/fusion  . BREAST BIOPSY Right    neg  . CHOLECYSTECTOMY    . COLONOSCOPY WITH PROPOFOL N/A 01/14/2016   Procedure: COLONOSCOPY WITH PROPOFOL;  Surgeon: Manya Silvas, MD;  Location: [facility] ENDOSCOPY;  Service: Endoscopy;  Laterality: N/A;  . COLONOSCOPY WITH PROPOFOL N/A 11/04/2018   Procedure: COLONOSCOPY WITH PROPOFOL;  Surgeon: Manya Silvas, MD;  Location: [facility] ENDOSCOPY;  Service: Endoscopy;  Laterality: N/A;  . COLONOSCOPY WITH PROPOFOL N/A 07/09/2019   Procedure: COLONOSCOPY WITH PROPOFOL;  Surgeon: Toledo, Benay Pike, MD;  Location: [facility] ENDOSCOPY;  Service: Gastroenterology;  Laterality: N/A;  . ESOPHAGOGASTRODUODENOSCOPY (EGD) WITH PROPOFOL N/A 01/14/2016   Procedure: ESOPHAGOGASTRODUODENOSCOPY (EGD) WITH PROPOFOL;  Surgeon: Manya Silvas, MD;  Location: [facility] ENDOSCOPY;  Service: Endoscopy;  Laterality: N/A;  . ESOPHAGOGASTRODUODENOSCOPY (EGD) WITH PROPOFOL N/A 11/04/2018   Procedure: ESOPHAGOGASTRODUODENOSCOPY (EGD) WITH PROPOFOL;  Surgeon: Manya Silvas, MD;  Location: [facility] ENDOSCOPY;  Service: Endoscopy;  Laterality: N/A;  . EYE SURGERY     benign tumor of eye  . SHOULDER ARTHROSCOPY WITH ROTATOR CUFF REPAIR AND SUBACROMIAL DECOMPRESSION Right 05/31/2017   Procedure: SHOULDER  ARTHROSCOPY WITH SUBACROMIAL DECOMPRESSION AND DISTAL CLAVICAL EXCISION;  Surgeon: Tania Ade, MD;  Location: [facility];  Service: Orthopedics;  Laterality: Right;  SHOULDER ARTHROSCOPY WITH SUBACROMIAL DECOMPRESSION AND DISTAL CLAVICAL EXCISION    Prior to Admission medications   Medication Sig Start Date End Date Taking? Authorizing Provider  albuterol (PROAIR HFA) 108 (90 Base) MCG/ACT inhaler Inhale 1 puff into the lungs every 4 (four) hours as needed for wheezing or shortness of breath. Patient not taking: Reported on 11/04/2018 08/22/18   Jerrol Banana., MD  Cholecalciferol (D3 ADULT PO) Take 5,000 Units by mouth daily.    [provider]  Cinnamon 500 MG capsule Take 500 mg by mouth daily.    [provider]  clotrimazole-betamethasone (LOTRISONE) cream Apply 1 application topically 2 (two) times daily. Patient not taking: Reported on 07/09/2019 02/05/19   Jerrol Banana., MD  diazepam (VALIUM) 2 MG tablet Take 1 tablet (2 mg total) by mouth every 12 (twelve) hours  as needed for anxiety. 10/16/18   Jerrol Banana., MD  docusate sodium (COLACE) 50 MG capsule Take 50 mg by mouth daily.    [provider]  hydrALAZINE (APRESOLINE) 50 MG tablet TAKE ONE TABLET 3 TIMES DAILY 12/30/18   Jerrol Banana., MD  HYDROcodone-acetaminophen [facility]) 5-325 MG tablet Take 1 tablet by mouth every 6 (six) hours as needed for up to 7 doses for severe pain. Patient not taking: Reported on 11/21/2018 11/10/17   Darel Hong, MD  loratadine (CLARITIN) 10 MG tablet Take 1 tablet (10 mg total) by mouth daily. Patient not taking: Reported on 10/16/2018 12/07/15   Jerrol Banana., MD  meclizine (ANTIVERT) 25 MG tablet Take 1 tablet (25 mg total) by mouth 2 (two) times daily as needed for dizziness. 03/22/18   Jerrol Banana., MD  metoprolol succinate (TOPROL-XL) 25 MG 24 hr tablet TAKE ONE TABLET EVERY DAY 12/09/18   Jerrol Banana., MD  Multiple  Vitamin (MULTIVITAMIN) tablet Take 1 tablet by mouth daily.    [provider]  ondansetron (ZOFRAN ODT) 4 MG disintegrating tablet Take 1 tablet (4 mg total) by mouth every 8 (eight) hours as needed for nausea or vomiting. Patient not taking: Reported on 10/16/2018 11/10/17   Darel Hong, MD  pantoprazole (PROTONIX) 40 MG tablet TAKE ONE TABLET BY MOUTH TWICE DAILY 06/13/19   Jerrol Banana., MD  PARoxetine (PAXIL) 20 MG tablet TAKE ONE TABLET EVERY DAY Patient not taking: Reported on 07/09/2019 08/13/18   Jerrol Banana., MD  Red Yeast Rice Extract (RED YEAST RICE PO) Take 1 tablet by mouth daily.     [provider]  triamterene-hydrochlorothiazide (DYAZIDE) 37.5-25 MG capsule TAKE 1 CAPSULE EVERY DAY 12/26/18   Jerrol Banana., MD  verapamil (CALAN-SR) 180 MG CR tablet TAKE ONE TABLET BY MOUTH TWICE DAILY Patient taking differently: Take 90 mg by mouth daily.  09/09/18   Jerrol Banana., MD    Family History  Problem Relation Age of Onset  . Heart disease Mother   . Hypertension Mother   . COPD Mother        was a smoker  . Hypertension Brother   . Colon cancer Father   . Asthma Maternal Grandfather   . Breast cancer Neg Hx      Social History   Tobacco Use  . Smoking status: Never Smoker  . Smokeless tobacco: Never Used  . 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 Alcohol use: Yes    Comment: rare use 1-2 drinks per year.   . Drug use: No    Allergies as of 09/10/2019 - Review Complete 07/30/2019  Allergen Reaction Noted  . Cholestyramine Nausea And Vomiting 10/22/2018  . Ciprofloxacin Nausea And Vomiting 06/15/2015  . Codeine sulfate Nausea And Vomiting 04/07/2013  . Keflex [cephalexin] Nausea And Vomiting 04/07/2013  . Macrobid [nitrofurantoin macrocrystal] Nausea And Vomiting 04/07/2013  . Sulfa antibiotics Nausea And Vomiting and Rash 04/07/2013    Review of Systems:     All systems reviewed and negative except where noted in HPI.   Physical Exam:  There were no vitals taken for this visit. No LMP recorded. Patient has had a hysterectomy. General:   Alert,  Well-developed, well-nourished, pleasant and cooperative in NAD Head:  Normocephalic and atraumatic. Eyes:  Sclera clear, no icterus.   Conjunctiva pink. Ears:  Normal auditory  acuity. Nose:  No deformity, discharge, or lesions. Mouth:  No deformity or lesions,oropharynx pink & moist. Neck:  Supple; no masses or thyromegaly. Lungs:  Respirations even and unlabored.  Clear throughout to auscultation.   No wheezes, crackles, or rhonchi. No acute distress. Heart:  Regular rate and rhythm; no murmurs, clicks, rubs, or gallops. Abdomen:  Normal bowel sounds.  No bruits.  Soft, non-tender and non-distended without masses, hepatosplenomegaly or hernias noted.  No guarding or rebound tenderness.  Negative Carnett sign.   Rectal:  Deferred.  Msk:  Symmetrical without gross deformities.  Good, equal movement & strength bilaterally. Pulses:  Normal pulses noted. Extremities:  No clubbing or edema.  No cyanosis. Neurologic:  Alert and oriented x3;  grossly normal neurologically. Skin:  Intact without significant lesions or rashes.  No jaundice. Lymph Nodes:  No significant cervical adenopathy. Psych:  Alert and cooperative. Normal mood and affect.  Imaging Studies: No results found.  Assessment and Plan:   KESLEIGH SVATEK is a 73 y.o. y/o female who comes in today with a history of diarrhea that has gotten better.  The patient has lost weight while she was having diarrhea but it has stabilized.  She is not having any abdominal pain nausea vomiting fevers or chills at the present time.  The patient likely had diarrhea due to her pantoprazole and is doing well on the omeprazole.  She is concerned about the elevation of the chromogranin A at above 1700 even though it went down.  The patient has been told that we will  check that again today to make sure that it is still down and that the 1700 was an aberration.  The patient has been explained the plan and agrees with it.    Lucilla Lame, MD. [facility] Regal    Note: This dictation was prepared with Dragon dictation along with smaller phrase technology. Any transcriptional errors that result from this process are unintentional.

## 2019-09-12 ENCOUNTER — Other Ambulatory Visit: Payer: Self-pay | Admitting: Family Medicine

## 2019-09-12 DIAGNOSIS — S0300XS Dislocation of jaw, unspecified side, sequela: Secondary | ICD-10-CM

## 2019-09-12 LAB — CHROMOGRANIN A: Chromogranin A (ng/mL): 1670 ng/mL — ABNORMAL HIGH (ref 0.0–101.8)

## 2019-09-15 NOTE — Telephone Encounter (Signed)
Pt is completley out of this meds, she needs to get her bp med refilled.  Please advise  Best number  [phone]

## 2019-09-15 NOTE — Telephone Encounter (Signed)
From [facility] 

## 2019-09-15 NOTE — Addendum Note (Signed)
Addended by: Julieta Bellini on: 09/15/2019 05:09 PM   Modules accepted: Orders

## 2019-09-16 ENCOUNTER — Other Ambulatory Visit: Payer: Self-pay | Admitting: Family Medicine

## 2019-09-16 ENCOUNTER — Other Ambulatory Visit: Payer: Self-pay

## 2019-09-16 ENCOUNTER — Ambulatory Visit: Payer: PPO | Admitting: Gastroenterology

## 2019-09-16 ENCOUNTER — Telehealth: Payer: Self-pay | Admitting: Gastroenterology

## 2019-09-16 DIAGNOSIS — R197 Diarrhea, unspecified: Secondary | ICD-10-CM

## 2019-09-16 NOTE — Telephone Encounter (Signed)
From [facility] 

## 2019-09-16 NOTE — Telephone Encounter (Signed)
-----   Message from Lucilla Lame, MD sent at 09/16/2019  3:27 PM EST ----- Ask if the patient is still taking a PPI. If she is then we should stop it and recheck in 2-3 weeks. If not on a PPI now then needs hematology consult.

## 2019-09-16 NOTE — Telephone Encounter (Signed)
Pt notified of lab results. Pt will stay off of the PPI for 2 weeks and recheck lab.

## 2019-09-16 NOTE — Telephone Encounter (Signed)
Pt left vm she states she just received her test results and states it is extremely high she needs a call from [facility] or Dr. Allen Norris

## 2019-09-16 NOTE — Telephone Encounter (Signed)
Pt need a refill on verapamil 180 mg #180 for 90 day supply. [facility]. Pt had an appt in oct 2020

## 2019-09-17 MED ORDER — DIAZEPAM 2 MG PO TABS: 2.0000 mg | ORAL_TABLET | Freq: Two times a day (BID) | ORAL | 0 refills | Status: DC | PRN

## 2019-09-17 MED ORDER — TRIAMTERENE-HCTZ 37.5-25 MG PO CAPS: 1.0000 | ORAL_CAPSULE | Freq: Every day | ORAL | 3 refills | Status: DC

## 2019-09-17 MED ORDER — HYDRALAZINE HCL 50 MG PO TABS: 50.0000 mg | ORAL_TABLET | Freq: Three times a day (TID) | ORAL | 3 refills | Status: DC

## 2019-09-17 MED ORDER — METOPROLOL SUCCINATE ER 25 MG PO TB24: 25.0000 mg | ORAL_TABLET | Freq: Every day | ORAL | 3 refills | Status: DC

## 2019-09-17 MED ORDER — VERAPAMIL HCL ER 180 MG PO TBCR: 180.0000 mg | EXTENDED_RELEASE_TABLET | Freq: Two times a day (BID) | ORAL | 3 refills | Status: DC

## 2019-09-22 ENCOUNTER — Telehealth: Payer: Self-pay | Admitting: Gastroenterology

## 2019-09-22 NOTE — Telephone Encounter (Signed)
Pt left vm for Ginger she is getting ready for another blood test and needs to know if she needs to come of a medication please call pt

## 2019-09-22 NOTE — Telephone Encounter (Signed)
LVM for pt to return my call.

## 2019-09-24 NOTE — Telephone Encounter (Signed)
Called pt and advised her it is okay for her to continue her Prebiotic/fiber prior to her getting her blood test.

## 2019-10-07 DIAGNOSIS — R197 Diarrhea, unspecified: Secondary | ICD-10-CM | POA: Diagnosis not present

## 2019-10-09 LAB — CHROMOGRANIN A: Chromogranin A (ng/mL): 44.3 ng/mL (ref 0.0–101.8)

## 2019-10-13 ENCOUNTER — Telehealth: Payer: Self-pay

## 2019-10-13 NOTE — Telephone Encounter (Signed)
She can try Pepcid twice a day.

## 2019-10-13 NOTE — Telephone Encounter (Signed)
-----   Message from Lucilla Lame, MD sent at 10/12/2019  9:34 AM EST ----- Let the patient know that her blood test showed that the tumor marker is back to normal.It may be related to her PPI since it goes up when she takes it.

## 2019-10-13 NOTE — Telephone Encounter (Signed)
Pt notified of lab results. Pt needs something for her reflux that will not effect the lab again. She is not able to eat much without the discomfort.

## 2019-10-15 ENCOUNTER — Other Ambulatory Visit: Payer: Self-pay

## 2019-10-15 MED ORDER — FAMOTIDINE 20 MG PO TABS: 20.0000 mg | ORAL_TABLET | Freq: Two times a day (BID) | ORAL | 3 refills | Status: DC

## 2019-10-15 NOTE — Telephone Encounter (Signed)
Pt notified per Dr. Allen Norris, ok to try Pepcid twice a day. Rx sent to pt's pharmacy.

## 2019-10-16 ENCOUNTER — Ambulatory Visit
Admission: RE | Admit: 2019-10-16 | Discharge: 2019-10-16 | Disposition: A | Discharge status: Home / Self Care | Payer: PPO | Source: Ambulatory Visit | Attending: Family Medicine | Admitting: Family Medicine

## 2019-10-16 ENCOUNTER — Other Ambulatory Visit: Payer: Self-pay | Admitting: Family Medicine

## 2019-10-16 ENCOUNTER — Ambulatory Visit (INDEPENDENT_AMBULATORY_CARE_PROVIDER_SITE_OTHER): Payer: PPO | Admitting: Family Medicine

## 2019-10-16 ENCOUNTER — Ambulatory Visit: Admission: RE | Admit: 2019-10-16 | Payer: PPO | Source: Ambulatory Visit | Admitting: *Deleted

## 2019-10-16 ENCOUNTER — Other Ambulatory Visit: Payer: Self-pay

## 2019-10-16 ENCOUNTER — Encounter: Payer: Self-pay | Admitting: Family Medicine

## 2019-10-16 VITALS — Temp 97.1°F | Resp 18 | Ht 67.0 in | Wt 144.6 lb

## 2019-10-16 DIAGNOSIS — M545 Low back pain: Secondary | ICD-10-CM | POA: Insufficient documentation

## 2019-10-16 DIAGNOSIS — S92151A Displaced avulsion fracture (chip fracture) of right talus, initial encounter for closed fracture: Secondary | ICD-10-CM | POA: Diagnosis not present

## 2019-10-16 DIAGNOSIS — S92251A Displaced fracture of navicular [scaphoid] of right foot, initial encounter for closed fracture: Secondary | ICD-10-CM | POA: Diagnosis not present

## 2019-10-16 DIAGNOSIS — R739 Hyperglycemia, unspecified: Secondary | ICD-10-CM

## 2019-10-16 DIAGNOSIS — R42 Dizziness and giddiness: Secondary | ICD-10-CM

## 2019-10-16 DIAGNOSIS — W19XXXA Unspecified fall, initial encounter: Secondary | ICD-10-CM

## 2019-10-16 DIAGNOSIS — I10 Essential (primary) hypertension: Secondary | ICD-10-CM

## 2019-10-16 DIAGNOSIS — S8991XA Unspecified injury of right lower leg, initial encounter: Secondary | ICD-10-CM | POA: Diagnosis not present

## 2019-10-16 DIAGNOSIS — M25561 Pain in right knee: Secondary | ICD-10-CM | POA: Diagnosis not present

## 2019-10-16 DIAGNOSIS — M79604 Pain in right leg: Secondary | ICD-10-CM | POA: Diagnosis not present

## 2019-10-16 DIAGNOSIS — M5459 Other low back pain: Secondary | ICD-10-CM | POA: Insufficient documentation

## 2019-10-16 DIAGNOSIS — M25571 Pain in right ankle and joints of right foot: Secondary | ICD-10-CM | POA: Diagnosis not present

## 2019-10-16 DIAGNOSIS — M79671 Pain in right foot: Secondary | ICD-10-CM | POA: Diagnosis not present

## 2019-10-16 DIAGNOSIS — M169 Osteoarthritis of hip, unspecified: Secondary | ICD-10-CM | POA: Insufficient documentation

## 2019-10-16 NOTE — Progress Notes (Signed)
-      [name] Female    DOB: 1946-06-08   74 y.o.   MRN: [id number] Visit Date: 10/16/2019  Today's Provider: Wilhemena Durie, MD   Chief Complaint  Patient presents with  . Fall  . Dizziness   Subjective:     Fall The accident occurred more than 1 week ago. The fall occurred while standing. She landed on hard floor. The point of impact was the head. The pain is present in the right knee, right hip, right foot, right shoulder and head. The pain is at a severity of 4/10. The pain is mild. The symptoms are aggravated by movement, flexion, standing, rotation and sitting. Associated symptoms include numbness. Pertinent negatives include no abdominal pain, fever, nausea or vomiting. She has tried rest for the symptoms. The treatment provided no relief.  She is able to ambulate.She is sore but improving.  Allergies  Allergen Reactions  . Cholestyramine Nausea And Vomiting  . Ciprofloxacin Nausea And Vomiting  . Codeine Sulfate Nausea And Vomiting  . Keflex [Cephalexin] Nausea And Vomiting  . Macrobid [Nitrofurantoin Macrocrystal] Nausea And Vomiting  . Sulfa Antibiotics Nausea And Vomiting and Rash     Current Outpatient Medications:  .  diazepam (VALIUM) 2 MG tablet, Take 1 tablet (2 mg total) by mouth every 12 (twelve) hours as needed for anxiety., Disp: 60 tablet, Rfl: 0 .  famotidine (PEPCID) 20 MG tablet, Take 1 tablet (20 mg total) by mouth 2 (two) times daily., Disp: 180 tablet, Rfl: 3 .  hydrALAZINE (APRESOLINE) 50 MG tablet, Take 1 tablet (50 mg total) by mouth 3 (three) times daily., Disp: 270 tablet, Rfl: 3 .  meclizine (ANTIVERT) 25 MG tablet, Take 1 tablet (25 mg total) by mouth 2 (two) times daily as needed for dizziness., Disp: 60 tablet, Rfl: 2 .  metoprolol succinate (TOPROL-XL) 25 MG 24 hr tablet, Take 1 tablet (25 mg total) by mouth daily., Disp: 90 tablet, Rfl: 3 .  PARoxetine (PAXIL) 20 MG tablet, TAKE ONE TABLET EVERY DAY, Disp: 90 tablet, Rfl:  3 .  triamterene-hydrochlorothiazide (DYAZIDE) 37.5-25 MG capsule, Take 1 each (1 capsule total) by mouth daily., Disp: 90 capsule, Rfl: 3 .  verapamil (CALAN-SR) 180 MG CR tablet, Take 1 tablet (180 mg total) by mouth 2 (two) times daily., Disp: 180 tablet, Rfl: 3 .  docusate sodium (COLACE) 50 MG capsule, Take 50 mg by mouth daily., Disp: , Rfl:  .  Multiple Vitamin (MULTIVITAMIN) tablet, Take 1 tablet by mouth daily., Disp: , Rfl:   Review of Systems  Constitutional: Negative for appetite change, chills, fatigue and fever.  Respiratory: Negative for chest tightness and shortness of breath.   Cardiovascular: Negative for chest pain and palpitations.  Gastrointestinal: Negative for abdominal pain, nausea and vomiting.  Neurological: Positive for numbness. Negative for dizziness and weakness.    Social History   Tobacco Use  . Smoking status: Never Smoker  . Smokeless tobacco: Never Used  . 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 Alcohol use: Yes    Comment: rare use 1-2 drinks per year.       Objective:   Temp (!) 97.1 F (36.2 C) (Temporal)   Resp 18   Ht _0  (1.702 m)   Wt 144 lb 9.6 oz (65.6 kg)   SpO2 97%   BMI 22.65 kg/m  Vitals:   10/16/19 1451  Resp: 18  Temp: Marland Kitchen)  97.1 F (36.2 C)  TempSrc: Temporal  SpO2: 97%  Weight: 144 lb 9.6 oz (65.6 kg)  Height: _0  (1.702 m)  Body mass index is 22.65 kg/m.   Physical Exam Vitals reviewed.  Constitutional:      Appearance: Normal appearance. She is normal weight.  HENT:     Head: Normocephalic and atraumatic.     Right Ear: External ear normal.     Left Ear: External ear normal.     Mouth/Throat:     Pharynx: Oropharynx is clear.  Eyes:     General: No scleral icterus.    Conjunctiva/sclera: Conjunctivae normal.  Cardiovascular:     Rate and Rhythm: Normal rate and regular rhythm.     Pulses: Normal pulses.     Heart sounds: Normal heart sounds.   Pulmonary:     Effort: Pulmonary effort is normal.     Breath sounds: Normal breath sounds.  Abdominal:     General: Bowel sounds are normal.     Palpations: Abdomen is soft. There is no mass.     Tenderness: There is no abdominal tenderness.  Musculoskeletal:        General: No swelling.     Comments: Mild swelling and bruising of right foot/ankle.  Lymphadenopathy:     Cervical: No cervical adenopathy.  Skin:    General: Skin is warm and dry.  Neurological:     General: No focal deficit present.     Mental Status: She is alert and oriented to person, place, and time. Mental status is at baseline.     Comments: Nonfocal exam.  Psychiatric:        Mood and Affect: Mood normal.        Behavior: Behavior normal.        Thought Content: Thought content normal.        Judgment: Judgment normal.   ECG NSR.   No results found for any visits on 10/16/19.     Assessment & Plan    1. Dizziness ECG WNL. Push fluids. Pt appears to have had syncopal/orthostatic episode leading to fall. - EKG 12-Lead - DG Ankle Complete Right - US Carotid Duplex Bilateral - CBC with Diff - Comp Met (CMET) - TSH - Hemoglobin A1c - Ambulatory referral to Cardiology  2. Fall, initial encounter  - DG Knee Complete 4 Views Left - DG Foot Complete Right - DG Ankle Complete Right - US Carotid Duplex Bilateral - CBC with Diff - Comp Met (CMET) - TSH - Hemoglobin A1c  3. Essential hypertension  - CBC with Diff - Comp Met (CMET) - TSH - Hemoglobin A1c - Ambulatory referral to Cardiology  4. Blood glucose elevated  - CBC with Diff - Comp Met (CMET) - TSH - Hemoglobin A1c  5. Pain of right lower extremity  - CBC with Diff - Comp Met (CMET) - TSH - Hemoglobin A1c    Wilhemena Durie, MD  [facility]

## 2019-10-16 NOTE — Patient Instructions (Addendum)
1. Dizziness  - EKG 12-Lead - DG Ankle Complete Right - US Carotid Duplex Bilateral - CBC with Diff - Comp Met (CMET) - TSH - Hemoglobin A1c - Ambulatory referral to Cardiology  2. Fall, initial encounter  - DG Knee Complete 4 Views Left - DG Foot Complete Right - DG Ankle Complete Right - US Carotid Duplex Bilateral - CBC with Diff - Comp Met (CMET) - TSH - Hemoglobin A1c  3. Essential hypertension  - CBC with Diff - Comp Met (CMET) - TSH - Hemoglobin A1c - Ambulatory referral to Cardiology  4. Blood glucose elevated  - CBC with Diff - Comp Met (CMET) - TSH - Hemoglobin A1c  5. Pain of right lower extremity  - CBC with Diff - Comp Met (CMET) - TSH - Hemoglobin A1c

## 2019-10-17 ENCOUNTER — Other Ambulatory Visit: Payer: Self-pay | Admitting: *Deleted

## 2019-10-17 DIAGNOSIS — S82891A Other fracture of right lower leg, initial encounter for closed fracture: Secondary | ICD-10-CM

## 2019-10-17 LAB — CBC WITH DIFFERENTIAL/PLATELET
Basophils Absolute: 0 10*3/uL (ref 0.0–0.2)
Basos: 0 %
EOS (ABSOLUTE): 0.4 10*3/uL (ref 0.0–0.4)
Eos: 5 %
Hematocrit: 33.9 % — ABNORMAL LOW (ref 34.0–46.6)
Hemoglobin: 11.7 g/dL (ref 11.1–15.9)
Immature Grans (Abs): 0 10*3/uL (ref 0.0–0.1)
Immature Granulocytes: 0 %
Lymphocytes Absolute: 2.4 10*3/uL (ref 0.7–3.1)
Lymphs: 29 %
MCH: 32.6 pg (ref 26.6–33.0)
MCHC: 34.5 g/dL (ref 31.5–35.7)
MCV: 94 fL (ref 79–97)
Monocytes Absolute: 0.9 10*3/uL (ref 0.1–0.9)
Monocytes: 11 %
Neutrophils Absolute: 4.4 10*3/uL (ref 1.4–7.0)
Neutrophils: 55 %
Platelets: 269 10*3/uL (ref 150–450)
RBC: 3.59 x10E6/uL — ABNORMAL LOW (ref 3.77–5.28)
RDW: 11.7 % (ref 11.7–15.4)
WBC: 8.2 10*3/uL (ref 3.4–10.8)

## 2019-10-17 LAB — COMPREHENSIVE METABOLIC PANEL
ALT: 15 IU/L (ref 0–32)
AST: 18 IU/L (ref 0–40)
Albumin/Globulin Ratio: 1.7 (ref 1.2–2.2)
Albumin: 4.3 g/dL (ref 3.7–4.7)
Alkaline Phosphatase: 94 IU/L (ref 39–117)
BUN/Creatinine Ratio: 24 (ref 12–28)
BUN: 24 mg/dL (ref 8–27)
Bilirubin Total: <0.2 mg/dL (ref 0.0–1.2)
CO2: 22 mmol/L (ref 20–29)
Calcium: 9.7 mg/dL (ref 8.7–10.3)
Chloride: 106 mmol/L (ref 96–106)
Creatinine, Ser: 1.01 mg/dL — ABNORMAL HIGH (ref 0.57–1.00)
GFR calc Af Amer: 63 mL/min/{1.73_m2} (ref >59)
GFR calc non Af Amer: 55 mL/min/{1.73_m2} — ABNORMAL LOW (ref >59)
Globulin, Total: 2.5 g/dL (ref 1.5–4.5)
Glucose: 89 mg/dL (ref 65–99)
Potassium: 5.2 mmol/L (ref 3.5–5.2)
Sodium: 140 mmol/L (ref 134–144)
Total Protein: 6.8 g/dL (ref 6.0–8.5)

## 2019-10-17 LAB — HGB A1C W/O EAG: Hgb A1c MFr Bld: 5.6 % (ref 4.8–5.6)

## 2019-10-17 LAB — TSH: TSH: 1.79 u[IU]/mL (ref 0.450–4.500)

## 2019-10-21 DIAGNOSIS — R0602 Shortness of breath: Secondary | ICD-10-CM | POA: Diagnosis not present

## 2019-10-21 DIAGNOSIS — I5032 Chronic diastolic (congestive) heart failure: Secondary | ICD-10-CM | POA: Diagnosis not present

## 2019-10-21 DIAGNOSIS — R079 Chest pain, unspecified: Secondary | ICD-10-CM | POA: Diagnosis not present

## 2019-10-21 DIAGNOSIS — R55 Syncope and collapse: Secondary | ICD-10-CM | POA: Insufficient documentation

## 2019-10-21 DIAGNOSIS — E7800 Pure hypercholesterolemia, unspecified: Secondary | ICD-10-CM | POA: Diagnosis not present

## 2019-10-21 DIAGNOSIS — I10 Essential (primary) hypertension: Secondary | ICD-10-CM | POA: Diagnosis not present

## 2019-10-21 DIAGNOSIS — Z23 Encounter for immunization: Secondary | ICD-10-CM | POA: Diagnosis not present

## 2019-10-22 ENCOUNTER — Encounter: Payer: Self-pay | Admitting: Podiatry

## 2019-10-22 ENCOUNTER — Other Ambulatory Visit: Payer: Self-pay

## 2019-10-22 ENCOUNTER — Ambulatory Visit: Payer: PPO | Admitting: Podiatry

## 2019-10-22 VITALS — BP 105/63 | HR 66 | Resp 16

## 2019-10-22 DIAGNOSIS — S82891D Other fracture of right lower leg, subsequent encounter for closed fracture with routine healing: Secondary | ICD-10-CM

## 2019-10-22 NOTE — Progress Notes (Signed)
Subjective:  Patient ID: Jessica Armstrong, female    DOB: September 20, 1946,  MRN: [id number] HPI Chief Complaint  Patient presents with  . Foot Injury    Lateral ankle and foot right - DOI 12/31 - patient fell due to BP dropping too low, PCP xrayed and noticed a fracture so they referred here  . New Patient (Initial Visit)    74 y.o. female presents with the above complaint.   ROS: Denies fever chills nausea vomiting muscle aches pains calf pain back pain chest pain shortness of breath.  States that she has had hypotensive episodes and has been passing out and falling.  States that she feels very unstable on her right extremity.  Past Medical History:  Diagnosis Date  . Anemia    mild; no medical treatment  . Arthritis   . Carotid artery stenosis    a. doppler [id number]: RICA 123456, LICA 123456 f/u 1 year  . Cervical spondyloarthritis   . Chronic diastolic CHF (congestive heart failure) ([place])    a. echo 04/2015: EF 55-60%, no RWMA, mitral valve w/ systolic bowing w/o prolapse, PASP 53 mmHg  . Claudication ([place])   . Colon polyp   . Cystitis   . Depression    situational  . Dyspnea   . GERD (gastroesophageal reflux disease)   . H/O vertigo   . History of measles   . History of mumps   . Hypercholesterolemia   . Hyperglycemia   . Hypertension   . Need for SBE (subacute bacterial endocarditis) prophylaxis   . Neuropathy   . Palpitations   . PONV (postoperative nausea and vomiting)    severe n/v after anesthesia  . Post-menopausal   . Pulmonary valve dysplasia   . Rotator cuff tear, right   . Seasonal allergies   . TMJ (dislocation of temporomandibular joint)   . Tricuspid regurgitation    a. echo 04/2013: EF 55-60%, nl wall motion, mild to mod TR, PASP 35 mm Hg   Past Surgical History:  Procedure Laterality Date  . ABDOMINAL HYSTERECTOMY    . ANTERIOR CERVICAL DECOMP/DISCECTOMY FUSION N/A 08/26/2013   Procedure: CERVICAL SIX TO SEVEN ANTERIOR CERVICAL DECOMPRESSION/DISCECTOMY  FUSION 1 LEVEL;  Surgeon: Erline Levine, MD;  Location: [place] NEURO ORS;  Service: Neurosurgery;  Laterality: N/A;  C6-7 Anterior cervical decompression/diskectomy/fusion  . BREAST BIOPSY Right    neg  . CHOLECYSTECTOMY    . COLONOSCOPY WITH PROPOFOL N/A 01/14/2016   Procedure: COLONOSCOPY WITH PROPOFOL;  Surgeon: Manya Silvas, MD;  Location: [facility] ENDOSCOPY;  Service: Endoscopy;  Laterality: N/A;  . COLONOSCOPY WITH PROPOFOL N/A 11/04/2018   Procedure: COLONOSCOPY WITH PROPOFOL;  Surgeon: Manya Silvas, MD;  Location: [facility] ENDOSCOPY;  Service: Endoscopy;  Laterality: N/A;  . COLONOSCOPY WITH PROPOFOL N/A 07/09/2019   Procedure: COLONOSCOPY WITH PROPOFOL;  Surgeon: Toledo, Benay Pike, MD;  Location: [facility] ENDOSCOPY;  Service: Gastroenterology;  Laterality: N/A;  . ESOPHAGOGASTRODUODENOSCOPY (EGD) WITH PROPOFOL N/A 01/14/2016   Procedure: ESOPHAGOGASTRODUODENOSCOPY (EGD) WITH PROPOFOL;  Surgeon: Manya Silvas, MD;  Location[facility] ENDOSCOPY;  Service: Endoscopy;  Laterality: N/A;  . ESOPHAGOGASTRODUODENOSCOPY (EGD) WITH PROPOFOL N/A 11/04/2018   Procedure: ESOPHAGOGASTRODUODENOSCOPY (EGD) WITH PROPOFOL;  Surgeon: Manya Silvas, MD;  Location: [facility] ENDOSCOPY;  Service: Endoscopy;  Laterality: N/A;  . EYE SURGERY     benign tumor of eye  . SHOULDER ARTHROSCOPY WITH ROTATOR CUFF REPAIR AND SUBACROMIAL DECOMPRESSION Right 05/31/2017   Procedure: SHOULDER ARTHROSCOPY WITH SUBACROMIAL DECOMPRESSION AND DISTAL CLAVICAL EXCISION;  Surgeon: Tamera Punt,  Larkin Ina, MD;  Location: [place];  Service: Orthopedics;  Laterality: Right;  SHOULDER ARTHROSCOPY WITH SUBACROMIAL DECOMPRESSION AND DISTAL CLAVICAL EXCISION    Current Outpatient Medications:  .  diazepam (VALIUM) 2 MG tablet, Take 1 tablet (2 mg total) by mouth every 12 (twelve) hours as needed for anxiety., Disp: 60 tablet, Rfl: 0 .  docusate sodium (COLACE) 50 MG capsule, Take 50 mg by mouth daily., Disp: , Rfl:  .  famotidine (PEPCID) 20 MG tablet, Take 1  tablet (20 mg total) by mouth 2 (two) times daily., Disp: 180 tablet, Rfl: 3 .  hydrALAZINE (APRESOLINE) 50 MG tablet, Take 1 tablet (50 mg total) by mouth 3 (three) times daily., Disp: 270 tablet, Rfl: 3 .  meclizine (ANTIVERT) 25 MG tablet, Take 1 tablet (25 mg total) by mouth 2 (two) times daily as needed for dizziness., Disp: 60 tablet, Rfl: 2 .  metoprolol succinate (TOPROL-XL) 25 MG 24 hr tablet, Take 1 tablet (25 mg total) by mouth daily., Disp: 90 tablet, Rfl: 3 .  Multiple Vitamin (MULTIVITAMIN) tablet, Take 1 tablet by mouth daily., Disp: , Rfl:  .  PARoxetine (PAXIL) 20 MG tablet, TAKE ONE TABLET EVERY DAY, Disp: 90 tablet, Rfl: 3 .  triamterene-hydrochlorothiazide (DYAZIDE) 37.5-25 MG capsule, Take 1 each (1 capsule total) by mouth daily., Disp: 90 capsule, Rfl: 3 .  verapamil (CALAN-SR) 180 MG CR tablet, Take 1 tablet (180 mg total) by mouth 2 (two) times daily., Disp: 180 tablet, Rfl: 3  Allergies  Allergen Reactions  . Cholestyramine Nausea And Vomiting  . Ciprofloxacin Nausea And Vomiting  . Codeine Sulfate Nausea And Vomiting  . Keflex [Cephalexin] Nausea And Vomiting  . Macrobid [Nitrofurantoin Macrocrystal] Nausea And Vomiting  . Sulfa Antibiotics Nausea And Vomiting and Rash   Review of Systems Objective:   Vitals:   10/22/19 0938  BP: 105/63  Pulse: 66  Resp: 16    General: Well developed, nourished, in no acute distress, alert and oriented x3   Dermatological: Skin is warm, dry and supple bilateral. Nails x 10 are well maintained; remaining integument appears unremarkable at this time. There are no open sores, no preulcerative lesions, no rash or signs of infection present.  Vascular: Dorsalis Pedis artery and Posterior Tibial artery pedal pulses are 2/4 bilateral with immedate capillary fill time. Pedal hair growth present. No varicosities and no lower extremity edema present bilateral.   Neruologic: Grossly intact via light touch bilateral. Vibratory intact  via tuning fork bilateral. Protective threshold with Semmes Wienstein monofilament intact to all pedal sites bilateral. Patellar and Achilles deep tendon reflexes 2+ bilateral. No Babinski or clonus noted bilateral.   Musculoskeletal: No gross boney pedal deformities bilateral. No pain, crepitus, or limitation noted with foot and ankle range of motion bilateral. Muscular strength 5/5 in all groups tested bilateral.  Pain on palpation to the lateral ankle and the peroneal tendons right.  Anteriorly talofibular ligament is painful.  Gait: Unassisted, Nonantalgic.    Radiographs:  Radiographs reviewed today from those taken October 15, 2018 demonstrate no significant findings swelling around the ankle no fractures other than what appears to be a small stress fracture of the dorsal navicular bone.  Assessment & Plan:   Assessment: Sprained ankle right.  Plan: Placed in a Tri-Lock brace and I would like to follow-up with her in about 3 weeks     Camran Keady T. [place], [place]

## 2019-10-24 ENCOUNTER — Other Ambulatory Visit: Payer: Self-pay

## 2019-10-24 ENCOUNTER — Ambulatory Visit
Admission: RE | Admit: 2019-10-24 | Discharge: 2019-10-24 | Disposition: A | Discharge status: Home / Self Care | Payer: PPO | Source: Ambulatory Visit | Attending: Family Medicine | Admitting: Family Medicine

## 2019-10-24 DIAGNOSIS — I6523 Occlusion and stenosis of bilateral carotid arteries: Secondary | ICD-10-CM | POA: Insufficient documentation

## 2019-10-24 DIAGNOSIS — R55 Syncope and collapse: Secondary | ICD-10-CM | POA: Diagnosis not present

## 2019-10-24 DIAGNOSIS — R42 Dizziness and giddiness: Secondary | ICD-10-CM | POA: Diagnosis not present

## 2019-10-24 DIAGNOSIS — W19XXXA Unspecified fall, initial encounter: Secondary | ICD-10-CM | POA: Diagnosis not present

## 2019-10-27 ENCOUNTER — Telehealth: Payer: Self-pay | Admitting: *Deleted

## 2019-10-27 NOTE — Telephone Encounter (Signed)
Patient was notified of results. Expressed understanding. Patient states she still feels weak. Also patient states she is still having numbness in her right foot. Patient states that she just feels "werid", not right. Patient wants to know what else Dr. Rosanna Randy recommends for her? Advised pt Dr. Rosanna Randy is out of the office and we can send message to another provider. However, patient states for now she will wait for her cardiologist to get back to her with results for Holter Monitor. Also she will wait for Dr. Rosanna Randy to return.

## 2019-10-29 ENCOUNTER — Telehealth: Payer: Self-pay

## 2019-10-29 ENCOUNTER — Other Ambulatory Visit: Payer: Self-pay

## 2019-10-29 MED ORDER — CIMETIDINE 400 MG PO TABS: 400.0000 mg | ORAL_TABLET | Freq: Two times a day (BID) | ORAL | 3 refills | Status: DC

## 2019-10-29 NOTE — Telephone Encounter (Signed)
-----   Message from Lucilla Lame, MD sent at 10/29/2019  4:16 PM EST ----- Regarding: RE: That would be fine. ----- Message ----- From: Glennie [place], CMA Sent: 10/29/2019   3:51 PM EST To: Lucilla Lame, MD  Pt was prescribed Pecid 20mg  BID. Its just not working. Pt has research medications that do not effect the chromogranin A level. The other medication is Tagamet. She has tried her husbands and it seems to help. He is currently taking 400mg . She is wanting to know if she can have it written for BID incase she needs a boost in the afternoon. Is this okay?

## 2019-11-03 DIAGNOSIS — R55 Syncope and collapse: Secondary | ICD-10-CM | POA: Diagnosis not present

## 2019-11-04 DIAGNOSIS — I10 Essential (primary) hypertension: Secondary | ICD-10-CM | POA: Diagnosis not present

## 2019-11-04 DIAGNOSIS — E7800 Pure hypercholesterolemia, unspecified: Secondary | ICD-10-CM | POA: Diagnosis not present

## 2019-11-04 DIAGNOSIS — F411 Generalized anxiety disorder: Secondary | ICD-10-CM | POA: Diagnosis not present

## 2019-11-04 DIAGNOSIS — R5383 Other fatigue: Secondary | ICD-10-CM | POA: Diagnosis not present

## 2019-11-04 DIAGNOSIS — R0602 Shortness of breath: Secondary | ICD-10-CM | POA: Diagnosis not present

## 2019-11-04 DIAGNOSIS — R5381 Other malaise: Secondary | ICD-10-CM | POA: Diagnosis not present

## 2019-11-04 DIAGNOSIS — R55 Syncope and collapse: Secondary | ICD-10-CM | POA: Diagnosis not present

## 2019-11-04 DIAGNOSIS — I5032 Chronic diastolic (congestive) heart failure: Secondary | ICD-10-CM | POA: Diagnosis not present

## 2019-11-11 NOTE — Progress Notes (Signed)
[name], Female    DOB: 01-Feb-1946, 74 y.o.   MRN: [id number] Visit Date: 11/13/2019  Today's Provider: Wilhemena Durie, MD   Chief Complaint  Patient presents with  . Medicare Wellness   Subjective:     Annual wellness visit/Annual physical Jessica Armstrong is a 74 y.o. female. She feels well. She reports exercising 4 times weekly. She reports she is sleeping poorly. She saw Dr. Saralyn Pilar  3 weeks ago for follow-up and vasovagal syncope.  She has been off verapamil for 2 weeks.  She states she had another similar episode 1 week ago and fell again.  She felt lightheaded but did not pass out or lose consciousness.  She did not injure herself in that fall. She is having some breakthrough heartburn and reflux, also. ----------------------------------------------------------- Colonoscopy: 07/09/2019 Mammogram: 08/01/2019 Bi-Rads Cat I BMD: 01/24/2018   Review of Systems  Constitutional: Positive for fatigue. Negative for chills and fever.  HENT: Negative for congestion, ear pain, rhinorrhea, sneezing and sore throat.   Eyes: Negative.  Negative for pain and redness.  Respiratory: Negative for cough, shortness of breath and wheezing.   Cardiovascular: Negative for chest pain and leg swelling.  Gastrointestinal: Negative for abdominal pain, blood in stool, constipation, diarrhea and nausea.       Heartburn.  Endocrine: Negative for polydipsia and polyphagia.  Genitourinary: Negative.  Negative for dysuria, flank pain, hematuria, pelvic pain, vaginal bleeding and vaginal discharge.  Musculoskeletal: Negative for arthralgias, back pain, gait problem and joint swelling.  Skin: Negative for rash.  Neurological: Negative.  Negative for dizziness, tremors, seizures, weakness, light-headedness, numbness and headaches.  Hematological: Negative for adenopathy.  Psychiatric/Behavioral: Negative.  Negative for behavioral problems, confusion and dysphoric mood. The patient is  not nervous/anxious and is not hyperactive.     Social History   Socioeconomic History  . Marital status: Married    Spouse name: Not on file  . Number of children: Not on file  . Years of education: Not on file  . Highest education level: Not on file  Occupational History  . Not on file  Tobacco Use  . Smoking status: Never Smoker  . Smokeless tobacco: Never Used  . 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 Alcohol use: Yes    Comment: rare use 1-2 drinks per year.   . Drug use: No  . Sexual activity: Not on file  Other Topics Concern  . Not on file  Social History Narrative  . Not on file   Social Determinants of Health   Financial Resource Strain:   . Difficulty of Paying Living Expenses: Not on file  Food Insecurity:   . Worried About Charity fundraiser in the Last Year: Not on file  . Ran Out of Food in the Last Year: Not on file  Transportation Needs:   . Lack of Transportation (Medical): Not on file  . Lack of Transportation (Non-Medical): Not on file  Physical Activity:   . Days of Exercise per Week: Not on file  . Minutes of Exercise per Session: Not on file  Stress:   . Feeling of Stress : Not on file  Social Connections:   . Frequency of Communication with Friends and Family: Not on file  . Frequency of Social Gatherings with Friends and Family: Not on file  . Attends Religious Services: Not on file  . Active Member of  Clubs or Organizations: Not on file  . Attends Archivist Meetings: Not on file  . Marital Status: Not on file  Intimate Partner Violence:   . Fear of Current or Ex-Partner: Not on file  . Emotionally Abused: Not on file  . Physically Abused: Not on file  . Sexually Abused: Not on file    Past Medical History:  Diagnosis Date  . Anemia    mild; no medical treatment  . Arthritis   . Carotid artery stenosis    a. doppler [id number]: RICA 123456, LICA 123456 f/u 1  year  . Cervical spondyloarthritis   . Chronic diastolic CHF (congestive heart failure) ([place])    a. echo 04/2015: EF 55-60%, no RWMA, mitral valve w/ systolic bowing w/o prolapse, PASP 53 mmHg  . Claudication ([place])   . Colon polyp   . Cystitis   . Depression    situational  . Dyspnea   . GERD (gastroesophageal reflux disease)   . H/O vertigo   . History of measles   . History of mumps   . Hypercholesterolemia   . Hyperglycemia   . Hypertension   . Need for SBE (subacute bacterial endocarditis) prophylaxis   . Neuropathy   . Palpitations   . PONV (postoperative nausea and vomiting)    severe n/v after anesthesia  . Post-menopausal   . Pulmonary valve dysplasia   . Rotator cuff tear, right   . Seasonal allergies   . TMJ (dislocation of temporomandibular joint)   . Tricuspid regurgitation    a. echo 04/2013: EF 55-60%, nl wall motion, mild to mod TR, PASP 35 mm Hg     Patient Active Problem List   Diagnosis Date Noted  . Vasovagal syncope 10/21/2019  . Lumbar facet joint pain 10/16/2019  . Osteoarthritis of hip 10/16/2019  . Chronic diarrhea of unknown origin 07/15/2019  . Elevated gastrin level 07/15/2019  . Diarrhea 02/20/2019  . Diverticulosis 02/20/2019  . Schatzki's ring of distal esophagus 02/20/2019  . Dizziness 06/30/2016  . Carotid artery stenosis   . Chronic diastolic CHF (congestive heart failure) ([place])   . Fatigue 04/19/2015  . Anxiety 04/19/2015  . Allergic rhinitis 04/19/2015  . Arthritis 04/19/2015  . Disorder of carbohydrate transport and metabolism ([place]) 04/19/2015  . Diverticulitis 04/19/2015  . Acid reflux 04/19/2015  . Hormone replacement therapy (postmenopausal) 04/19/2015  . Hypercholesteremia 04/19/2015  . Blood glucose elevated 04/19/2015  . Neuropathy 04/19/2015  . Pericarditis 04/19/2015  . Common wart 04/19/2015  . Gastroesophageal reflux disease with esophagitis 04/19/2015  . Hot flashes 06/10/2013  . Hemoptysis 04/21/2013  . Chest  pain 04/08/2013  . SOB (shortness of breath) 04/08/2013  . Murmur 04/08/2013  . Essential hypertension 04/08/2013    Past Surgical History:  Procedure Laterality Date  . ABDOMINAL HYSTERECTOMY    . ANTERIOR CERVICAL DECOMP/DISCECTOMY FUSION N/A 08/26/2013   Procedure: CERVICAL SIX TO SEVEN ANTERIOR CERVICAL DECOMPRESSION/DISCECTOMY FUSION 1 LEVEL;  Surgeon: Erline Levine, MD;  Location: [place] NEURO ORS;  Service: Neurosurgery;  Laterality: N/A;  C6-7 Anterior cervical decompression/diskectomy/fusion  . BREAST BIOPSY Right    neg  . CHOLECYSTECTOMY    . COLONOSCOPY WITH PROPOFOL N/A 01/14/2016   Procedure: COLONOSCOPY WITH PROPOFOL;  Surgeon: Manya Silvas, MD;  Location: [facility] ENDOSCOPY;  Service: Endoscopy;  Laterality: N/A;  . COLONOSCOPY WITH PROPOFOL N/A 11/04/2018   Procedure: COLONOSCOPY WITH PROPOFOL;  Surgeon: Manya Silvas, MD;  Location: [facility] ENDOSCOPY;  Service: Endoscopy;  Laterality: N/A;  . COLONOSCOPY  WITH PROPOFOL N/A 07/09/2019   Procedure: COLONOSCOPY WITH PROPOFOL;  Surgeon: Toledo, Benay Pike, MD;  Location: [facility] ENDOSCOPY;  Service: Gastroenterology;  Laterality: N/A;  . ESOPHAGOGASTRODUODENOSCOPY (EGD) WITH PROPOFOL N/A 01/14/2016   Procedure: ESOPHAGOGASTRODUODENOSCOPY (EGD) WITH PROPOFOL;  Surgeon: Manya Silvas, MD;  Location: [facility] ENDOSCOPY;  Service: Endoscopy;  Laterality: N/A;  . ESOPHAGOGASTRODUODENOSCOPY (EGD) WITH PROPOFOL N/A 11/04/2018   Procedure: ESOPHAGOGASTRODUODENOSCOPY (EGD) WITH PROPOFOL;  Surgeon: Manya Silvas, MD;  Location: [facility] ENDOSCOPY;  Service: Endoscopy;  Laterality: N/A;  . EYE SURGERY     benign tumor of eye  . SHOULDER ARTHROSCOPY WITH ROTATOR CUFF REPAIR AND SUBACROMIAL DECOMPRESSION Right 05/31/2017   Procedure: SHOULDER ARTHROSCOPY WITH SUBACROMIAL DECOMPRESSION AND DISTAL CLAVICAL EXCISION;  Surgeon: Tania Ade, MD;  Location: [place];  Service: Orthopedics;  Laterality: Right;  SHOULDER ARTHROSCOPY WITH SUBACROMIAL  DECOMPRESSION AND DISTAL CLAVICAL EXCISION    Her family history includes Asthma in her maternal grandfather; COPD in her mother; Colon cancer in her father; Heart disease in her mother; Hypertension in her brother and mother. There is no history of Breast cancer.   Current Outpatient Medications:  .  diazepam (VALIUM) 2 MG tablet, Take 1 tablet (2 mg total) by mouth every 12 (twelve) hours as needed for anxiety., Disp: 60 tablet, Rfl: 0 .  famotidine (PEPCID) 20 MG tablet, Take 1 tablet (20 mg total) by mouth 2 (two) times daily., Disp: 180 tablet, Rfl: 3 .  hydrALAZINE (APRESOLINE) 50 MG tablet, Take 1 tablet (50 mg total) by mouth 3 (three) times daily., Disp: 270 tablet, Rfl: 3 .  meclizine (ANTIVERT) 25 MG tablet, Take 1 tablet (25 mg total) by mouth 2 (two) times daily as needed for dizziness., Disp: 60 tablet, Rfl: 2 .  metoprolol succinate (TOPROL-XL) 25 MG 24 hr tablet, Take 1 tablet (25 mg total) by mouth daily., Disp: 90 tablet, Rfl: 3 .  Multiple Vitamin (MULTIVITAMIN) tablet, Take 1 tablet by mouth daily., Disp: , Rfl:  .  PARoxetine (PAXIL) 20 MG tablet, TAKE ONE TABLET EVERY DAY, Disp: 90 tablet, Rfl: 3 .  triamterene-hydrochlorothiazide (DYAZIDE) 37.5-25 MG capsule, Take 1 each (1 capsule total) by mouth daily., Disp: 90 capsule, Rfl: 3 .  cimetidine (TAGAMET) 400 MG tablet, Take 1 tablet (400 mg total) by mouth 2 (two) times daily. (Patient not taking: Reported on 11/13/2019), Disp: 60 tablet, Rfl: 3  Patient Care Team: Jerrol Banana., MD as PCP - General (Family Medicine)    Objective:    Vitals: BP (!) 116/56 (BP Location: Left Arm, Patient Position: Sitting, Cuff Size: Large)   Pulse 60   Temp (!) 96.8 F (36 C) (Temporal)   Resp 16   Ht 5\' 7"  (1.702 m)   Wt 142 lb (64.4 kg)   SpO2 97% Comment: room air  BMI 22.24 kg/m   Physical Exam Vitals reviewed.  Constitutional:      Appearance: Normal appearance. She is normal weight.  HENT:     Head:  Normocephalic and atraumatic.     Right Ear: External ear normal.     Left Ear: External ear normal.     Mouth/Throat:     Pharynx: Oropharynx is clear.  Eyes:     General: No scleral icterus.    Conjunctiva/sclera: Conjunctivae normal.  Cardiovascular:     Rate and Rhythm: Normal rate and regular rhythm.     Pulses: Normal pulses.     Heart sounds: Normal heart sounds.  Pulmonary:  Effort: Pulmonary effort is normal.     Breath sounds: Normal breath sounds.  Chest:     Breasts:        Right: Normal.        Left: Normal.  Abdominal:     General: Bowel sounds are normal.     Palpations: Abdomen is soft. There is no mass.     Tenderness: There is no abdominal tenderness.  Musculoskeletal:        General: No swelling.  Lymphadenopathy:     Cervical: No cervical adenopathy.  Skin:    General: Skin is warm and dry.  Neurological:     General: No focal deficit present.     Mental Status: She is alert and oriented to person, place, and time. Mental status is at baseline.  Psychiatric:        Mood and Affect: Mood normal.        Behavior: Behavior normal.        Thought Content: Thought content normal.        Judgment: Judgment normal.     Activities of Daily Living In your present state of health, do you have any difficulty performing the following activities: 11/13/2019 10/16/2019  Hearing? N N  Vision? N N  Difficulty concentrating or making decisions? N N  Walking or climbing stairs? N N  Dressing or bathing? N N  Doing errands, shopping? N N  Some recent data might be hidden    Fall Risk Assessment Fall Risk  10/16/2019 05/08/2019 12/20/2017 12/19/2016 12/07/2015  Falls in the past year? 1 (No Data) No No No  Comment - Emmi Telephone Survey: data to providers prior to load - - -  Number falls in past yr: 1 (No Data) - - -  Comment - Emmi Telephone Survey Actual Response =  - - -  Injury with Fall? 1 - - - -  Risk for fall due to : History of fall(s) - - - -       Depression Screen PHQ 2/9 Scores 11/13/2019 12/20/2017 12/19/2016 12/07/2015  PHQ - 2 Score 0 0 0 0  PHQ- 9 Score 2 0 2 -    6CIT Screen 12/19/2016  What Year? 0 points  What month? 0 points  What time? 0 points  Count back from 20 0 points  Months in reverse 0 points  Repeat phrase 4 points  Total Score 4      Assessment & Plan:     Annual Wellness Visit Annual Physical Reviewed patient's Family Medical History Reviewed and updated list of patient's medical providers Assessment of cognitive impairment was done Assessed patient's functional ability Established a written schedule for health screening [place] Completed and Reviewed  Exercise Activities and Dietary recommendations Goals   None     Immunization History  Administered Date(s) Administered  . Influenza Whole 07/09/2012  . Influenza, High Dose Seasonal PF 07/11/2016, 08/06/2017, 07/17/2018, 07/28/2019  . Influenza-Unspecified 09/10/2015, 07/17/2018  . Pneumococcal Conjugate-13 12/07/2015  . Pneumococcal Polysaccharide-23 11/23/2011  . Zoster 08/06/2013    Health Maintenance  Topic Date Due  . Hepatitis C Screening  1945/11/27  . TETANUS/TDAP  06/30/1963  . COLONOSCOPY  07/08/2024  . INFLUENZA VACCINE  Completed  . DEXA SCAN  Completed  . PNA vac Low Risk Adult  Completed     Discussed health benefits of physical activity, and encouraged her to engage in regular exercise appropriate for her age and condition.    ------------------------------------------------------------------------------------------------------------ 1. Encounter  for annual physical examination excluding gynecological examination in a patient older than 17 years   2. Encounter for subsequent annual wellness visit (AWV) in Medicare patient   3. Hypercholesteremia  - rosuvastatin (CRESTOR) 10 MG tablet; Take 1 tablet (10 mg total) by mouth daily.  Dispense: 90 tablet; Refill: 1  4. Mixed  hyperlipidemia  - rosuvastatin (CRESTOR) 10 MG tablet; Take 1 tablet (10 mg total) by mouth daily.  Dispense: 90 tablet; Refill: 1  5. Vasovagal syncope Push fluids.  No Neurologic work-up at this time.  May do so in the future.  6. Stenosis of carotid artery, unspecified laterality   7. Anxiety   8. Essential hypertension Patient now off of verapamil.  Push fluids and will follow up with blood pressure on her next visit in 1 to 2 months 9.GERD Add Gaviscon as needed for reflux.  She has had thorough GI work-up in the last 6 months.     I,Roshena L Chambers,acting as a scribe for Wilhemena Durie, MD.,have documented all relevant documentation on the behalf of Wilhemena Durie, MD,as directed by  Wilhemena Durie, MD while in the presence of Wilhemena Durie, MD.  Wilhemena Durie, MD  [facility]

## 2019-11-12 ENCOUNTER — Ambulatory Visit: Payer: PPO | Admitting: Podiatry

## 2019-11-13 ENCOUNTER — Encounter: Payer: Self-pay | Admitting: Family Medicine

## 2019-11-13 ENCOUNTER — Ambulatory Visit (INDEPENDENT_AMBULATORY_CARE_PROVIDER_SITE_OTHER): Payer: PPO | Admitting: Family Medicine

## 2019-11-13 ENCOUNTER — Other Ambulatory Visit: Payer: Self-pay

## 2019-11-13 VITALS — BP 116/56 | HR 60 | Temp 96.8°F | Resp 16 | Ht 67.0 in | Wt 142.0 lb

## 2019-11-13 DIAGNOSIS — Z0000 Encounter for general adult medical examination without abnormal findings: Secondary | ICD-10-CM

## 2019-11-13 DIAGNOSIS — F419 Anxiety disorder, unspecified: Secondary | ICD-10-CM | POA: Diagnosis not present

## 2019-11-13 DIAGNOSIS — I10 Essential (primary) hypertension: Secondary | ICD-10-CM

## 2019-11-13 DIAGNOSIS — E782 Mixed hyperlipidemia: Secondary | ICD-10-CM | POA: Diagnosis not present

## 2019-11-13 DIAGNOSIS — E7800 Pure hypercholesterolemia, unspecified: Secondary | ICD-10-CM

## 2019-11-13 DIAGNOSIS — I6529 Occlusion and stenosis of unspecified carotid artery: Secondary | ICD-10-CM

## 2019-11-13 DIAGNOSIS — R55 Syncope and collapse: Secondary | ICD-10-CM

## 2019-11-13 MED ORDER — ROSUVASTATIN CALCIUM 10 MG PO TABS: 10.0000 mg | ORAL_TABLET | Freq: Every day | ORAL | 1 refills | Status: DC

## 2019-11-13 NOTE — Patient Instructions (Signed)
Recommend OTC Gaviscon for reflux

## 2019-11-20 ENCOUNTER — Encounter: Payer: Self-pay | Admitting: Family Medicine

## 2019-11-21 ENCOUNTER — Other Ambulatory Visit: Payer: Self-pay | Admitting: *Deleted

## 2019-11-21 ENCOUNTER — Ambulatory Visit: Payer: PPO | Attending: Internal Medicine

## 2019-11-21 DIAGNOSIS — Z23 Encounter for immunization: Secondary | ICD-10-CM | POA: Insufficient documentation

## 2019-11-21 NOTE — Progress Notes (Signed)
   Covid-19 Vaccination Clinic  Name:  KATI SHAFER    MRN: [id number] DOB: 06/13/1946  11/21/2019  Ms. Eisel was observed post Covid-19 immunization for 15 minutes without incidence. She was provided with Vaccine Information Sheet and instruction to access the V-Safe system.   Ms. Hickox was instructed to call 911 with any severe reactions post vaccine: Marland Kitchen Difficulty breathing  . Swelling of your face and throat  . A fast heartbeat  . A bad rash all over your body  . Dizziness and weakness    Immunizations Administered    Name Date Dose VIS Date Route   Pfizer COVID-19 Vaccine 11/21/2019 10:53 AM 0.3 mL 09/19/2019 Intramuscular   Manufacturer: [place]   Lot: [id number]   [place]: [id number]

## 2019-11-25 DIAGNOSIS — I10 Essential (primary) hypertension: Secondary | ICD-10-CM | POA: Diagnosis not present

## 2019-11-25 DIAGNOSIS — E7800 Pure hypercholesterolemia, unspecified: Secondary | ICD-10-CM | POA: Diagnosis not present

## 2019-11-25 DIAGNOSIS — I5032 Chronic diastolic (congestive) heart failure: Secondary | ICD-10-CM | POA: Diagnosis not present

## 2019-11-25 DIAGNOSIS — R0602 Shortness of breath: Secondary | ICD-10-CM | POA: Diagnosis not present

## 2019-11-25 DIAGNOSIS — R5383 Other fatigue: Secondary | ICD-10-CM | POA: Diagnosis not present

## 2019-11-25 DIAGNOSIS — R55 Syncope and collapse: Secondary | ICD-10-CM | POA: Diagnosis not present

## 2019-12-16 DIAGNOSIS — E7800 Pure hypercholesterolemia, unspecified: Secondary | ICD-10-CM | POA: Diagnosis not present

## 2019-12-16 DIAGNOSIS — R079 Chest pain, unspecified: Secondary | ICD-10-CM | POA: Diagnosis not present

## 2019-12-16 DIAGNOSIS — I10 Essential (primary) hypertension: Secondary | ICD-10-CM | POA: Diagnosis not present

## 2019-12-16 DIAGNOSIS — R55 Syncope and collapse: Secondary | ICD-10-CM | POA: Diagnosis not present

## 2019-12-16 DIAGNOSIS — R0602 Shortness of breath: Secondary | ICD-10-CM | POA: Diagnosis not present

## 2019-12-16 DIAGNOSIS — I5032 Chronic diastolic (congestive) heart failure: Secondary | ICD-10-CM | POA: Diagnosis not present

## 2019-12-17 ENCOUNTER — Ambulatory Visit: Payer: PPO | Attending: Internal Medicine

## 2019-12-17 DIAGNOSIS — Z23 Encounter for immunization: Secondary | ICD-10-CM | POA: Insufficient documentation

## 2019-12-17 NOTE — Progress Notes (Signed)
   Covid-19 Vaccination Clinic  Name:  Jessica Armstrong    MRN: [id number] DOB: 22-Feb-1946  12/17/2019  [name] was observed post Covid-19 immunization for 15 minutes without incident. She was provided with Vaccine Information Sheet and instruction to access the V-Safe system.   [name] was instructed to call 911 with any severe reactions post vaccine: Marland Kitchen Difficulty breathing  . Swelling of face and throat  . A fast heartbeat  . A bad rash all over body  . Dizziness and weakness   Immunizations Administered    Name Date Dose VIS Date Route   Pfizer COVID-19 Vaccine 12/17/2019  9:03 AM 0.3 mL 09/19/2019 Intramuscular   Manufacturer: [place]   Lot: [id number]   [place]: [id number]

## 2019-12-25 DIAGNOSIS — R001 Bradycardia, unspecified: Secondary | ICD-10-CM | POA: Diagnosis not present

## 2019-12-25 DIAGNOSIS — R55 Syncope and collapse: Secondary | ICD-10-CM | POA: Diagnosis not present

## 2019-12-28 DIAGNOSIS — R55 Syncope and collapse: Secondary | ICD-10-CM | POA: Diagnosis not present

## 2019-12-28 DIAGNOSIS — R001 Bradycardia, unspecified: Secondary | ICD-10-CM | POA: Diagnosis not present

## 2020-01-12 ENCOUNTER — Ambulatory Visit: Payer: Self-pay | Admitting: Family Medicine

## 2020-01-19 DIAGNOSIS — I10 Essential (primary) hypertension: Secondary | ICD-10-CM | POA: Diagnosis not present

## 2020-01-19 DIAGNOSIS — R079 Chest pain, unspecified: Secondary | ICD-10-CM | POA: Diagnosis not present

## 2020-01-19 DIAGNOSIS — R0602 Shortness of breath: Secondary | ICD-10-CM | POA: Diagnosis not present

## 2020-01-19 DIAGNOSIS — I5032 Chronic diastolic (congestive) heart failure: Secondary | ICD-10-CM | POA: Diagnosis not present

## 2020-01-19 DIAGNOSIS — E7800 Pure hypercholesterolemia, unspecified: Secondary | ICD-10-CM | POA: Diagnosis not present

## 2020-01-19 DIAGNOSIS — R55 Syncope and collapse: Secondary | ICD-10-CM | POA: Diagnosis not present

## 2020-01-24 ENCOUNTER — Other Ambulatory Visit: Payer: Self-pay | Admitting: Family Medicine

## 2020-01-24 DIAGNOSIS — E7800 Pure hypercholesterolemia, unspecified: Secondary | ICD-10-CM

## 2020-01-24 DIAGNOSIS — E782 Mixed hyperlipidemia: Secondary | ICD-10-CM

## 2020-03-18 DIAGNOSIS — H40003 Preglaucoma, unspecified, bilateral: Secondary | ICD-10-CM | POA: Diagnosis not present

## 2020-04-02 NOTE — Progress Notes (Signed)
Jessica Armstrong,acting as a scribe for Wilhemena Durie, MD.,have documented all relevant documentation on the behalf of Wilhemena Durie, MD,as directed by  Wilhemena Durie, MD while in the presence of Wilhemena Durie, MD.  Established patient visit   [name]   DOB: [birth year]   73 y.o. Female  MRN: [id number] Visit Date: 04/06/2020  Today's healthcare provider: Wilhemena Durie, MD   Chief Complaint  Patient presents with  . Hyperlipidemia  . Hypertension   Subjective    HPI  Patient complaining of feeling fatigue.  This is been going on for some time but seems worse this year. Hypertension, follow-up  BP Readings from Last 3 Encounters:  04/06/20 (!) 114/59  11/13/19 (!) 116/56  10/22/19 105/63   Wt Readings from Last 3 Encounters:  04/06/20 137 lb 12.8 oz (62.5 kg)  11/13/19 142 lb (64.4 kg)  10/16/19 144 lb 9.6 oz (65.6 kg)     She was last seen for hypertension 4 months ago.  BP at that visit was 116/56. Management since that visit includes; Patient now off of verapamil.  Push fluids and will follow up with blood pressure on her next visit in 1 to 2 months. She reports excellent compliance with treatment. She is not having side effects.  She is exercising. She is adherent to low salt diet.   Outside blood pressures are being occasionally.  She does not smoke.  Use of agents associated with hypertension: none.   --------------------------------------------------------------------------------------------------- Lipid/Cholesterol, follow-up  Last Lipid Panel: Lab Results  Component Value Date   CHOL 197 12/20/2017   LDLCALC 99 12/20/2017   HDL 49 12/20/2017   TRIG 243 (H) 12/20/2017    She was last seen for this 4 months ago.  Management since that visit includes; taking rosuvastatin 10 mg. She reports excellent compliance with treatment. She is not having side effects.  She is following a Regular diet. Current  exercise: walking  Last metabolic panel Lab Results  Component Value Date   GLUCOSE 89 10/16/2019   NA 140 10/16/2019   K 5.2 10/16/2019   BUN 24 10/16/2019   CREATININE 1.01 (H) 10/16/2019   GFRNONAA 55 (L) 10/16/2019   GFRAA 63 10/16/2019   CALCIUM 9.7 10/16/2019   AST 18 10/16/2019   ALT 15 10/16/2019   The 10-year ASCVD risk score Mikey Bussing DC Jr., et al., 2013) is: 17%  ---------------------------------------------------------------------------------------------------  Vasovagal syncope From 11/13/2019-Push fluids.  No Neurologic work-up at this time.  May do so in the future.  GERD From 11/13/2019-Added Gaviscon as needed for reflux.  She has had thorough GI work-up in the last 6 months.  Social History   Tobacco Use  . Smoking status: Never Smoker  . Smokeless tobacco: Never Used  . 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 Vaping Use: Never used  Substance Use Topics  . Alcohol use: Yes    Comment: rare use 1-2 drinks per year.   . Drug use: No       Medications: Outpatient Medications Prior to Visit  Medication Sig  . diazepam (VALIUM) 2 MG tablet Take 1 tablet (2 mg total) by mouth every 12 (twelve) hours as needed for anxiety.  . famotidine (PEPCID) 20 MG tablet Take 1 tablet (20 mg total) by mouth 2 (two) times daily.  . hydrALAZINE (APRESOLINE) 50 MG tablet Take 1 tablet (50 mg total) by mouth 3 (three)  times daily.  . meclizine (ANTIVERT) 25 MG tablet Take 1 tablet (25 mg total) by mouth 2 (two) times daily as needed for dizziness.  . metoprolol succinate (TOPROL-XL) 25 MG 24 hr tablet Take 1 tablet (25 mg total) by mouth daily.  . Multiple Vitamin (MULTIVITAMIN) tablet Take 1 tablet by mouth daily.  Marland Kitchen PARoxetine (PAXIL) 20 MG tablet TAKE ONE TABLET EVERY DAY  . rosuvastatin (CRESTOR) 10 MG tablet Take 1 tablet (10 mg total) by mouth daily.  Marland Kitchen triamterene-hydrochlorothiazide (DYAZIDE) 37.5-25 MG capsule Take 1 each  (1 capsule total) by mouth daily.  . cimetidine (TAGAMET) 400 MG tablet Take 1 tablet (400 mg total) by mouth 2 (two) times daily. (Patient not taking: Reported on 11/13/2019)   No facility-administered medications prior to visit.    Review of Systems  Constitutional: Negative for appetite change, chills, fatigue and fever.  HENT: Negative.   Eyes: Negative.   Respiratory: Negative for chest tightness and shortness of breath.   Cardiovascular: Negative for chest pain and palpitations.  Gastrointestinal: Negative for abdominal pain, nausea and vomiting.  Endocrine: Negative.   Allergic/Immunologic: Negative.   Neurological: Negative for dizziness and weakness.  Hematological: Negative.   Psychiatric/Behavioral: Negative.        Objective    BP (!) 114/59 (BP Location: Left Arm, Patient Position: Sitting, Cuff Size: Normal)   Pulse 62   Temp (!) 97.5 F (36.4 C) (Temporal)   Ht 5\' 7"  (1.702 m)   Wt 137 lb 12.8 oz (62.5 kg)   BMI 21.58 kg/m  BP Readings from Last 3 Encounters:  04/06/20 (!) 114/59  11/13/19 (!) 116/56  10/22/19 105/63   Wt Readings from Last 3 Encounters:  04/06/20 137 lb 12.8 oz (62.5 kg)  11/13/19 142 lb (64.4 kg)  10/16/19 144 lb 9.6 oz (65.6 kg)      Physical Exam Vitals reviewed.  Constitutional:      Appearance: Normal appearance. She is normal weight.  HENT:     Head: Normocephalic and atraumatic.     Right Ear: External ear normal.     Left Ear: External ear normal.     Mouth/Throat:     Pharynx: Oropharynx is clear.  Eyes:     General: No scleral icterus.    Conjunctiva/sclera: Conjunctivae normal.  Cardiovascular:     Rate and Rhythm: Normal rate and regular rhythm.     Pulses: Normal pulses.     Heart sounds: Normal heart sounds.  Pulmonary:     Effort: Pulmonary effort is normal.     Breath sounds: Normal breath sounds.  Chest:     Breasts:        Right: Normal.        Left: Normal.  Abdominal:     General: Bowel sounds are  normal.     Palpations: Abdomen is soft. There is no mass.     Tenderness: There is no abdominal tenderness.  Musculoskeletal:        General: No swelling.  Lymphadenopathy:     Cervical: No cervical adenopathy.  Skin:    General: Skin is warm and dry.  Neurological:     General: No focal deficit present.     Mental Status: She is alert and oriented to person, place, and time. Mental status is at baseline.  Psychiatric:        Mood and Affect: Mood normal.        Behavior: Behavior normal.        Thought  Content: Thought content normal.        Judgment: Judgment normal.       No results found for any visits on 04/06/20.  Assessment & Plan     1. Fatigue, unspecified type After discussion will wean patient off metoprolol and have her back in 1 month.  2. Mixed hyperlipidemia On rosuvastatin 10  3. Essential hypertension On hydralazine metoprolol Dyazide  4. Vasovagal syncope Stay hydrated, no indication syncope has been a recent issue  5. Gastroesophageal reflux disease, unspecified whether esophagitis present On famotidine  6. Chronic diastolic CHF (congestive heart failure) ([place]) Followed by Dr.  cardiology   No follow-ups on file.      I, Wilhemena Durie, MD, have reviewed all documentation for this visit. The documentation on 04/13/20 for the exam, diagnosis, procedures, and orders are all accurate and complete.    Cortny Bambach Cranford Mon, MD  [facility] [phone] (phone) [phone] (fax)  [place]

## 2020-04-06 ENCOUNTER — Ambulatory Visit (INDEPENDENT_AMBULATORY_CARE_PROVIDER_SITE_OTHER): Payer: PPO | Admitting: Family Medicine

## 2020-04-06 ENCOUNTER — Other Ambulatory Visit: Payer: Self-pay

## 2020-04-06 ENCOUNTER — Encounter: Payer: Self-pay | Admitting: Family Medicine

## 2020-04-06 VITALS — BP 114/59 | HR 62 | Temp 97.5°F | Ht 67.0 in | Wt 137.8 lb

## 2020-04-06 DIAGNOSIS — K219 Gastro-esophageal reflux disease without esophagitis: Secondary | ICD-10-CM | POA: Diagnosis not present

## 2020-04-06 DIAGNOSIS — I5032 Chronic diastolic (congestive) heart failure: Secondary | ICD-10-CM | POA: Diagnosis not present

## 2020-04-06 DIAGNOSIS — R5383 Other fatigue: Secondary | ICD-10-CM | POA: Diagnosis not present

## 2020-04-06 DIAGNOSIS — R55 Syncope and collapse: Secondary | ICD-10-CM | POA: Diagnosis not present

## 2020-04-06 DIAGNOSIS — I10 Essential (primary) hypertension: Secondary | ICD-10-CM

## 2020-04-06 DIAGNOSIS — E782 Mixed hyperlipidemia: Secondary | ICD-10-CM | POA: Diagnosis not present

## 2020-04-06 NOTE — Patient Instructions (Addendum)
TAKE METOPROLOL EVERY OTHER DAY FOR 1 WEEK THEN STOP.

## 2020-04-18 ENCOUNTER — Other Ambulatory Visit: Payer: Self-pay | Admitting: Family Medicine

## 2020-04-18 DIAGNOSIS — E7800 Pure hypercholesterolemia, unspecified: Secondary | ICD-10-CM

## 2020-04-18 DIAGNOSIS — E782 Mixed hyperlipidemia: Secondary | ICD-10-CM

## 2020-04-18 NOTE — Telephone Encounter (Signed)
Requested medication (s) are due for refill today: yes  Requested medication (s) are on the active medication list: yes  Last refill:  11/13/19  Future visit scheduled: yes  Notes to clinic:  overdue labs   Requested Prescriptions  Pending Prescriptions Disp Refills   rosuvastatin (CRESTOR) 10 MG tablet [Pharmacy Med Name: ROSUVASTATIN CALCIUM 10 MG TAB] 90 tablet     Sig: TAKE ONE TABLET EVERY DAY      Cardiovascular:  Antilipid - Statins Failed - 04/18/2020 10:41 AM      Failed - Total Cholesterol in normal range and within 360 days    Cholesterol, Total  Date Value Ref Range Status  12/20/2017 197 100 - 199 mg/dL Final          Failed - LDL in normal range and within 360 days    LDL Calculated  Date Value Ref Range Status  12/20/2017 99 0 - 99 mg/dL Final          Failed - HDL in normal range and within 360 days    HDL  Date Value Ref Range Status  12/20/2017 49 >39 mg/dL Final          Failed - Triglycerides in normal range and within 360 days    Triglycerides  Date Value Ref Range Status  12/20/2017 243 (H) 0 - 149 mg/dL Final          Passed - Patient is not pregnant      Passed - Valid encounter within last 12 months    Recent Outpatient Visits           1 week ago Fatigue, unspecified type   [facility] Jerrol Banana., MD   5 months ago Encounter for subsequent annual wellness visit (AWV) in Medicare patient   [facility] Jerrol Banana., MD   6 months ago Fall, initial encounter   [facility] Rosanna Randy, Retia Passe., MD   8 months ago Chronic diarrhea   [facility] Jerrol Banana., MD   1 year ago Chronic diarrhea   [facility] Jerrol Banana., MD       Future Appointments             In 7 months Jerrol Banana., MD [facility]             Signed Prescriptions Disp Refills   PARoxetine (PAXIL) 20 MG tablet  90 tablet 2    Sig: TAKE 1 TABLET BY MOUTH DAILY      Psychiatry:  Antidepressants - SSRI Passed - 04/18/2020 10:41 AM      Passed - Valid encounter within last 6 months    Recent Outpatient Visits           1 week ago Fatigue, unspecified type   [facility] Jerrol Banana., MD   5 months ago Encounter for subsequent annual wellness visit (AWV) in Medicare patient   [facility] Jerrol Banana., MD   6 months ago Fall, initial encounter   [facility] Jerrol Banana., MD   8 months ago Chronic diarrhea   [facility] Jerrol Banana., MD   1 year ago Chronic diarrhea   [facility] Jerrol Banana., MD       Future Appointments             In 7 months  Jerrol Banana., MD [facility] [place]

## 2020-04-18 NOTE — Telephone Encounter (Signed)
Requested Prescriptions  Pending Prescriptions Disp Refills  . rosuvastatin (CRESTOR) 10 MG tablet [Pharmacy Med Name: ROSUVASTATIN CALCIUM 10 MG TAB] 90 tablet     Sig: TAKE ONE TABLET EVERY DAY     Cardiovascular:  Antilipid - Statins Failed - 04/18/2020 10:41 AM      Failed - Total Cholesterol in normal range and within 360 days    Cholesterol, Total  Date Value Ref Range Status  12/20/2017 197 100 - 199 mg/dL Final         Failed - LDL in normal range and within 360 days    LDL Calculated  Date Value Ref Range Status  12/20/2017 99 0 - 99 mg/dL Final         Failed - HDL in normal range and within 360 days    HDL  Date Value Ref Range Status  12/20/2017 49 >39 mg/dL Final         Failed - Triglycerides in normal range and within 360 days    Triglycerides  Date Value Ref Range Status  12/20/2017 243 (H) 0 - 149 mg/dL Final         Passed - Patient is not pregnant      Passed - Valid encounter within last 12 months    Recent Outpatient Visits          1 week ago Fatigue, unspecified type   [facility] Jerrol Banana., MD   5 months ago Encounter for subsequent annual wellness visit (AWV) in Medicare patient   [facility] Jerrol Banana., MD   6 months ago Fall, initial encounter   [facility] Jerrol Banana., MD   8 months ago Chronic diarrhea   [facility] Jerrol Banana., MD   1 year ago Chronic diarrhea   [facility] Jerrol Banana., MD      Future Appointments            In 7 months Jerrol Banana., MD [facility]           . PARoxetine (PAXIL) 20 MG tablet [Pharmacy Med Name: PAROXETINE HCL 20 MG TAB] 90 tablet 2    Sig: TAKE 1 TABLET BY MOUTH DAILY     Psychiatry:  Antidepressants - SSRI Passed - 04/18/2020 10:41 AM      Passed - Valid encounter within last 6 months    Recent Outpatient Visits          1 week ago  Fatigue, unspecified type   [facility] Jerrol Banana., MD   5 months ago Encounter for subsequent annual wellness visit (AWV) in Medicare patient   [facility] Jerrol Banana., MD   6 months ago Fall, initial encounter   [facility] Jerrol Banana., MD   8 months ago Chronic diarrhea   [facility] Jerrol Banana., MD   1 year ago Chronic diarrhea   [facility] Jerrol Banana., MD      Future Appointments            In 7 months Jerrol Banana., MD [facility] [place]

## 2020-04-26 DIAGNOSIS — E7800 Pure hypercholesterolemia, unspecified: Secondary | ICD-10-CM | POA: Diagnosis not present

## 2020-04-26 DIAGNOSIS — I10 Essential (primary) hypertension: Secondary | ICD-10-CM | POA: Diagnosis not present

## 2020-04-26 DIAGNOSIS — R0602 Shortness of breath: Secondary | ICD-10-CM | POA: Diagnosis not present

## 2020-04-26 DIAGNOSIS — I5032 Chronic diastolic (congestive) heart failure: Secondary | ICD-10-CM | POA: Diagnosis not present

## 2020-04-26 DIAGNOSIS — R55 Syncope and collapse: Secondary | ICD-10-CM | POA: Diagnosis not present

## 2020-04-27 DIAGNOSIS — I509 Heart failure, unspecified: Secondary | ICD-10-CM | POA: Diagnosis not present

## 2020-04-27 DIAGNOSIS — H2512 Age-related nuclear cataract, left eye: Secondary | ICD-10-CM | POA: Diagnosis not present

## 2020-04-29 ENCOUNTER — Encounter: Payer: Self-pay | Admitting: Ophthalmology

## 2020-05-03 ENCOUNTER — Other Ambulatory Visit
Admission: RE | Admit: 2020-05-03 | Discharge: 2020-05-03 | Disposition: A | Discharge status: Home / Self Care | Payer: PPO | Source: Ambulatory Visit | Attending: Ophthalmology | Admitting: Ophthalmology

## 2020-05-03 ENCOUNTER — Other Ambulatory Visit: Payer: Self-pay

## 2020-05-03 DIAGNOSIS — Z01812 Encounter for preprocedural laboratory examination: Secondary | ICD-10-CM | POA: Diagnosis not present

## 2020-05-03 DIAGNOSIS — Z20822 Contact with and (suspected) exposure to covid-19: Secondary | ICD-10-CM | POA: Diagnosis not present

## 2020-05-03 NOTE — Discharge Instructions (Signed)

## 2020-05-04 ENCOUNTER — Other Ambulatory Visit: Payer: PPO

## 2020-05-04 LAB — SARS CORONAVIRUS 2 (TAT 6-24 HRS): SARS Coronavirus 2: NEGATIVE

## 2020-05-05 ENCOUNTER — Other Ambulatory Visit: Payer: Self-pay

## 2020-05-05 ENCOUNTER — Ambulatory Visit: Payer: PPO | Admitting: Anesthesiology

## 2020-05-05 ENCOUNTER — Encounter: Payer: Self-pay | Admitting: Ophthalmology

## 2020-05-05 ENCOUNTER — Ambulatory Visit
Admission: RE | Admit: 2020-05-05 | Discharge: 2020-05-05 | Disposition: A | Discharge status: Home / Self Care | Payer: PPO | Attending: Ophthalmology | Admitting: Ophthalmology

## 2020-05-05 ENCOUNTER — Encounter
Admission: RE | Disposition: A | Discharge status: Home / Self Care | Payer: Self-pay | Source: Home / Self Care | Attending: Ophthalmology

## 2020-05-05 DIAGNOSIS — I110 Hypertensive heart disease with heart failure: Secondary | ICD-10-CM | POA: Insufficient documentation

## 2020-05-05 DIAGNOSIS — I5030 Unspecified diastolic (congestive) heart failure: Secondary | ICD-10-CM | POA: Diagnosis not present

## 2020-05-05 DIAGNOSIS — H25812 Combined forms of age-related cataract, left eye: Secondary | ICD-10-CM | POA: Diagnosis not present

## 2020-05-05 DIAGNOSIS — Z79899 Other long term (current) drug therapy: Secondary | ICD-10-CM | POA: Insufficient documentation

## 2020-05-05 DIAGNOSIS — F419 Anxiety disorder, unspecified: Secondary | ICD-10-CM | POA: Diagnosis not present

## 2020-05-05 DIAGNOSIS — M26609 Unspecified temporomandibular joint disorder, unspecified side: Secondary | ICD-10-CM | POA: Diagnosis not present

## 2020-05-05 DIAGNOSIS — H2512 Age-related nuclear cataract, left eye: Secondary | ICD-10-CM | POA: Insufficient documentation

## 2020-05-05 DIAGNOSIS — F329 Major depressive disorder, single episode, unspecified: Secondary | ICD-10-CM | POA: Insufficient documentation

## 2020-05-05 DIAGNOSIS — K589 Irritable bowel syndrome without diarrhea: Secondary | ICD-10-CM | POA: Insufficient documentation

## 2020-05-05 DIAGNOSIS — E7800 Pure hypercholesterolemia, unspecified: Secondary | ICD-10-CM | POA: Insufficient documentation

## 2020-05-05 DIAGNOSIS — K219 Gastro-esophageal reflux disease without esophagitis: Secondary | ICD-10-CM | POA: Diagnosis not present

## 2020-05-05 HISTORY — PX: CATARACT EXTRACTION W/PHACO: SHX586

## 2020-05-05 HISTORY — DX: Unspecified temporomandibular joint disorder, unspecified side: M26.609

## 2020-05-05 HISTORY — DX: Allergy, unspecified, initial encounter: T78.40XA

## 2020-05-05 HISTORY — DX: Cardiac arrhythmia, unspecified: I49.9

## 2020-05-05 HISTORY — DX: Dizziness and giddiness: R42

## 2020-05-05 HISTORY — DX: Irritable bowel syndrome, unspecified: K58.9

## 2020-05-05 HISTORY — DX: Diverticulosis of intestine, part unspecified, without perforation or abscess without bleeding: K57.90

## 2020-05-05 SURGERY — PHACOEMULSIFICATION, CATARACT, WITH IOL INSERTION
Anesthesia: Monitor Anesthesia Care | Site: Eye | Laterality: Left

## 2020-05-05 MED ORDER — FENTANYL CITRATE (PF) 100 MCG/2ML IJ SOLN
INTRAMUSCULAR | Status: DC | PRN
  Administered 2020-05-05: 50 ug via INTRAVENOUS

## 2020-05-05 MED ORDER — ACETAMINOPHEN 160 MG/5ML PO SOLN: 325.0000 mg | ORAL | Status: DC | PRN

## 2020-05-05 MED ORDER — BRIMONIDINE TARTRATE-TIMOLOL 0.2-0.5 % OP SOLN
OPHTHALMIC | Status: DC | PRN
  Administered 2020-05-05: 1 [drp] via OPHTHALMIC

## 2020-05-05 MED ORDER — EPINEPHRINE PF 1 MG/ML IJ SOLN
INTRAOCULAR | Status: DC | PRN
  Administered 2020-05-05: 60 mL via OPHTHALMIC

## 2020-05-05 MED ORDER — ACETAMINOPHEN 325 MG PO TABS: 325.0000 mg | ORAL_TABLET | ORAL | Status: DC | PRN

## 2020-05-05 MED ORDER — MOXIFLOXACIN HCL 0.5 % OP SOLN
1.0000 [drp] | OPHTHALMIC | Status: DC | PRN
  Administered 2020-05-05 (×3): 1 [drp] via OPHTHALMIC

## 2020-05-05 MED ORDER — ONDANSETRON HCL 4 MG/2ML IJ SOLN
INTRAMUSCULAR | Status: DC | PRN
Start: 2020-05-05 — End: 2020-05-05
  Administered 2020-05-05: 4 mg via INTRAVENOUS

## 2020-05-05 MED ORDER — NA HYALUR & NA CHOND-NA HYALUR 0.4-0.35 ML IO KIT
PACK | INTRAOCULAR | Status: DC | PRN
  Administered 2020-05-05: 1 mL via INTRAOCULAR

## 2020-05-05 MED ORDER — ONDANSETRON HCL 4 MG/2ML IJ SOLN: 4.0000 mg | Freq: Once | INTRAMUSCULAR | Status: DC | PRN

## 2020-05-05 MED ORDER — CEFUROXIME OPHTHALMIC INJECTION 1 MG/0.1 ML
INJECTION | OPHTHALMIC | Status: DC | PRN
  Administered 2020-05-05: 0.1 mL via INTRACAMERAL

## 2020-05-05 MED ORDER — MIDAZOLAM HCL 2 MG/2ML IJ SOLN
INTRAMUSCULAR | Status: DC | PRN
  Administered 2020-05-05: 2 mg via INTRAVENOUS

## 2020-05-05 MED ORDER — LIDOCAINE HCL (PF) 2 % IJ SOLN
INTRAOCULAR | Status: DC | PRN
  Administered 2020-05-05: 1 mL

## 2020-05-05 MED ORDER — ARMC OPHTHALMIC DILATING DROPS
1.0000 "application " | OPHTHALMIC | Status: DC | PRN
  Administered 2020-05-05 (×3): 1 via OPHTHALMIC

## 2020-05-05 MED ORDER — LACTATED RINGERS IV SOLN: INTRAVENOUS | Status: DC

## 2020-05-05 MED ORDER — TETRACAINE HCL 0.5 % OP SOLN
1.0000 [drp] | OPHTHALMIC | Status: DC | PRN
  Administered 2020-05-05 (×3): 1 [drp] via OPHTHALMIC

## 2020-05-05 SURGICAL SUPPLY — 28 items
CANNULA ANT/CHMB 27G (MISCELLANEOUS) ×1 IMPLANT
CANNULA ANT/CHMB 27GA (MISCELLANEOUS) ×2 IMPLANT
GLOVE SURG LX 7.5 STRW (GLOVE) ×2
GLOVE SURG LX STRL 7.5 STRW (GLOVE) ×1 IMPLANT
GLOVE SURG TRIUMPH 8.0 PF LTX (GLOVE) ×2 IMPLANT
GOWN STRL REUS W/ TWL LRG LVL3 (GOWN DISPOSABLE) ×2 IMPLANT
GOWN STRL REUS W/TWL LRG LVL3 (GOWN DISPOSABLE) ×4
LENS IOL TECNIS 5.0 (Intraocular Lens) ×1 IMPLANT
MARKER SKIN DUAL TIP RULER LAB (MISCELLANEOUS) ×2 IMPLANT
NDL CAPSULORHEX 25GA (NEEDLE) ×1 IMPLANT
NDL FILTER BLUNT 18X1 1/2 (NEEDLE) ×2 IMPLANT
NDL RETROBULBAR .5 NSTRL (NEEDLE) IMPLANT
NEEDLE CAPSULORHEX 25GA (NEEDLE) ×2 IMPLANT
NEEDLE FILTER BLUNT 18X 1/2SAF (NEEDLE) ×2
NEEDLE FILTER BLUNT 18X1 1/2 (NEEDLE) ×2 IMPLANT
PACK CATARACT BRASINGTON (MISCELLANEOUS) ×2 IMPLANT
PACK EYE AFTER SURG (MISCELLANEOUS) ×2 IMPLANT
PACK OPTHALMIC (MISCELLANEOUS) ×2 IMPLANT
RING MALYGIN 7.0 (MISCELLANEOUS) IMPLANT
SOLUTION OPHTHALMIC SALT (MISCELLANEOUS) ×2 IMPLANT
SUT ETHILON 10-0 CS-B-6CS-B-6 (SUTURE)
SUT VICRYL  9 0 (SUTURE)
SUT VICRYL 9 0 (SUTURE) IMPLANT
SUTURE EHLN 10-0 CS-B-6CS-B-6 (SUTURE) IMPLANT
SYR 3ML LL SCALE MARK (SYRINGE) ×4 IMPLANT
SYR TB 1ML LUER SLIP (SYRINGE) ×2 IMPLANT
WATER STERILE IRR 250ML POUR (IV SOLUTION) ×2 IMPLANT
WIPE NON LINTING 3.25X3.25 (MISCELLANEOUS) ×2 IMPLANT

## 2020-05-05 NOTE — H&P (Signed)

## 2020-05-05 NOTE — Anesthesia Postprocedure Evaluation (Signed)
Anesthesia Post Note  [name] [facility]  Procedure(s) Performed: CATARACT EXTRACTION PHACO AND INTRAOCULAR LENS PLACEMENT (IOC) LEFT (Left Eye)     Patient location during evaluation: PACU Anesthesia Type: MAC Level of consciousness: awake and alert Pain management: pain level controlled Vital Signs Assessment: post-procedure vital signs reviewed and stable Respiratory status: spontaneous breathing, nonlabored ventilation, respiratory function stable and patient connected to nasal cannula oxygen Cardiovascular status: stable and blood pressure returned to baseline Postop Assessment: no apparent nausea or vomiting Anesthetic complications: no   No complications documented.  Alisa Graff

## 2020-05-05 NOTE — Transfer of Care (Signed)
Immediate Anesthesia Transfer of Care Note  [name]  Procedure(s) Performed: CATARACT EXTRACTION PHACO AND INTRAOCULAR LENS PLACEMENT (IOC) LEFT (Left Eye)  Patient Location: PACU  Anesthesia Type: MAC  Level of Consciousness: awake, alert  and patient cooperative  Airway and Oxygen Therapy: Patient Spontanous Breathing and Patient connected to supplemental oxygen  Post-op Assessment: Post-op Vital signs reviewed, Patient's Cardiovascular Status Stable, Respiratory Function Stable, Patent Airway and No signs of Nausea or vomiting  Post-op Vital Signs: Reviewed and stable  Complications: No complications documented.

## 2020-05-05 NOTE — Anesthesia Procedure Notes (Signed)
Procedure Name: MAC Date/Time: 05/05/2020 8:17 AM Performed by: Jeannene Patella, CRNA Pre-anesthesia Checklist: Patient identified, Emergency Drugs available, Suction available, Patient being monitored and Timeout performed Patient Re-evaluated:Patient Re-evaluated prior to induction Oxygen Delivery Method: Nasal cannula

## 2020-05-05 NOTE — Anesthesia Preprocedure Evaluation (Signed)
Anesthesia Evaluation  Patient identified by MRN, date of birth, ID band Patient awake    Reviewed: Allergy & Precautions, H&P , NPO status , Patient's Chart, lab work & pertinent test results, reviewed documented beta blocker date and time   History of Anesthesia Complications (+) PONV and history of anesthetic complications  Airway Mallampati: II  TM Distance: >3 FB Neck ROM: full    Dental no notable dental hx.    Pulmonary neg pulmonary ROS,    Pulmonary exam normal breath sounds clear to auscultation       Cardiovascular Exercise Tolerance: Good hypertension, +CHF (diastolic)  + Valvular Problems/Murmurs (moderate TR, PASP 53)  Rhythm:regular Rate:Normal     Neuro/Psych PSYCHIATRIC DISORDERS Anxiety Depression negative neurological ROS     GI/Hepatic Neg liver ROS, GERD  ,IBS   Endo/Other  negative endocrine ROS  Renal/GU negative Renal ROS  negative genitourinary   Musculoskeletal   Abdominal   Peds  Hematology negative hematology ROS (+)   Anesthesia Other Findings   Reproductive/Obstetrics negative OB ROS                             Anesthesia Physical Anesthesia Plan  ASA: III  Anesthesia Plan: MAC   Post-op Pain Management:    Induction:   PONV Risk Score and Plan: 3 and Ondansetron, Treatment may vary due to age or medical condition and TIVA  Airway Management Planned:   Additional Equipment:   Intra-op Plan:   Post-operative Plan:   Informed Consent: I have reviewed the patients History and Physical, chart, labs and discussed the procedure including the risks, benefits and alternatives for the proposed anesthesia with the patient or authorized representative who has indicated his/her understanding and acceptance.     Dental Advisory Given  Plan Discussed with: CRNA  Anesthesia Plan Comments:         Anesthesia Quick Evaluation

## 2020-05-05 NOTE — Op Note (Signed)
OPERATIVE NOTE  Jessica Armstrong [facility] [id number] 05/05/2020   PREOPERATIVE DIAGNOSIS:  Nuclear sclerotic cataract left eye. H25.12   POSTOPERATIVE DIAGNOSIS:    Nuclear sclerotic cataract left eye.     PROCEDURE:  Phacoemusification with posterior chamber intraocular lens placement of the left eye  Ultrasound time: Procedure(s) with comments: CATARACT EXTRACTION PHACO AND INTRAOCULAR LENS PLACEMENT (IOC) LEFT (Left) - 3.80 0:43.4 8.8%  LENS:   Implant Name Type Inv. Item Serial No. Manufacturer Lot No. LRB No. Used Action  LENS IOL TECNIS 5.0 - [id number] Intraocular Lens LENS IOL TECNIS 5.0 [id number] [employer]  Left 1 Implanted      SURGEON:  Wyonia Hough, MD   ANESTHESIA:  Topical with tetracaine drops and 2% Xylocaine jelly, augmented with 1% preservative-free intracameral lidocaine.    COMPLICATIONS:  None.   DESCRIPTION OF PROCEDURE:  The patient was identified in the holding room and transported to the operating room and placed in the supine position under the operating microscope.  The left eye was identified as the operative eye and it was prepped and draped in the usual sterile ophthalmic fashion.   A 1 millimeter clear-corneal paracentesis was made at the 1:30 position.  0.5 ml of preservative-free 1% lidocaine was injected into the anterior chamber.  The anterior chamber was filled with Viscoat viscoelastic.  A 2.4 millimeter keratome was used to make a near-clear corneal incision at the 10:30 position.  .  A curvilinear capsulorrhexis was made with a cystotome and capsulorrhexis forceps.  Balanced salt solution was used to hydrodissect and hydrodelineate the nucleus.   Phacoemulsification was then used in stop and chop fashion to remove the lens nucleus and epinucleus.  The remaining cortex was then removed using the irrigation and aspiration handpiece. Provisc was then placed into the capsular bag to distend it for lens placement.  A lens was then  injected into the capsular bag.  The remaining viscoelastic was aspirated.   Wounds were hydrated with balanced salt solution.  The anterior chamber was inflated to a physiologic pressure with balanced salt solution.  No wound leaks were noted. Cefuroxime 0.1 ml of a 10mg /ml solution was injected into the anterior chamber for a dose of 1 mg of intracameral antibiotic at the completion of the case.   Timolol and Brimonidine drops were applied to the eye.  The patient was taken to the recovery room in stable condition without complications of anesthesia or surgery.  [name] 05/05/2020, 8:33 AM

## 2020-05-06 ENCOUNTER — Encounter: Payer: Self-pay | Admitting: Ophthalmology

## 2020-05-24 DIAGNOSIS — H2511 Age-related nuclear cataract, right eye: Secondary | ICD-10-CM | POA: Diagnosis not present

## 2020-05-24 DIAGNOSIS — I10 Essential (primary) hypertension: Secondary | ICD-10-CM | POA: Diagnosis not present

## 2020-05-26 ENCOUNTER — Other Ambulatory Visit: Payer: Self-pay

## 2020-05-26 ENCOUNTER — Encounter: Payer: Self-pay | Admitting: Ophthalmology

## 2020-05-31 ENCOUNTER — Other Ambulatory Visit
Admission: RE | Admit: 2020-05-31 | Discharge: 2020-05-31 | Disposition: A | Discharge status: Home / Self Care | Payer: PPO | Source: Ambulatory Visit | Attending: Ophthalmology | Admitting: Ophthalmology

## 2020-05-31 ENCOUNTER — Other Ambulatory Visit: Payer: Self-pay

## 2020-05-31 DIAGNOSIS — Z01812 Encounter for preprocedural laboratory examination: Secondary | ICD-10-CM | POA: Diagnosis not present

## 2020-05-31 DIAGNOSIS — Z20822 Contact with and (suspected) exposure to covid-19: Secondary | ICD-10-CM | POA: Diagnosis not present

## 2020-06-01 LAB — SARS CORONAVIRUS 2 (TAT 6-24 HRS): SARS Coronavirus 2: NEGATIVE

## 2020-06-01 NOTE — Discharge Instructions (Signed)

## 2020-06-02 ENCOUNTER — Other Ambulatory Visit: Payer: Self-pay

## 2020-06-02 ENCOUNTER — Ambulatory Visit
Admission: RE | Admit: 2020-06-02 | Discharge: 2020-06-02 | Disposition: A | Discharge status: Home / Self Care | Payer: PPO | Attending: Ophthalmology | Admitting: Ophthalmology

## 2020-06-02 ENCOUNTER — Encounter: Payer: Self-pay | Admitting: Ophthalmology

## 2020-06-02 ENCOUNTER — Ambulatory Visit: Payer: PPO | Admitting: Anesthesiology

## 2020-06-02 ENCOUNTER — Encounter
Admission: RE | Disposition: A | Discharge status: Home / Self Care | Payer: Self-pay | Source: Home / Self Care | Attending: Ophthalmology

## 2020-06-02 DIAGNOSIS — K219 Gastro-esophageal reflux disease without esophagitis: Secondary | ICD-10-CM | POA: Diagnosis not present

## 2020-06-02 DIAGNOSIS — F419 Anxiety disorder, unspecified: Secondary | ICD-10-CM | POA: Diagnosis not present

## 2020-06-02 DIAGNOSIS — I509 Heart failure, unspecified: Secondary | ICD-10-CM | POA: Insufficient documentation

## 2020-06-02 DIAGNOSIS — K589 Irritable bowel syndrome without diarrhea: Secondary | ICD-10-CM | POA: Diagnosis not present

## 2020-06-02 DIAGNOSIS — E1151 Type 2 diabetes mellitus with diabetic peripheral angiopathy without gangrene: Secondary | ICD-10-CM | POA: Insufficient documentation

## 2020-06-02 DIAGNOSIS — Z882 Allergy status to sulfonamides status: Secondary | ICD-10-CM | POA: Insufficient documentation

## 2020-06-02 DIAGNOSIS — E785 Hyperlipidemia, unspecified: Secondary | ICD-10-CM | POA: Insufficient documentation

## 2020-06-02 DIAGNOSIS — Z885 Allergy status to narcotic agent status: Secondary | ICD-10-CM | POA: Insufficient documentation

## 2020-06-02 DIAGNOSIS — M1990 Unspecified osteoarthritis, unspecified site: Secondary | ICD-10-CM | POA: Diagnosis not present

## 2020-06-02 DIAGNOSIS — I2510 Atherosclerotic heart disease of native coronary artery without angina pectoris: Secondary | ICD-10-CM | POA: Diagnosis not present

## 2020-06-02 DIAGNOSIS — H2511 Age-related nuclear cataract, right eye: Secondary | ICD-10-CM | POA: Diagnosis not present

## 2020-06-02 DIAGNOSIS — I110 Hypertensive heart disease with heart failure: Secondary | ICD-10-CM | POA: Insufficient documentation

## 2020-06-02 DIAGNOSIS — F329 Major depressive disorder, single episode, unspecified: Secondary | ICD-10-CM | POA: Diagnosis not present

## 2020-06-02 DIAGNOSIS — Z881 Allergy status to other antibiotic agents status: Secondary | ICD-10-CM | POA: Diagnosis not present

## 2020-06-02 DIAGNOSIS — H25811 Combined forms of age-related cataract, right eye: Secondary | ICD-10-CM | POA: Diagnosis not present

## 2020-06-02 DIAGNOSIS — Z888 Allergy status to other drugs, medicaments and biological substances status: Secondary | ICD-10-CM | POA: Insufficient documentation

## 2020-06-02 DIAGNOSIS — E1136 Type 2 diabetes mellitus with diabetic cataract: Secondary | ICD-10-CM | POA: Diagnosis not present

## 2020-06-02 HISTORY — PX: CATARACT EXTRACTION W/PHACO: SHX586

## 2020-06-02 SURGERY — PHACOEMULSIFICATION, CATARACT, WITH IOL INSERTION
Anesthesia: Monitor Anesthesia Care | Site: Eye | Laterality: Right

## 2020-06-02 MED ORDER — FENTANYL CITRATE (PF) 100 MCG/2ML IJ SOLN
INTRAMUSCULAR | Status: DC | PRN
Start: 2020-06-02 — End: 2020-06-02
  Administered 2020-06-02: 50 ug via INTRAVENOUS

## 2020-06-02 MED ORDER — TETRACAINE HCL 0.5 % OP SOLN
1.0000 [drp] | OPHTHALMIC | Status: DC | PRN
  Administered 2020-06-02 (×3): 1 [drp] via OPHTHALMIC

## 2020-06-02 MED ORDER — NA HYALUR & NA CHOND-NA HYALUR 0.4-0.35 ML IO KIT
PACK | INTRAOCULAR | Status: DC | PRN
  Administered 2020-06-02: 1 mL via INTRAOCULAR

## 2020-06-02 MED ORDER — ARMC OPHTHALMIC DILATING DROPS
1.0000 "application " | OPHTHALMIC | Status: DC | PRN
  Administered 2020-06-02 (×3): 1 via OPHTHALMIC

## 2020-06-02 MED ORDER — BRIMONIDINE TARTRATE-TIMOLOL 0.2-0.5 % OP SOLN
OPHTHALMIC | Status: DC | PRN
  Administered 2020-06-02: 1 [drp] via OPHTHALMIC

## 2020-06-02 MED ORDER — LIDOCAINE HCL (PF) 2 % IJ SOLN
INTRAOCULAR | Status: DC | PRN
  Administered 2020-06-02: 1 mL

## 2020-06-02 MED ORDER — LACTATED RINGERS IV SOLN: INTRAVENOUS | Status: DC

## 2020-06-02 MED ORDER — CEFUROXIME OPHTHALMIC INJECTION 1 MG/0.1 ML
INJECTION | OPHTHALMIC | Status: DC | PRN
  Administered 2020-06-02: 0.1 mL via INTRACAMERAL

## 2020-06-02 MED ORDER — ACETAMINOPHEN 10 MG/ML IV SOLN: 1000.0000 mg | Freq: Once | INTRAVENOUS | Status: DC | PRN

## 2020-06-02 MED ORDER — MOXIFLOXACIN HCL 0.5 % OP SOLN
1.0000 [drp] | OPHTHALMIC | Status: DC | PRN
  Administered 2020-06-02 (×3): 1 [drp] via OPHTHALMIC

## 2020-06-02 MED ORDER — ONDANSETRON HCL 4 MG/2ML IJ SOLN: 4.0000 mg | Freq: Once | INTRAMUSCULAR | Status: DC | PRN

## 2020-06-02 MED ORDER — EPINEPHRINE PF 1 MG/ML IJ SOLN
INTRAOCULAR | Status: DC | PRN
  Administered 2020-06-02: 61 mL via OPHTHALMIC

## 2020-06-02 MED ORDER — MIDAZOLAM HCL 2 MG/2ML IJ SOLN
INTRAMUSCULAR | Status: DC | PRN
  Administered 2020-06-02 (×2): 1 mg via INTRAVENOUS

## 2020-06-02 SURGICAL SUPPLY — 29 items
CANNULA ANT/CHMB 27G (MISCELLANEOUS) ×1 IMPLANT
CANNULA ANT/CHMB 27GA (MISCELLANEOUS) ×2 IMPLANT
GLOVE SURG LX 7.5 STRW (GLOVE) ×2
GLOVE SURG LX STRL 7.5 STRW (GLOVE) ×1 IMPLANT
GLOVE SURG TRIUMPH 8.0 PF LTX (GLOVE) ×2 IMPLANT
GOWN STRL REUS W/ TWL LRG LVL3 (GOWN DISPOSABLE) ×2 IMPLANT
GOWN STRL REUS W/TWL LRG LVL3 (GOWN DISPOSABLE) ×4
LENS IOL DIOP 15.0 (Intraocular Lens) ×2 IMPLANT
LENS IOL TECNIS MONO 15.0 (Intraocular Lens) IMPLANT
MARKER SKIN DUAL TIP RULER LAB (MISCELLANEOUS) ×2 IMPLANT
NDL CAPSULORHEX 25GA (NEEDLE) ×1 IMPLANT
NDL FILTER BLUNT 18X1 1/2 (NEEDLE) ×2 IMPLANT
NDL RETROBULBAR .5 NSTRL (NEEDLE) IMPLANT
NEEDLE CAPSULORHEX 25GA (NEEDLE) ×2 IMPLANT
NEEDLE FILTER BLUNT 18X 1/2SAF (NEEDLE) ×2
NEEDLE FILTER BLUNT 18X1 1/2 (NEEDLE) ×2 IMPLANT
PACK CATARACT BRASINGTON (MISCELLANEOUS) ×2 IMPLANT
PACK EYE AFTER SURG (MISCELLANEOUS) ×2 IMPLANT
PACK OPTHALMIC (MISCELLANEOUS) ×2 IMPLANT
RING MALYGIN 7.0 (MISCELLANEOUS) IMPLANT
SOLUTION OPHTHALMIC SALT (MISCELLANEOUS) ×2 IMPLANT
SUT ETHILON 10-0 CS-B-6CS-B-6 (SUTURE)
SUT VICRYL  9 0 (SUTURE)
SUT VICRYL 9 0 (SUTURE) IMPLANT
SUTURE EHLN 10-0 CS-B-6CS-B-6 (SUTURE) IMPLANT
SYR 3ML LL SCALE MARK (SYRINGE) ×4 IMPLANT
SYR TB 1ML LUER SLIP (SYRINGE) ×2 IMPLANT
WATER STERILE IRR 250ML POUR (IV SOLUTION) ×2 IMPLANT
WIPE NON LINTING 3.25X3.25 (MISCELLANEOUS) ×2 IMPLANT

## 2020-06-02 NOTE — Anesthesia Preprocedure Evaluation (Signed)
Anesthesia Evaluation  Patient identified by MRN, date of birth, ID band Patient awake    Reviewed: Allergy & Precautions, H&P , NPO status , Patient's Chart, lab work & pertinent test results, reviewed documented beta blocker date and time   History of Anesthesia Complications (+) PONV and history of anesthetic complications  Airway Mallampati: II  TM Distance: >3 FB Neck ROM: full    Dental no notable dental hx.    Pulmonary    Pulmonary exam normal breath sounds clear to auscultation       Cardiovascular Exercise Tolerance: Poor hypertension, (-) angina+ Peripheral Vascular Disease (w/ claudication, coronary artery disease), +CHF (diastolic) and + DOE (Chronic, stable)  + Valvular Problems/Murmurs (moderate TR, PASP 53)  Rhythm:regular Rate:Normal   HLD   Neuro/Psych PSYCHIATRIC DISORDERS Anxiety Depression  Neuromuscular disease (Cervical spine disease)    GI/Hepatic GERD  Controlled, IBS Diverticulosis   Endo/Other    Renal/GU      Musculoskeletal  (+) Arthritis , Osteoarthritis,    Abdominal   Peds  Hematology  (+) anemia ,   Anesthesia Other Findings TMJ  Reproductive/Obstetrics                             Anesthesia Physical  Anesthesia Plan  ASA: III  Anesthesia Plan: MAC   Post-op Pain Management:    Induction: Intravenous  PONV Risk Score and Plan: 3 and Ondansetron, Treatment may vary due to age or medical condition, TIVA and Midazolam  Airway Management Planned: Nasal Cannula  Additional Equipment:   Intra-op Plan:   Post-operative Plan:   Informed Consent: I have reviewed the patients History and Physical, chart, labs and discussed the procedure including the risks, benefits and alternatives for the proposed anesthesia with the patient or authorized representative who has indicated his/her understanding and acceptance.     Dental Advisory Given  Plan  Discussed with: CRNA  Anesthesia Plan Comments:         Anesthesia Quick Evaluation

## 2020-06-02 NOTE — Op Note (Signed)
LOCATION:  [place]   PREOPERATIVE DIAGNOSIS:    Nuclear sclerotic cataract right eye. H25.11   POSTOPERATIVE DIAGNOSIS:  Nuclear sclerotic cataract right eye.     PROCEDURE:  Phacoemusification with posterior chamber intraocular lens placement of the right eye   ULTRASOUND TIME: Procedure(s) with comments: CATARACT EXTRACTION PHACO AND INTRAOCULAR LENS PLACEMENT (IOC) RIGHT (Right) - 4.97 0:57.8 8.6%  LENS:   Implant Name Type Inv. Item Serial No. Manufacturer Lot No. LRB No. Used Action  LENS IOL DIOP 15.0 - [id number] Intraocular Lens LENS IOL DIOP 15.0 [id number] [employer]  Right 1 Implanted         SURGEON:  Wyonia Hough, MD   ANESTHESIA:  Topical with tetracaine drops and 2% Xylocaine jelly, augmented with 1% preservative-free intracameral lidocaine.    COMPLICATIONS:  None.   DESCRIPTION OF PROCEDURE:  The patient was identified in the holding room and transported to the operating room and placed in the supine position under the operating microscope.  The right eye was identified as the operative eye and it was prepped and draped in the usual sterile ophthalmic fashion.   A 1 millimeter clear-corneal paracentesis was made at the 12:00 position.  0.5 ml of preservative-free 1% lidocaine was injected into the anterior chamber. The anterior chamber was filled with Viscoat viscoelastic.  A 2.4 millimeter keratome was used to make a near-clear corneal incision at the 9:00 position.  A curvilinear capsulorrhexis was made with a cystotome and capsulorrhexis forceps.  Balanced salt solution was used to hydrodissect and hydrodelineate the nucleus.   Phacoemulsification was then used in stop and chop fashion to remove the lens nucleus and epinucleus.  The remaining cortex was then removed using the irrigation and aspiration handpiece. Provisc was then placed into the capsular bag to distend it for lens placement.  A lens was then injected into the  capsular bag.  The remaining viscoelastic was aspirated.   Wounds were hydrated with balanced salt solution.  The anterior chamber was inflated to a physiologic pressure with balanced salt solution.  No wound leaks were noted. Cefuroxime 0.1 ml of a 10mg /ml solution was injected into the anterior chamber for a dose of 1 mg of intracameral antibiotic at the completion of the case.   Timolol and Brimonidine drops were applied to the eye.  The patient was taken to the recovery room in stable condition without complications of anesthesia or surgery.   Jessica Armstrong 06/02/2020, 9:26 AM

## 2020-06-02 NOTE — Anesthesia Postprocedure Evaluation (Signed)
Anesthesia Post Note  [name] [facility]  Procedure(s) Performed: CATARACT EXTRACTION PHACO AND INTRAOCULAR LENS PLACEMENT (IOC) RIGHT (Right Eye)     Patient location during evaluation: PACU Anesthesia Type: MAC Level of consciousness: awake and alert Pain management: pain level controlled Vital Signs Assessment: post-procedure vital signs reviewed and stable Respiratory status: spontaneous breathing, nonlabored ventilation, respiratory function stable and patient connected to nasal cannula oxygen Cardiovascular status: stable and blood pressure returned to baseline Postop Assessment: no apparent nausea or vomiting Anesthetic complications: no   No complications documented.  Jasiel Belisle A  Atzel Mccambridge

## 2020-06-02 NOTE — H&P (Signed)

## 2020-06-02 NOTE — Anesthesia Procedure Notes (Signed)
Procedure Name: MAC Performed by: Sparrow Sanzo, CRNA Pre-anesthesia Checklist: Patient identified, Emergency Drugs available, Suction available, Timeout performed and Patient being monitored Patient Re-evaluated:Patient Re-evaluated prior to induction Oxygen Delivery Method: Nasal cannula Placement Confirmation: positive ETCO2       

## 2020-06-02 NOTE — Transfer of Care (Signed)
Immediate Anesthesia Transfer of Care Note  [name] [facility]  Procedure(s) Performed: CATARACT EXTRACTION PHACO AND INTRAOCULAR LENS PLACEMENT (IOC) RIGHT (Right Eye)  Patient Location: PACU  Anesthesia Type: MAC  Level of Consciousness: awake, alert  and patient cooperative  Airway and Oxygen Therapy: Patient Spontanous Breathing   Post-op Assessment: Post-op Vital signs reviewed, Patient's Cardiovascular Status Stable, Respiratory Function Stable, Patent Airway and No signs of Nausea or vomiting  Post-op Vital Signs: Reviewed and stable  Complications: No complications documented.

## 2020-06-03 ENCOUNTER — Encounter: Payer: Self-pay | Admitting: Ophthalmology

## 2020-06-19 ENCOUNTER — Other Ambulatory Visit: Payer: Self-pay | Admitting: Family Medicine

## 2020-06-19 DIAGNOSIS — R42 Dizziness and giddiness: Secondary | ICD-10-CM

## 2020-06-19 NOTE — Telephone Encounter (Signed)
Requested medication (s) are due for refill today: yes  Requested medication (s) are on the active medication list: yes  Last refill:  03/22/2018  Future visit scheduled: yes  Notes to clinic:  med not delegated to NT to RF   Requested Prescriptions  Pending Prescriptions Disp Refills   meclizine (ANTIVERT) 25 MG tablet [Pharmacy Med Name: MECLIZINE HCL 25 MG TAB] 60 tablet 2    Sig: TAKE 1 TABLET BY MOUTH TWICE DAILY AS NEEDED FOR DIZZINESS      Not Delegated - Gastroenterology: Antiemetics Failed - 06/19/2020  1:22 PM      Failed - This refill cannot be delegated      Passed - Valid encounter within last 6 months    Recent Outpatient Visits           2 months ago Fatigue, unspecified type   [facility] Jerrol Banana., MD   7 months ago Encounter for subsequent annual wellness visit (AWV) in Medicare patient   [facility] Jerrol Banana., MD   8 months ago Fall, initial encounter   [facility] Jerrol Banana., MD   10 months ago Chronic diarrhea   [facility] Jerrol Banana., MD   1 year ago Chronic diarrhea   [facility] Jerrol Banana., MD       Future Appointments             In 5 months Jerrol Banana., MD [facility] [place]

## 2020-06-21 NOTE — Telephone Encounter (Signed)
Please advise if it is okay to refill?

## 2020-06-21 NOTE — Progress Notes (Signed)
I,[name],acting as a scribe for Wilhemena Durie, MD.,have documented all relevant documentation on the behalf of Wilhemena Durie, MD,as directed by  Wilhemena Durie, MD while in the presence of Wilhemena Durie, MD.   Established patient visit   [name]   DOB: 01-19-1946   73 y.o. Female  MRN: [id number] Visit Date: 06/22/2020  Today's healthcare provider: Wilhemena Durie, MD   Chief Complaint  Patient presents with  . Dysuria   Subjective    Dysuria  This is a new problem. The current episode started in the past 7 days (3 days). The pain is moderate. There has been no fever. Associated symptoms include flank pain, hesitancy and nausea. Pertinent negatives include no chills, discharge, frequency, hematuria, possible pregnancy, sweats, urgency or vomiting. She has tried increased fluids for the symptoms. The treatment provided no relief.    Patient has had right sided low back pain for 3 days. Patient also has symptoms of abdominal pain, decreased urination, flank pain and nausea. Patient has been treating symptoms with increased fluids.  She also c/o some chronic intermittant vertigo.     Medications: Outpatient Medications Prior to Visit  Medication Sig  . albuterol (VENTOLIN HFA) 108 (90 Base) MCG/ACT inhaler Inhale 2 puffs into the lungs as needed for wheezing or shortness of breath.  . diazepam (VALIUM) 2 MG tablet Take 1 tablet (2 mg total) by mouth every 12 (twelve) hours as needed for anxiety. (Patient taking differently: Take 2 mg by mouth every 8 (eight) hours as needed for anxiety. )  . famotidine (PEPCID) 20 MG tablet Take 1 tablet (20 mg total) by mouth 2 (two) times daily.  . hydrALAZINE (APRESOLINE) 50 MG tablet Take 1 tablet (50 mg total) by mouth 3 (three) times daily. (Patient taking differently: Take 50 mg by mouth daily. )  . PARoxetine (PAXIL) 20 MG tablet TAKE 1 TABLET BY MOUTH DAILY  . rosuvastatin (CRESTOR) 10 MG tablet  TAKE ONE TABLET EVERY DAY  . triamterene-hydrochlorothiazide (DYAZIDE) 37.5-25 MG capsule Take 1 each (1 capsule total) by mouth daily.  . [DISCONTINUED] meclizine (ANTIVERT) 25 MG tablet Take 1 tablet (25 mg total) by mouth 2 (two) times daily as needed for dizziness. (Patient taking differently: Take 25 mg by mouth as needed for dizziness. )  . cimetidine (TAGAMET) 400 MG tablet Take 1 tablet (400 mg total) by mouth 2 (two) times daily. (Patient not taking: Reported on 11/13/2019)  . metoprolol succinate (TOPROL-XL) 25 MG 24 hr tablet Take 1 tablet (25 mg total) by mouth daily. (Patient not taking: Reported on 04/29/2020)  . Multiple Vitamin (MULTIVITAMIN) tablet Take 1 tablet by mouth daily. (Patient not taking: Reported on 04/29/2020)   No facility-administered medications prior to visit.    Review of Systems  Constitutional: Negative for appetite change, chills, fatigue and fever.  Respiratory: Negative for chest tightness and shortness of breath.   Cardiovascular: Negative for chest pain and palpitations.  Gastrointestinal: Positive for nausea. Negative for abdominal pain and vomiting.  Genitourinary: Positive for dysuria, flank pain and hesitancy. Negative for frequency, hematuria and urgency.  Neurological: Negative for dizziness and weakness.      Objective    BP 110/63 (BP Location: Left Arm, Patient Position: Sitting, Cuff Size: Normal)   Pulse 64   Temp 97.9 F (36.6 C) (Oral)   Resp 16   Ht 5\' 7"  (1.702 m)   Wt 138 lb (62.6 kg)   SpO2 94%  BMI 21.61 kg/m     Physical Exam Vitals reviewed.  Constitutional:      Appearance: Normal appearance. She is normal weight.  HENT:     Head: Normocephalic and atraumatic.     Right Ear: External ear normal.     Left Ear: External ear normal.     Mouth/Throat:     Pharynx: Oropharynx is clear.  Eyes:     General: No scleral icterus.    Conjunctiva/sclera: Conjunctivae normal.  Cardiovascular:     Rate and Rhythm: Normal  rate and regular rhythm.     Pulses: Normal pulses.     Heart sounds: Murmur heard.      Comments: 2/6 systolic murmur. Pulmonary:     Effort: Pulmonary effort is normal.     Breath sounds: Normal breath sounds.  Abdominal:     General: Bowel sounds are normal.     Palpations: Abdomen is soft. There is no mass.     Tenderness: There is no abdominal tenderness.  Musculoskeletal:        General: No swelling.     Comments: Minimal tenderness of right lower back.  Lymphadenopathy:     Cervical: No cervical adenopathy.  Skin:    General: Skin is warm and dry.  Neurological:     General: No focal deficit present.     Mental Status: She is alert and oriented to person, place, and time.     Comments: Minimal nystagmus.  Psychiatric:        Mood and Affect: Mood normal.        Behavior: Behavior normal.        Thought Content: Thought content normal.        Judgment: Judgment normal.        Results for orders placed or performed in visit on 06/22/20  POCT urinalysis dipstick  Result Value Ref Range   Color, UA Yellow    Clarity, UA Clear    Glucose, UA Negative Negative   Bilirubin, UA Negative    Ketones, UA Negative    Spec Grav, UA 1.020 1.010 - 1.025   Blood, UA Negative    pH, UA 6.0 5.0 - 8.0   Protein, UA Negative Negative   Urobilinogen, UA 0.2 0.2 or 1.0 E.U./dL   Nitrite, UA Negative    Leukocytes, UA Moderate (2+) (A) Negative   Appearance Normal    Odor Normal     Assessment & Plan     1. Acute right-sided low back pain without sciatica  - POCT urinalysis dipstick--Abnormal  2. Acute cystitis without hematuria  - CULTURE, URINE COMPREHENSIVE - doxycycline (VIBRA-TABS) 100 MG tablet; Take 1 tablet (100 mg total) by mouth 2 (two) times daily.  Dispense: 14 tablet; Refill: 1  3. Dizziness Nonfocal exam. - meclizine (ANTIVERT) 25 MG tablet; Take 1 tablet (25 mg total) by mouth 2 (two) times daily as needed for dizziness.  Dispense: 60 tablet; Refill:  2  4. Essential hypertension   5. Diverticulosis   6. Anxiety Chronic issue.   Return in about 5 months (around 11/22/2020).      I, Wilhemena Durie, MD, have reviewed all documentation for this visit. The documentation on 06/26/20 for the exam, diagnosis, procedures, and orders are all accurate and complete.    Rosely Fernandez Cranford Mon, MD  [facility] [phone] (phone) [phone] (fax)  [place]

## 2020-06-22 ENCOUNTER — Ambulatory Visit (INDEPENDENT_AMBULATORY_CARE_PROVIDER_SITE_OTHER): Payer: PPO | Admitting: Family Medicine

## 2020-06-22 ENCOUNTER — Encounter: Payer: Self-pay | Admitting: Family Medicine

## 2020-06-22 ENCOUNTER — Other Ambulatory Visit: Payer: Self-pay

## 2020-06-22 VITALS — BP 110/63 | HR 64 | Temp 97.9°F | Resp 16 | Ht 67.0 in | Wt 138.0 lb

## 2020-06-22 DIAGNOSIS — F419 Anxiety disorder, unspecified: Secondary | ICD-10-CM

## 2020-06-22 DIAGNOSIS — I10 Essential (primary) hypertension: Secondary | ICD-10-CM | POA: Diagnosis not present

## 2020-06-22 DIAGNOSIS — K5790 Diverticulosis of intestine, part unspecified, without perforation or abscess without bleeding: Secondary | ICD-10-CM | POA: Diagnosis not present

## 2020-06-22 DIAGNOSIS — R42 Dizziness and giddiness: Secondary | ICD-10-CM

## 2020-06-22 DIAGNOSIS — N3000 Acute cystitis without hematuria: Secondary | ICD-10-CM

## 2020-06-22 DIAGNOSIS — M545 Low back pain, unspecified: Secondary | ICD-10-CM

## 2020-06-22 LAB — POCT URINALYSIS DIPSTICK
Appearance: NORMAL
Bilirubin, UA: NEGATIVE
Blood, UA: NEGATIVE
Glucose, UA: NEGATIVE
Ketones, UA: NEGATIVE
Nitrite, UA: NEGATIVE
Odor: NORMAL
Protein, UA: NEGATIVE
Spec Grav, UA: 1.02 (ref 1.010–1.025)
Urobilinogen, UA: 0.2 E.U./dL
pH, UA: 6 (ref 5.0–8.0)

## 2020-06-22 MED ORDER — DOXYCYCLINE HYCLATE 100 MG PO TABS: 100.0000 mg | ORAL_TABLET | Freq: Two times a day (BID) | ORAL | 1 refills | Status: DC

## 2020-06-22 MED ORDER — MECLIZINE HCL 25 MG PO TABS: 25.0000 mg | ORAL_TABLET | Freq: Two times a day (BID) | ORAL | 2 refills | Status: DC | PRN

## 2020-06-22 NOTE — Patient Instructions (Addendum)
For low back pain take Motrin twice daily.

## 2020-06-24 ENCOUNTER — Telehealth: Payer: Self-pay

## 2020-06-24 NOTE — Telephone Encounter (Signed)
-----   Message from Jerrol Banana., MD sent at 06/24/2020  7:57 AM EDT ----- Urine is normal.

## 2020-06-24 NOTE — Telephone Encounter (Signed)
Patient advised that blood pressure reading at last appointment was good.

## 2020-06-24 NOTE — Telephone Encounter (Signed)
Called to advise patient as below. No answer, LVMTCB. If patient calls back it is okay for pec to advise.

## 2020-06-24 NOTE — Telephone Encounter (Signed)
Result note read to patient, verbalizes understanding.  Pt states she has just noted her BP reading at recent visit. States that is low for her and she is concerned. On several meds. States "I don't want to fall out like I did before when my BP was low." Assured pt NT would route to practice for PCPs review.  Please advise

## 2020-06-24 NOTE — Telephone Encounter (Signed)
BP reading good.

## 2020-06-26 LAB — CULTURE, URINE COMPREHENSIVE

## 2020-06-28 ENCOUNTER — Other Ambulatory Visit: Payer: Self-pay | Admitting: Gastroenterology

## 2020-07-13 ENCOUNTER — Other Ambulatory Visit: Payer: Self-pay | Admitting: Family Medicine

## 2020-07-13 DIAGNOSIS — Z1231 Encounter for screening mammogram for malignant neoplasm of breast: Secondary | ICD-10-CM

## 2020-08-02 ENCOUNTER — Other Ambulatory Visit: Payer: Self-pay

## 2020-08-02 ENCOUNTER — Ambulatory Visit: Payer: PPO | Attending: Internal Medicine

## 2020-08-02 ENCOUNTER — Ambulatory Visit
Admission: RE | Admit: 2020-08-02 | Discharge: 2020-08-02 | Disposition: A | Discharge status: Home / Self Care | Payer: PPO | Source: Ambulatory Visit | Attending: Family Medicine | Admitting: Family Medicine

## 2020-08-02 DIAGNOSIS — Z23 Encounter for immunization: Secondary | ICD-10-CM

## 2020-08-02 DIAGNOSIS — Z1231 Encounter for screening mammogram for malignant neoplasm of breast: Secondary | ICD-10-CM | POA: Diagnosis not present

## 2020-08-02 NOTE — Progress Notes (Signed)
   Covid-19 Vaccination Clinic  Name:  LAVONYA HOERNER    MRN: [id number] DOB: 1946-01-13  08/02/2020  Ms. Marlowe was observed post Covid-19 immunization for 15 minutes without incident. She was provided with Vaccine Information Sheet and instruction to access the V-Safe system.   Ms. Manges was instructed to call 911 with any severe reactions post vaccine: Marland Kitchen Difficulty breathing  . Swelling of face and throat  . A fast heartbeat  . A bad rash all over body  . Dizziness and weakness

## 2020-08-26 ENCOUNTER — Telehealth: Payer: Self-pay

## 2020-08-26 NOTE — Telephone Encounter (Signed)
Copied from [place] [id number]. Topic: Appointment Scheduling - Scheduling Inquiry for Clinic >> Aug 26, 2020  1:02 PM Yvette Rack wrote: Reason for CRM: Pt requested to schedule an appt with Dr. Rosanna Randy for possible uti but there are no available appts. Pt only wanted to see Dr. Rosanna Randy and requested that a message be sent to [facility] to return her call.

## 2020-08-26 NOTE — Telephone Encounter (Signed)
Apt with Simona Huh on 08/27/2020 at 1:20pm  Thanks,   -Mickel Baas

## 2020-08-27 ENCOUNTER — Ambulatory Visit
Admission: RE | Admit: 2020-08-27 | Discharge: 2020-08-27 | Disposition: A | Discharge status: Home / Self Care | Payer: PPO | Attending: Family Medicine | Admitting: Family Medicine

## 2020-08-27 ENCOUNTER — Encounter: Payer: Self-pay | Admitting: Family Medicine

## 2020-08-27 ENCOUNTER — Ambulatory Visit (INDEPENDENT_AMBULATORY_CARE_PROVIDER_SITE_OTHER): Payer: PPO | Admitting: Family Medicine

## 2020-08-27 ENCOUNTER — Other Ambulatory Visit: Payer: Self-pay

## 2020-08-27 ENCOUNTER — Ambulatory Visit
Admission: RE | Admit: 2020-08-27 | Discharge: 2020-08-27 | Disposition: A | Discharge status: Home / Self Care | Payer: PPO | Source: Ambulatory Visit | Attending: Family Medicine | Admitting: Family Medicine

## 2020-08-27 VITALS — BP 133/63 | HR 69 | Temp 98.1°F | Resp 16 | Ht 67.0 in | Wt 137.0 lb

## 2020-08-27 DIAGNOSIS — R5383 Other fatigue: Secondary | ICD-10-CM | POA: Diagnosis not present

## 2020-08-27 DIAGNOSIS — M545 Low back pain, unspecified: Secondary | ICD-10-CM | POA: Insufficient documentation

## 2020-08-27 DIAGNOSIS — I5032 Chronic diastolic (congestive) heart failure: Secondary | ICD-10-CM

## 2020-08-27 LAB — POCT URINALYSIS DIPSTICK
Appearance: NORMAL
Bilirubin, UA: NEGATIVE
Blood, UA: NEGATIVE
Glucose, UA: NEGATIVE
Ketones, UA: NEGATIVE
Leukocytes, UA: NEGATIVE
Nitrite, UA: NEGATIVE
Odor: NORMAL
Protein, UA: NEGATIVE
Spec Grav, UA: 1.02 (ref 1.010–1.025)
Urobilinogen, UA: 0.2 E.U./dL
pH, UA: 6.5 (ref 5.0–8.0)

## 2020-08-27 MED ORDER — BACLOFEN 10 MG PO TABS: 10.0000 mg | ORAL_TABLET | Freq: Three times a day (TID) | ORAL | 0 refills | Status: DC

## 2020-08-27 NOTE — Progress Notes (Signed)
I,Jessica Armstrong,acting as a Education administrator for [employer], [employer].,have documented all relevant documentation on the behalf of [employer],as directed by  [employer] while in the presence of [employer], [place].   Established patient visit   [name]   DOB: Jul 14, 1946   74 y.o. Female  MRN: [id number] Visit Date: 08/27/2020  Today's healthcare provider: Vernie Murders, PA   Chief Complaint  Patient presents with  . Back Pain   Subjective    Back Pain This is a new problem. The current episode started in the past 7 days (3-5 days). The problem occurs constantly. The pain is present in the lumbar spine. The quality of the pain is described as cramping and stabbing. The pain does not radiate. The pain is at a severity of 5/10. The pain is moderate. The pain is the same all the time. The symptoms are aggravated by standing, twisting, sitting, lying down, position and bending. Associated symptoms include leg pain, numbness and tingling. Pertinent negatives include no abdominal pain, bladder incontinence, bowel incontinence, chest pain, dysuria, fever, headaches, paresis, paresthesias, pelvic pain, perianal numbness, weakness or weight loss. She has tried heat and bed rest for the symptoms. The treatment provided mild relief.    Patient states she has had low right sided back pain for the past several days. Patient states she is having numbness and tingling in right leg. Also having cramps in her right foot. Patient has been using heat and rest for pain.   Past Medical History:  Diagnosis Date  . Allergies   . Anemia    mild; no medical treatment  . Arthritis   . Carotid artery stenosis    a. doppler 03/3874: RICA 64-33%, LICA 2-95% f/u 1 year  . Cervical spondyloarthritis   . Chronic diastolic CHF (congestive heart failure) ([place])    a. echo 04/2015: EF 55-60%, no RWMA, mitral valve w/ systolic bowing w/o prolapse, PASP 53 mmHg  . Claudication ([place])   . Colon  polyp   . Cystitis   . Depression    situational  . Diverticulosis   . Dizziness   . Dyspnea   . Dysrhythmia    bradycardia  . GERD (gastroesophageal reflux disease)   . H/O vertigo   . Heart murmur   . History of measles   . History of mumps   . Hypercholesterolemia   . Hyperglycemia   . Hypertension    vs hypotension  . IBS (irritable bowel syndrome)   . Need for SBE (subacute bacterial endocarditis) prophylaxis   . Neuropathy   . Palpitations   . PONV (postoperative nausea and vomiting)    severe n/v after anesthesia  . Post-menopausal   . Pulmonary valve dysplasia   . Rotator cuff tear, right   . Seasonal allergies   . TMJ (dislocation of temporomandibular joint)   . TMJ (temporomandibular joint syndrome)   . Tricuspid regurgitation    a. echo 04/2013: EF 55-60%, nl wall motion, mild to mod TR, PASP 35 mm Hg   Past Surgical History:  Procedure Laterality Date  . ABDOMINAL HYSTERECTOMY    . ANTERIOR CERVICAL DECOMP/DISCECTOMY FUSION N/A 08/26/2013   Procedure: CERVICAL SIX TO SEVEN ANTERIOR CERVICAL DECOMPRESSION/DISCECTOMY FUSION 1 LEVEL;  Surgeon: Erline Levine, MD;  Location: [place] NEURO ORS;  Service: Neurosurgery;  Laterality: N/A;  C6-7 Anterior cervical decompression/diskectomy/fusion  . BREAST BIOPSY Right    neg  . CATARACT EXTRACTION W/PHACO Left 05/05/2020   Procedure: CATARACT EXTRACTION  PHACO AND INTRAOCULAR LENS PLACEMENT (IOC) LEFT;  Surgeon: Leandrew Koyanagi, MD;  Location: [place];  Service: Ophthalmology;  Laterality: Left;  3.80 0:43.4 8.8%  . CATARACT EXTRACTION W/PHACO Right 06/02/2020   Procedure: CATARACT EXTRACTION PHACO AND INTRAOCULAR LENS PLACEMENT ([place]) RIGHT;  Surgeon: Leandrew Koyanagi, MD;  Location: [place];  Service: Ophthalmology;  Laterality: Right;  4.97 0:57.8 8.6%  . CHOLECYSTECTOMY    . COLONOSCOPY WITH PROPOFOL N/A 01/14/2016   Procedure: COLONOSCOPY WITH PROPOFOL;  Surgeon: Manya Silvas, MD;   Location: [facility] ENDOSCOPY;  Service: Endoscopy;  Laterality: N/A;  . COLONOSCOPY WITH PROPOFOL N/A 11/04/2018   Procedure: COLONOSCOPY WITH PROPOFOL;  Surgeon: Manya Silvas, MD;  Location[facility]COPY;  Service: Endoscopy;  Laterality: N/A;  . COLONOSCOPY WITH PROPOFOL N/A 07/09/2019   Procedure: COLONOSCOPY WITH PROPOFOL;  Surgeon: Tol[place], Benay Pike, MD;  Location: [facility] ENDOSCOPY;  Service: Gastroenterology;  Laterality: N/A;  . ESOPHAGOGASTRODUODENOSCOPY (EGD) WITH PROPOFOL N/A 01/14/2016   Procedure: ESOPHAGOGASTRODUODENOSCOPY (EGD) WITH PROPOFOL;  Surgeon: Manya Silvas, MD;  Location: [facility] ENDOSCOPY;  Service: Endoscopy;  Laterality: N/A;  . ESOPHAGOGASTRODUODENOSCOPY (EGD) WITH PROPOFOL N/A 11/04/2018   Procedure: ESOPHAGOGASTRODUODENOSCOPY (EGD) WITH PROPOFOL;  Surgeon: Manya Silvas, MD;  Location: [facility] ENDOSCOPY;  Service: Endoscopy;  Laterality: N/A;  . EYE SURGERY     benign tumor of eye  . SHOULDER ARTHROSCOPY WITH ROTATOR CUFF REPAIR AND SUBACROMIAL DECOMPRESSION Right 05/31/2017   Procedure: SHOULDER ARTHROSCOPY WITH SUBACROMIAL DECOMPRESSION AND DISTAL CLAVICAL EXCISION;  Surgeon: Tania Ade, MD;  Location: [place];  Service: Orthopedics;  Laterality: Right;  SHOULDER ARTHROSCOPY WITH SUBACROMIAL DECOMPRESSION AND DISTAL CLAVICAL EXCISION   Social History   Tobacco Use  . Smoking status: Never Smoker  . Smokeless tobacco: Never Used  . 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 Vaping Use: Never used  Substance Use Topics  . Alcohol use: Yes    Comment: rare use 1-2 drinks per year.   . Drug use: No   Family History  Problem Relation Age of Onset  . Heart disease Mother   . Hypertension Mother   . COPD Mother        was a smoker  . Hypertension Brother   . Colon cancer Father   . Asthma Maternal Grandfather   . Breast cancer Neg Hx    Allergies  Allergen Reactions  . Cholestyramine Nausea And Vomiting  .  Ciprofloxacin Nausea And Vomiting  . Codeine Sulfate Nausea And Vomiting  . Keflex [Cephalexin] Nausea And Vomiting  . Macrobid [Nitrofurantoin Macrocrystal] Nausea And Vomiting  . Sulfa Antibiotics Nausea And Vomiting and Rash       Medications: Outpatient Medications Prior to Visit  Medication Sig  . albuterol (VENTOLIN HFA) 108 (90 Base) MCG/ACT inhaler Inhale 2 puffs into the lungs as needed for wheezing or shortness of breath.  . diazepam (VALIUM) 2 MG tablet Take 1 tablet (2 mg total) by mouth every 12 (twelve) hours as needed for anxiety. (Patient taking differently: Take 2 mg by mouth every 8 (eight) hours as needed for anxiety. )  . famotidine (PEPCID) 20 MG tablet TAKE ONE TABLET BY MOUTH TWICE DAILY  . hydrALAZINE (APRESOLINE) 50 MG tablet Take 1 tablet (50 mg total) by mouth 3 (three) times daily. (Patient taking differently: Take 50 mg by mouth daily. )  . meclizine (ANTIVERT) 25 MG tablet Take 1 tablet (25 mg total) by mouth 2 (two) times daily  as needed for dizziness.  Marland Kitchen PARoxetine (PAXIL) 20 MG tablet TAKE 1 TABLET BY MOUTH DAILY  . rosuvastatin (CRESTOR) 10 MG tablet TAKE ONE TABLET EVERY DAY  . triamterene-hydrochlorothiazide (DYAZIDE) 37.5-25 MG capsule Take 1 each (1 capsule total) by mouth daily.  . cimetidine (TAGAMET) 400 MG tablet Take 1 tablet (400 mg total) by mouth 2 (two) times daily. (Patient not taking: Reported on 11/13/2019)  . doxycycline (VIBRA-TABS) 100 MG tablet Take 1 tablet (100 mg total) by mouth 2 (two) times daily. (Patient not taking: Reported on 08/27/2020)  . meclizine (ANTIVERT) 25 MG tablet TAKE 1 TABLET BY MOUTH TWICE DAILY AS NEEDED FOR DIZZINESS (Patient not taking: Reported on 08/27/2020)  . metoprolol succinate (TOPROL-XL) 25 MG 24 hr tablet Take 1 tablet (25 mg total) by mouth daily. (Patient not taking: Reported on 04/29/2020)  . Multiple Vitamin (MULTIVITAMIN) tablet Take 1 tablet by mouth daily. (Patient not taking: Reported on 04/29/2020)    No facility-administered medications prior to visit.    Review of Systems  Constitutional: Negative for appetite change, chills, fatigue, fever and weight loss.  Respiratory: Negative for chest tightness and shortness of breath.   Cardiovascular: Negative for chest pain and palpitations.  Gastrointestinal: Negative for abdominal pain, bowel incontinence, nausea and vomiting.  Genitourinary: Negative for bladder incontinence, dysuria and pelvic pain.  Musculoskeletal: Positive for back pain.  Neurological: Positive for tingling and numbness. Negative for dizziness, weakness, headaches and paresthesias.      Objective    BP 133/63 (BP Location: Right Arm, Patient Position: Sitting, Cuff Size: Normal)   Pulse 69   Temp 98.1 F (36.7 C) (Oral)   Resp 16   Ht 5\' 7"  (1.702 m)   Wt 137 lb (62.1 kg)   SpO2 99%   BMI 21.46 kg/m    Physical Exam Constitutional:      General: She is not in acute distress.    Appearance: She is well-developed.  HENT:     Head: Normocephalic and atraumatic.     Right Ear: Hearing normal.     Left Ear: Hearing normal.     Nose: Nose normal.  Eyes:     General: Lids are normal. No scleral icterus.       Right eye: No discharge.        Left eye: No discharge.     Conjunctiva/sclera: Conjunctivae normal.  Cardiovascular:     Rate and Rhythm: Normal rate and regular rhythm.     Pulses: Normal pulses.     Heart sounds: Normal heart sounds.  Pulmonary:     Effort: Pulmonary effort is normal. No respiratory distress.     Breath sounds: Normal breath sounds.  Abdominal:     General: Bowel sounds are normal.     Palpations: Abdomen is soft.  Musculoskeletal:        General: Normal range of motion.     Cervical back: Neck supple.  Skin:    Findings: No lesion or rash.  Neurological:     Mental Status: She is alert and oriented to person, place, and time.  Psychiatric:        Speech: Speech normal.        Behavior: Behavior normal.         Thought Content: Thought content normal.       No results found for any visits on 08/27/20.  Assessment & Plan     1. Acute right-sided low back pain without sciatica Low back pain the past  3-5 days without a specific injury. No radiation down legs. Some tenderness to palpation. Will treat with Baclofen for spasms and apply moist heat. Check labs and urinalysis for signs of infection. - POCT urinalysis dipstick - CBC with Differential/Platelet - DG Lumbar Spine Complete - baclofen (LIORESAL) 10 MG tablet; Take 1 tablet (10 mg total) by mouth 3 (three) times daily.  Dispense: 30 each; Refill: 0  2. Fatigue, unspecified type History of fatigue for several weeks. Will check CBC, CMP and TSH to rule out anemia, electrolyte imbalance, blood sugar problems or thyroid disease. - CBC with Differential/Platelet - Comprehensive metabolic panel - TSH  3. Chronic diastolic CHF (congestive heart failure) (HCC) Followed by Dr. Saralyn Pilar (cardiologist). Check labs. - CBC with Differential/Platelet - Comprehensive metabolic panel   No follow-ups on file.      Andres Shad, PA, have reviewed all documentation for this visit. The documentation on 08/27/20 for the exam, diagnosis, procedures, and orders are all accurate and complete.    Vernie Murders, [place] [phone] (phone) [phone] (fax)  [place]

## 2020-08-28 LAB — CBC WITH DIFFERENTIAL/PLATELET
Basophils Absolute: 0.1 x10E3/uL (ref 0.0–0.2)
Basos: 1 %
EOS (ABSOLUTE): 0.3 x10E3/uL (ref 0.0–0.4)
Eos: 4 %
Hematocrit: 35.5 % (ref 34.0–46.6)
Hemoglobin: 12.2 g/dL (ref 11.1–15.9)
Immature Grans (Abs): 0 x10E3/uL (ref 0.0–0.1)
Immature Granulocytes: 0 %
Lymphocytes Absolute: 1.7 x10E3/uL (ref 0.7–3.1)
Lymphs: 23 %
MCH: 31.5 pg (ref 26.6–33.0)
MCHC: 34.4 g/dL (ref 31.5–35.7)
MCV: 92 fL (ref 79–97)
Monocytes Absolute: 1 x10E3/uL — ABNORMAL HIGH (ref 0.1–0.9)
Monocytes: 15 %
Neutrophils Absolute: 4.1 x10E3/uL (ref 1.4–7.0)
Neutrophils: 57 %
Platelets: 273 x10E3/uL (ref 150–450)
RBC: 3.87 x10E6/uL (ref 3.77–5.28)
RDW: 11.9 % (ref 11.7–15.4)
WBC: 7.2 x10E3/uL (ref 3.4–10.8)

## 2020-08-28 LAB — COMPREHENSIVE METABOLIC PANEL WITH GFR
ALT: 12 IU/L (ref 0–32)
AST: 19 IU/L (ref 0–40)
Albumin/Globulin Ratio: 1.7 (ref 1.2–2.2)
Albumin: 4.5 g/dL (ref 3.7–4.7)
Alkaline Phosphatase: 88 IU/L (ref 44–121)
BUN/Creatinine Ratio: 24 (ref 12–28)
BUN: 23 mg/dL (ref 8–27)
Bilirubin Total: 0.3 mg/dL (ref 0.0–1.2)
CO2: 23 mmol/L (ref 20–29)
Calcium: 9.8 mg/dL (ref 8.7–10.3)
Chloride: 104 mmol/L (ref 96–106)
Creatinine, Ser: 0.97 mg/dL (ref 0.57–1.00)
GFR calc Af Amer: 66 mL/min/1.73
GFR calc non Af Amer: 57 mL/min/1.73 — ABNORMAL LOW
Globulin, Total: 2.7 g/dL (ref 1.5–4.5)
Glucose: 81 mg/dL (ref 65–99)
Potassium: 5.1 mmol/L (ref 3.5–5.2)
Sodium: 142 mmol/L (ref 134–144)
Total Protein: 7.2 g/dL (ref 6.0–8.5)

## 2020-08-28 LAB — TSH: TSH: 2.27 u[IU]/mL (ref 0.450–4.500)

## 2020-08-30 ENCOUNTER — Telehealth: Payer: Self-pay | Admitting: Family Medicine

## 2020-08-30 ENCOUNTER — Telehealth: Payer: Self-pay

## 2020-08-30 DIAGNOSIS — I10 Essential (primary) hypertension: Secondary | ICD-10-CM | POA: Diagnosis not present

## 2020-08-30 DIAGNOSIS — I5032 Chronic diastolic (congestive) heart failure: Secondary | ICD-10-CM | POA: Diagnosis not present

## 2020-08-30 DIAGNOSIS — R55 Syncope and collapse: Secondary | ICD-10-CM | POA: Diagnosis not present

## 2020-08-30 DIAGNOSIS — Z23 Encounter for immunization: Secondary | ICD-10-CM | POA: Diagnosis not present

## 2020-08-30 NOTE — Telephone Encounter (Signed)
Patient advised of results.  She states she is unable to take Prednisone.  She states that the Baclofen is not really helping.   She also reports that she is having numbness down both legs and into her feet.  She is having to stay on the heating pad and is unable to get up.  Is there anything else you can give her? Does she need a referral?

## 2020-08-30 NOTE — Telephone Encounter (Signed)
Patient recently seen by Simona Huh on 08/27/2020. Patient calling for results.

## 2020-08-30 NOTE — Telephone Encounter (Signed)
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 Labs essentially normal. May need a prednisone taper to try to get back discomfort/inflammation calmed down if no improvement with the Baclofen muscle relaxer.

## 2020-08-30 NOTE — Telephone Encounter (Signed)
Patient called to ask if the nurse could call her to go over her x-ray results she read on My Chart.  She stated she has some questions.  CB# [phone]

## 2020-08-31 ENCOUNTER — Other Ambulatory Visit: Payer: Self-pay | Admitting: Family Medicine

## 2020-08-31 DIAGNOSIS — M5441 Lumbago with sciatica, right side: Secondary | ICD-10-CM

## 2020-08-31 MED ORDER — METHYLPREDNISOLONE 4 MG PO TBPK: ORAL_TABLET | ORAL | 0 refills | Status: DC

## 2020-08-31 NOTE — Progress Notes (Signed)
Right low back pain persists and now having some numbness and radiation down the right leg. Will give Medrol Dosepak for 6 days to use with Baclofen. May need referral to spine specialist if no better in 5-6 days. Continue moist heat applications and rest.

## 2020-09-19 ENCOUNTER — Other Ambulatory Visit: Payer: Self-pay | Admitting: Family Medicine

## 2020-09-19 NOTE — Telephone Encounter (Signed)
Requested Prescriptions  Pending Prescriptions Disp Refills  . triamterene-hydrochlorothiazide (DYAZIDE) 37.5-25 MG capsule [Pharmacy Med Name: TRIAMTERENE-HCTZ 37.5-25 MG CAP] 90 capsule 0    Sig: TAKE 1 CAPSULE BY MOUTH EVERY DAY     Cardiovascular: Diuretic Combos Passed - 09/19/2020  1:33 PM      Passed - K in normal range and within 360 days    Potassium  Date Value Ref Range Status  08/27/2020 5.1 3.5 - 5.2 mmol/L Final         Passed - Na in normal range and within 360 days    Sodium  Date Value Ref Range Status  08/27/2020 142 134 - 144 mmol/L Final         Passed - Cr in normal range and within 360 days    Creatinine, Ser  Date Value Ref Range Status  08/27/2020 0.97 0.57 - 1.00 mg/dL Final         Passed - Ca in normal range and within 360 days    Calcium  Date Value Ref Range Status  08/27/2020 9.8 8.7 - 10.3 mg/dL Final         Passed - Last BP in normal range    BP Readings from Last 1 Encounters:  08/27/20 133/63         Passed - Valid encounter within last 6 months    Recent Outpatient Visits          3 weeks ago Acute right-sided low back pain without sciatica   [place], Vickki Muff, PA-C   2 months ago Acute right-sided low back pain without sciatica   [facility] Jerrol Banana., MD   5 months ago Fatigue, unspecified type   [facility] Jerrol Banana., MD   10 months ago Encounter for subsequent annual wellness visit (AWV) in Medicare patient   [facility] Jerrol Banana., MD   11 months ago Fall, initial encounter   [facility] Jerrol Banana., MD      Future Appointments            In 1 month Jerrol Banana., MD [facility]

## 2020-10-05 DIAGNOSIS — M5416 Radiculopathy, lumbar region: Secondary | ICD-10-CM | POA: Diagnosis not present

## 2020-10-05 DIAGNOSIS — I10 Essential (primary) hypertension: Secondary | ICD-10-CM | POA: Diagnosis not present

## 2020-10-07 ENCOUNTER — Other Ambulatory Visit: Payer: Self-pay | Admitting: Neurosurgery

## 2020-10-07 ENCOUNTER — Other Ambulatory Visit (HOSPITAL_COMMUNITY): Payer: Self-pay | Admitting: Neurosurgery

## 2020-10-07 DIAGNOSIS — M5416 Radiculopathy, lumbar region: Secondary | ICD-10-CM

## 2020-10-16 ENCOUNTER — Ambulatory Visit
Admission: RE | Admit: 2020-10-16 | Discharge: 2020-10-16 | Disposition: A | Discharge status: Home / Self Care | Payer: PPO | Source: Ambulatory Visit | Attending: Neurosurgery | Admitting: Neurosurgery

## 2020-10-16 ENCOUNTER — Other Ambulatory Visit: Payer: Self-pay

## 2020-10-16 DIAGNOSIS — M5416 Radiculopathy, lumbar region: Secondary | ICD-10-CM | POA: Diagnosis not present

## 2020-10-16 DIAGNOSIS — M545 Low back pain, unspecified: Secondary | ICD-10-CM | POA: Diagnosis not present

## 2020-10-19 ENCOUNTER — Ambulatory Visit: Payer: PPO

## 2020-10-26 DIAGNOSIS — I10 Essential (primary) hypertension: Secondary | ICD-10-CM | POA: Diagnosis not present

## 2020-10-28 ENCOUNTER — Telehealth: Payer: Self-pay | Admitting: *Deleted

## 2020-10-28 NOTE — Chronic Care Management (AMB) (Signed)
  Chronic Care Management   Note  10/28/2020 Name: Jessica Armstrong MRN: [id number] DOB: 07-31-1946  Jessica Armstrong is a 75 y.o. year old female who is a primary care patient of Jerrol Banana., MD. I reached out to Manya Silvas by phone today in response to a referral sent by Ms. Jillyn Ledger [facility] health plan.     Ms. Frier was given information about Chronic Care Management services today including:  1. CCM service includes personalized support from designated clinical staff supervised by her physician, including individualized plan of care and coordination with other care providers 2. 24/7 contact phone numbers for assistance for urgent and routine care needs. 3. Service will only be billed when office clinical staff spend 20 minutes or more in a month to coordinate care. 4. Only one practitioner may furnish and bill the service in a calendar month. 5. The patient may stop CCM services at any time (effective at the end of the month) by phone call to the office staff. 6. The patient will be responsible for cost sharing (co-pay) of up to 20% of the service fee (after annual deductible is met).  Patient did not agree to enrollment in care management services and does not wish to consider at this time.  Follow up plan: The care management team is available to follow up with the patient after provider conversation with the patient regarding recommendation for care management engagement and subsequent re-referral to the care management team.   [place] [facility]

## 2020-11-09 ENCOUNTER — Telehealth: Payer: Self-pay | Admitting: Family Medicine

## 2020-11-09 NOTE — Telephone Encounter (Signed)
Patient declined the Medicare Wellness Visit with [facility]. Stated Dr Rosanna Randy will do the AWVS for her

## 2020-11-15 DIAGNOSIS — M5416 Radiculopathy, lumbar region: Secondary | ICD-10-CM | POA: Diagnosis not present

## 2020-11-15 NOTE — Progress Notes (Signed)
Annual Wellness Visit     [name], Female    DOB: [birth year], 75 y.o.   MRN: [id number] Visit Date: 11/16/2020  Today's Provider: Wilhemena Durie, MD   Chief Complaint  Patient presents with  . Annual Exam  . Back Pain   Subjective    Jessica Armstrong is a 75 y.o. female who presents today for her Annual Wellness Visit.Annual Physical Exam. She reports consuming a general diet. The patient does not participate in regular exercise at present. She generally feels fairly well. She reports sleeping fairly well. She does not have additional problems to discuss today.   She has chronic back pain with neuropathy pain right greater than left leg.  She has pain numbness and tingling. She did have some back injections yesterday.      Medications: Outpatient Medications Prior to Visit  Medication Sig  . albuterol (VENTOLIN HFA) 108 (90 Base) MCG/ACT inhaler Inhale 2 puffs into the lungs as needed for wheezing or shortness of breath.  . diazepam (VALIUM) 2 MG tablet Take 1 tablet (2 mg total) by mouth every 12 (twelve) hours as needed for anxiety. (Patient taking differently: Take 2 mg by mouth every 8 (eight) hours as needed for anxiety.)  . famotidine (PEPCID) 20 MG tablet TAKE ONE TABLET BY MOUTH TWICE DAILY  . meclizine (ANTIVERT) 25 MG tablet Take 1 tablet (25 mg total) by mouth 2 (two) times daily as needed for dizziness.  Marland Kitchen PARoxetine (PAXIL) 20 MG tablet TAKE 1 TABLET BY MOUTH DAILY  . rosuvastatin (CRESTOR) 10 MG tablet TAKE ONE TABLET EVERY DAY  . triamterene-hydrochlorothiazide (DYAZIDE) 37.5-25 MG capsule TAKE 1 CAPSULE BY MOUTH EVERY DAY  . baclofen (LIORESAL) 10 MG tablet Take 1 tablet (10 mg total) by mouth 3 (three) times daily. (Patient not taking: Reported on 11/16/2020)  . methylPREDNISolone (MEDROL DOSEPAK) 4 MG TBPK tablet Taper down by 1 tablet by mouth daily starting at 6 day 1, then, 5 day 2, 4 day 3, 3 day 4, 2 day 5 and 1 day 6. Divide daily dosage  among meals and bedtime.   No facility-administered medications prior to visit.    Allergies  Allergen Reactions  . Cholestyramine Nausea And Vomiting  . Ciprofloxacin Nausea And Vomiting  . Codeine Sulfate Nausea And Vomiting  . Keflex [Cephalexin] Nausea And Vomiting  . Macrobid [Nitrofurantoin Macrocrystal] Nausea And Vomiting  . Sulfa Antibiotics Nausea And Vomiting and Rash    Patient Care Team: Jerrol Banana., MD as PCP - General (Family Medicine)  Review of Systems  All other systems reviewed and are negative.      Objective    Vitals: BP (!) 141/60   Pulse 69   Temp 98.3 F (36.8 C)   Resp 16   Wt 136 lb (61.7 kg)   BMI 21.30 kg/m  BP Readings from Last 3 Encounters:  11/16/20 (!) 141/60  08/27/20 133/63  06/22/20 110/63   Wt Readings from Last 3 Encounters:  11/16/20 136 lb (61.7 kg)  08/27/20 137 lb (62.1 kg)  06/22/20 138 lb (62.6 kg)      Physical Exam Vitals reviewed.  Constitutional:      Appearance: She is well-developed.  HENT:     Head: Normocephalic and atraumatic.     Right Ear: External ear normal.     Left Ear: External ear normal.  Eyes:     Conjunctiva/sclera: Conjunctivae normal.     Pupils: Pupils are equal,  round, and reactive to light.  Cardiovascular:     Rate and Rhythm: Normal rate and regular rhythm.     Heart sounds: Normal heart sounds. No murmur heard.   Pulmonary:     Effort: Pulmonary effort is normal.     Breath sounds: Normal breath sounds.  Chest:  Breasts:     Right: Normal.     Left: Normal.    Abdominal:     Palpations: Abdomen is soft. There is no mass.     Tenderness: There is no guarding or rebound.  Genitourinary:    Vagina: Normal. No vaginal discharge.     Rectum: Guaiac result negative.  Musculoskeletal:        General: No tenderness. Normal range of motion.     Cervical back: Normal range of motion and neck supple.  Skin:    General: Skin is warm and dry.     Findings: No erythema  or rash.  Neurological:     General: No focal deficit present.     Mental Status: She is alert and oriented to person, place, and time.     Cranial Nerves: No cranial nerve deficit.     Coordination: Coordination normal.  Psychiatric:        Mood and Affect: Mood normal.        Behavior: Behavior normal.        Thought Content: Thought content normal.        Judgment: Judgment normal.      Most recent functional status assessment: In your present state of health, do you have any difficulty performing the following activities: 11/16/2020  Hearing? N  Vision? N  Difficulty concentrating or making decisions? N  Walking or climbing stairs? Y  Dressing or bathing? N  Doing errands, shopping? N  Some recent data might be hidden   Most recent fall risk assessment: Fall Risk  11/16/2020  Falls in the past year? 0  Comment -  Number falls in past yr: 0  Comment -  Injury with Fall? 0  Risk for fall due to : No Fall Risks  Follow up Falls evaluation completed    Most recent depression screenings: PHQ 2/9 Scores 11/16/2020 06/22/2020  PHQ - 2 Score 1 0  PHQ- 9 Score 3 7   Most recent cognitive screening: 6CIT Screen 11/16/2020  What Year? 0 points  What month? 0 points  What time? 0 points  Count back from 20 0 points  Months in reverse 0 points  Repeat phrase 2 points  Total Score 2   Most recent Audit-C alcohol use screening Alcohol Use Disorder Test (AUDIT) 11/16/2020  1. How often do you have a drink containing alcohol? 1  2. How many drinks containing alcohol do you have on a typical day when you are drinking? 0  3. How often do you have six or more drinks on one occasion? 0  AUDIT-C Score 1  Alcohol Brief Interventions/Follow-up AUDIT Score <7 follow-up not indicated   A score of 3 or more in women, and 4 or more in men indicates increased risk for alcohol abuse, EXCEPT if all of the points are from question 1   No results found for any visits on 11/16/20.  Assessment &  Plan     Annual wellness visit done today including the all of the following: Reviewed patient's Family Medical History Reviewed and updated list of patient's medical providers Assessment of cognitive impairment was done Assessed patient's functional ability Established a written  schedule for health screening services Health Risk Assessent Completed and Reviewed  Exercise Activities and Dietary recommendations Goals   None     Immunization History  Administered Date(s) Administered  . Influenza Whole 07/09/2012  . Influenza, High Dose Seasonal PF 07/11/2016, 08/06/2017, 07/17/2018, 07/28/2019, 07/14/2020  . Influenza-Unspecified 09/10/2015, 07/17/2018  . PFIZER(Purple Top)SARS-COV-2 Vaccination 11/21/2019, 12/17/2019, 08/02/2020  . Pneumococcal Conjugate-13 12/07/2015  . Pneumococcal Polysaccharide-23 11/23/2011  . Zoster 08/06/2013    Health Maintenance  Topic Date Due  . Hepatitis C Screening  Never done  . TETANUS/TDAP  Never done  . COLONOSCOPY (Pts 45-56yrs Insurance coverage will need to be confirmed)  07/08/2024  . INFLUENZA VACCINE  Completed  . DEXA SCAN  Completed  . COVID-19 Vaccine  Completed  . PNA vac Low Risk Adult  Completed     Discussed health benefits of physical activity, and encouraged her to engage in regular exercise appropriate for her age and condition.   1. Encounter for subsequent annual wellness visit (AWV) in Medicare patient   2. Annual physical exam Advised  patient she needs a tetanus shot.  3. Essential hypertension  - Comprehensive metabolic panel - CBC with Differential/Platelet  4. Mixed hyperlipidemia  - Lipid panel - TSH  5. Blood glucose elevated  - Hemoglobin A1c  6. Neuropathy Consider alpha lipoic acid and or gabapentin  7. Chronic low back pain with bilateral sciatica, unspecified back pain laterality Per neurosurgery    No follow-ups on file.     I, Wilhemena Durie, MD, have reviewed all  documentation for this visit. The documentation on 11/21/20 for the exam, diagnosis, procedures, and orders are all accurate and complete.    Hagen Tidd Cranford Mon, MD  [facility] [phone] (phone) [phone] (fax)  [place]

## 2020-11-16 ENCOUNTER — Ambulatory Visit (INDEPENDENT_AMBULATORY_CARE_PROVIDER_SITE_OTHER): Payer: PPO | Admitting: Family Medicine

## 2020-11-16 ENCOUNTER — Other Ambulatory Visit: Payer: Self-pay

## 2020-11-16 ENCOUNTER — Encounter: Payer: Self-pay | Admitting: Family Medicine

## 2020-11-16 VITALS — BP 141/60 | HR 69 | Temp 98.3°F | Resp 16 | Wt 136.0 lb

## 2020-11-16 DIAGNOSIS — G629 Polyneuropathy, unspecified: Secondary | ICD-10-CM | POA: Diagnosis not present

## 2020-11-16 DIAGNOSIS — I10 Essential (primary) hypertension: Secondary | ICD-10-CM

## 2020-11-16 DIAGNOSIS — G8929 Other chronic pain: Secondary | ICD-10-CM

## 2020-11-16 DIAGNOSIS — M5441 Lumbago with sciatica, right side: Secondary | ICD-10-CM

## 2020-11-16 DIAGNOSIS — M5442 Lumbago with sciatica, left side: Secondary | ICD-10-CM

## 2020-11-16 DIAGNOSIS — R739 Hyperglycemia, unspecified: Secondary | ICD-10-CM | POA: Diagnosis not present

## 2020-11-16 DIAGNOSIS — Z0000 Encounter for general adult medical examination without abnormal findings: Secondary | ICD-10-CM | POA: Diagnosis not present

## 2020-11-16 DIAGNOSIS — E782 Mixed hyperlipidemia: Secondary | ICD-10-CM

## 2020-11-17 LAB — COMPREHENSIVE METABOLIC PANEL
ALT: 12 IU/L (ref 0–32)
AST: 17 IU/L (ref 0–40)
Albumin/Globulin Ratio: 1.9 (ref 1.2–2.2)
Albumin: 4.9 g/dL — ABNORMAL HIGH (ref 3.7–4.7)
Alkaline Phosphatase: 80 IU/L (ref 44–121)
BUN/Creatinine Ratio: 26 (ref 12–28)
BUN: 26 mg/dL (ref 8–27)
Bilirubin Total: 0.2 mg/dL (ref 0.0–1.2)
CO2: 17 mmol/L — ABNORMAL LOW (ref 20–29)
Calcium: 10.1 mg/dL (ref 8.7–10.3)
Chloride: 107 mmol/L — ABNORMAL HIGH (ref 96–106)
Creatinine, Ser: 1 mg/dL (ref 0.57–1.00)
GFR calc Af Amer: 63 mL/min/{1.73_m2} (ref >59)
GFR calc non Af Amer: 55 mL/min/{1.73_m2} — ABNORMAL LOW (ref >59)
Globulin, Total: 2.6 g/dL (ref 1.5–4.5)
Glucose: 124 mg/dL — ABNORMAL HIGH (ref 65–99)
Potassium: 4.6 mmol/L (ref 3.5–5.2)
Sodium: 145 mmol/L — ABNORMAL HIGH (ref 134–144)
Total Protein: 7.5 g/dL (ref 6.0–8.5)

## 2020-11-17 LAB — CBC WITH DIFFERENTIAL/PLATELET
Basophils Absolute: 0 10*3/uL (ref 0.0–0.2)
Basos: 0 %
EOS (ABSOLUTE): 0 10*3/uL (ref 0.0–0.4)
Eos: 0 %
Hematocrit: 34.2 % (ref 34.0–46.6)
Hemoglobin: 11.8 g/dL (ref 11.1–15.9)
Immature Grans (Abs): 0 10*3/uL (ref 0.0–0.1)
Immature Granulocytes: 0 %
Lymphocytes Absolute: 1 10*3/uL (ref 0.7–3.1)
Lymphs: 8 %
MCH: 31.4 pg (ref 26.6–33.0)
MCHC: 34.5 g/dL (ref 31.5–35.7)
MCV: 91 fL (ref 79–97)
Monocytes Absolute: 1.1 10*3/uL — ABNORMAL HIGH (ref 0.1–0.9)
Monocytes: 9 %
Neutrophils Absolute: 10.3 10*3/uL — ABNORMAL HIGH (ref 1.4–7.0)
Neutrophils: 83 %
Platelets: 254 10*3/uL (ref 150–450)
RBC: 3.76 x10E6/uL — ABNORMAL LOW (ref 3.77–5.28)
RDW: 12 % (ref 11.7–15.4)
WBC: 12.4 10*3/uL — ABNORMAL HIGH (ref 3.4–10.8)

## 2020-11-17 LAB — LIPID PANEL
Chol/HDL Ratio: 2.3 ratio (ref 0.0–4.4)
Cholesterol, Total: 151 mg/dL (ref 100–199)
HDL: 67 mg/dL (ref >39)
LDL Chol Calc (NIH): 67 mg/dL (ref 0–99)
Triglycerides: 90 mg/dL (ref 0–149)
VLDL Cholesterol Cal: 17 mg/dL (ref 5–40)

## 2020-11-17 LAB — HEMOGLOBIN A1C
Est. average glucose Bld gHb Est-mCnc: 126 mg/dL
Hgb A1c MFr Bld: 6 % — ABNORMAL HIGH (ref 4.8–5.6)

## 2020-11-17 LAB — TSH: TSH: 0.961 u[IU]/mL (ref 0.450–4.500)

## 2020-11-19 ENCOUNTER — Ambulatory Visit: Payer: Self-pay | Admitting: *Deleted

## 2020-11-19 NOTE — Telephone Encounter (Signed)
Pt given lab results per notes of Dr. Rosanna Randy on 11/19/20. Pt verbalized understanding and reports she has concerns of elevated WBC count. Patient reports c/o sore throat and nausea and fatigue since last night. Reports her husband checked her throat and noted "something back there" , "white patches" in throat. Denies fever, headaches , dry cough, diarrhea. Patient would like something for her sore throat. Encouraged patient to get tested for covid and patient would like to hear from PCP prior to getting tested. PCP knows her hx. Please advise.

## 2020-11-22 NOTE — Telephone Encounter (Signed)
Please advise 

## 2020-11-23 ENCOUNTER — Other Ambulatory Visit: Payer: Self-pay | Admitting: *Deleted

## 2020-11-23 DIAGNOSIS — Z1152 Encounter for screening for COVID-19: Secondary | ICD-10-CM

## 2020-11-23 NOTE — Telephone Encounter (Signed)
Patient was advised.  

## 2020-11-23 NOTE — Telephone Encounter (Signed)
Please arrange Covid test.  Thank you.

## 2020-11-23 NOTE — Progress Notes (Unsigned)
Test ordered

## 2020-11-24 LAB — SARS-COV-2, NAA 2 DAY TAT

## 2020-11-24 LAB — NOVEL CORONAVIRUS, NAA: SARS-CoV-2, NAA: DETECTED — AB

## 2020-11-25 ENCOUNTER — Other Ambulatory Visit (HOSPITAL_COMMUNITY): Payer: Self-pay | Admitting: Adult Health

## 2020-11-25 ENCOUNTER — Other Ambulatory Visit: Payer: Self-pay | Admitting: Adult Health

## 2020-11-25 ENCOUNTER — Telehealth: Payer: Self-pay

## 2020-11-25 MED ORDER — MOLNUPIRAVIR EUA 200MG CAPSULE: 4.0000 | ORAL_CAPSULE | Freq: Two times a day (BID) | ORAL | 0 refills | Status: AC

## 2020-11-25 NOTE — Telephone Encounter (Signed)
Called to discuss with patient about COVID-19 symptoms and the use of one of the available treatments for those with mild to moderate Covid symptoms and at a high risk of hospitalization.  Pt appears to qualify for outpatient treatment due to co-morbid conditions and/or a member of an at-risk group in accordance with the FDA Emergency Use Authorization.    Symptom onset: 11/22/20 Cough,sore throat,chills Vaccinated: Yes Booster? Yes Immunocompromised? No Qualifiers: CHF,HTN   Pt. Would like to speak with APP.  Marcello Moores

## 2020-11-25 NOTE — Progress Notes (Signed)
Outpatient Oral COVID Treatment Note  I connected with Jessica Armstrong on 11/25/2020/2:09 PM by telephone and verified that I am speaking with the correct person using two identifiers.  I discussed the limitations, risks, security, and privacy concerns of performing an evaluation and management service by telephone and the availability of in person appointments. I also discussed with the patient that there may be a patient responsible charge related to this service. The patient expressed understanding and agreed to proceed.  Patient location: Home  Provider location: Office   Diagnosis: COVID-19 infection  Purpose of visit: Discussion of potential use of Molnupiravir or Paxlovid, a new treatment for mild to moderate COVID-19 viral infection in non-hospitalized patients.   Subjective: Patient is a 75 y.o. female who has been diagnosed with COVID 19 viral infection.  Their symptoms began on 2/14 with cold symptoms   Past Medical History:  Diagnosis Date  . Allergies   . Anemia    mild; no medical treatment  . Arthritis   . Carotid artery stenosis    a. doppler 01/8184: RICA 63-14%, LICA 9-70% f/u 1 year  . Cervical spondyloarthritis   . Chronic diastolic CHF (congestive heart failure) ([place])    a. echo 04/2015: EF 55-60%, no RWMA, mitral valve w/ systolic bowing w/o prolapse, PASP 53 mmHg  . Claudication ([place])   . Colon polyp   . Cystitis   . Depression    situational  . Diverticulosis   . Dizziness   . Dyspnea   . Dysrhythmia    bradycardia  . GERD (gastroesophageal reflux disease)   . H/O vertigo   . Heart murmur   . History of measles   . History of mumps   . Hypercholesterolemia   . Hyperglycemia   . Hypertension    vs hypotension  . IBS (irritable bowel syndrome)   . Need for SBE (subacute bacterial endocarditis) prophylaxis   . Neuropathy   . Palpitations   . PONV (postoperative nausea and vomiting)    severe n/v after anesthesia  . Post-menopausal   . Pulmonary valve  dysplasia   . Rotator cuff tear, right   . Seasonal allergies   . TMJ (dislocation of temporomandibular joint)   . TMJ (temporomandibular joint syndrome)   . Tricuspid regurgitation    a. echo 04/2013: EF 55-60%, nl wall motion, mild to mod TR, PASP 35 mm Hg    Allergies  Allergen Reactions  . Cholestyramine Nausea And Vomiting  . Ciprofloxacin Nausea And Vomiting  . Codeine Sulfate Nausea And Vomiting  . Keflex [Cephalexin] Nausea And Vomiting  . Macrobid [Nitrofurantoin Macrocrystal] Nausea And Vomiting  . Sulfa Antibiotics Nausea And Vomiting and Rash     Current Outpatient Medications:  .  albuterol (VENTOLIN HFA) 108 (90 Base) MCG/ACT inhaler, Inhale 2 puffs into the lungs as needed for wheezing or shortness of breath., Disp: , Rfl:  .  baclofen (LIORESAL) 10 MG tablet, Take 1 tablet (10 mg total) by mouth 3 (three) times daily. (Patient not taking: Reported on 11/16/2020), Disp: 30 each, Rfl: 0 .  diazepam (VALIUM) 2 MG tablet, Take 1 tablet (2 mg total) by mouth every 12 (twelve) hours as needed for anxiety. (Patient taking differently: Take 2 mg by mouth every 8 (eight) hours as needed for anxiety.), Disp: 60 tablet, Rfl: 0 .  famotidine (PEPCID) 20 MG tablet, TAKE ONE TABLET BY MOUTH TWICE DAILY, Disp: 180 tablet, Rfl: 3 .  meclizine (ANTIVERT) 25 MG tablet, Take 1 tablet (25  mg total) by mouth 2 (two) times daily as needed for dizziness., Disp: 60 tablet, Rfl: 2 .  methylPREDNISolone (MEDROL DOSEPAK) 4 MG TBPK tablet, Taper down by 1 tablet by mouth daily starting at 6 day 1, then, 5 day 2, 4 day 3, 3 day 4, 2 day 5 and 1 day 6. Divide daily dosage among meals and bedtime., Disp: 21 tablet, Rfl: 0 .  PARoxetine (PAXIL) 20 MG tablet, TAKE 1 TABLET BY MOUTH DAILY, Disp: 90 tablet, Rfl: 2 .  rosuvastatin (CRESTOR) 10 MG tablet, TAKE ONE TABLET EVERY DAY, Disp: 90 tablet, Rfl: 3 .  triamterene-hydrochlorothiazide (DYAZIDE) 37.5-25 MG capsule, TAKE 1 CAPSULE BY MOUTH EVERY DAY, Disp:  90 capsule, Rfl: 0  Objective: Patient appears/sounds okay .  They are in no apparent distress.  Breathing is non labored.  Mood and behavior are normal.  Laboratory Data:  Recent Results (from the past 2160 hour(s))  CBC with Differential/Platelet     Status: Abnormal   Collection Time: 08/27/20  2:27 PM  Result Value Ref Range   WBC 7.2 3.4 - 10.8 x10E3/uL   RBC 3.87 3.77 - 5.28 x10E6/uL   Hemoglobin 12.2 11.1 - 15.9 g/dL   Hematocrit 35.5 34.0 - 46.6 %   MCV 92 79 - 97 fL   MCH 31.5 26.6 - 33.0 pg   MCHC 34.4 31.5 - 35.7 g/dL   RDW 11.9 11.7 - 15.4 %   Platelets 273 150 - 450 x10E3/uL   Neutrophils 57 Not Estab. %   Lymphs 23 Not Estab. %   Monocytes 15 Not Estab. %   Eos 4 Not Estab. %   Basos 1 Not Estab. %   Neutrophils Absolute 4.1 1.4 - 7.0 x10E3/uL   Lymphocytes Absolute 1.7 0.7 - 3.1 x10E3/uL   Monocytes Absolute 1.0 (H) 0.1 - 0.9 x10E3/uL   EOS (ABSOLUTE) 0.3 0.0 - 0.4 x10E3/uL   Basophils Absolute 0.1 0.0 - 0.2 x10E3/uL   Immature Granulocytes 0 Not Estab. %   Immature Grans (Abs) 0.0 0.0 - 0.1 x10E3/uL  Comprehensive metabolic panel     Status: Abnormal   Collection Time: 08/27/20  2:27 PM  Result Value Ref Range   Glucose 81 65 - 99 mg/dL   BUN 23 8 - 27 mg/dL   Creatinine, Ser 0.97 0.57 - 1.00 mg/dL   GFR calc non Af Amer 57 (L) >59 mL/min/1.73   GFR calc Af Amer 66 >59 mL/min/1.73    Comment: **In accordance with recommendations from the NKF-ASN Task force,**   [facility] is in the process of updating its eGFR calculation to the   2021 CKD-EPI creatinine equation that estimates kidney function   without a race variable.    BUN/Creatinine Ratio 24 12 - 28   Sodium 142 134 - 144 mmol/L   Potassium 5.1 3.5 - 5.2 mmol/L   Chloride 104 96 - 106 mmol/L   CO2 23 20 - 29 mmol/L   Calcium 9.8 8.7 - 10.3 mg/dL   Total Protein 7.2 6.0 - 8.5 g/dL   Albumin 4.5 3.7 - 4.7 g/dL   Globulin, Total 2.7 1.5 - 4.5 g/dL   Albumin/Globulin Ratio 1.7 1.2 - 2.2   Bilirubin  Total 0.3 0.0 - 1.2 mg/dL   Alkaline Phosphatase 88 44 - 121 IU/L    Comment:               **Please note reference interval change**   AST 19 0 - 40 IU/L   ALT 12  0 - 32 IU/L  TSH     Status: None   Collection Time: 08/27/20  2:27 PM  Result Value Ref Range   TSH 2.270 0.450 - 4.500 uIU/mL  Lipid panel     Status: None   Collection Time: 11/16/20 11:24 AM  Result Value Ref Range   Cholesterol, Total 151 100 - 199 mg/dL   Triglycerides 90 0 - 149 mg/dL   HDL 67 >39 mg/dL   VLDL Cholesterol Cal 17 5 - 40 mg/dL   LDL Chol Calc (NIH) 67 0 - 99 mg/dL   Chol/HDL Ratio 2.3 0.0 - 4.4 ratio    Comment:                                   T. Chol/HDL Ratio                                             Men  Women                               1/2 Avg.Risk  3.4    3.3                                   Avg.Risk  5.0    4.4                                2X Avg.Risk  9.6    7.1                                3X Avg.Risk 23.4   11.0   Hemoglobin A1c     Status: Abnormal   Collection Time: 11/16/20 11:24 AM  Result Value Ref Range   Hgb A1c MFr Bld 6.0 (H) 4.8 - 5.6 %    Comment:          Prediabetes: 5.7 - 6.4          Diabetes: >6.4          Glycemic control for adults with diabetes: <7.0    Est. average glucose Bld gHb Est-mCnc 126 mg/dL  Comprehensive metabolic panel     Status: Abnormal   Collection Time: 11/16/20 11:24 AM  Result Value Ref Range   Glucose 124 (H) 65 - 99 mg/dL   BUN 26 8 - 27 mg/dL   Creatinine, Ser 1.00 0.57 - 1.00 mg/dL   GFR calc non Af Amer 55 (L) >59 mL/min/1.73   GFR calc Af Amer 63 >59 mL/min/1.73    Comment: **In accordance with recommendations from the NKF-ASN Task force,**   [facility] is in the process of updating its eGFR calculation to the   2021 CKD-EPI creatinine equation that estimates kidney function   without a race variable.    BUN/Creatinine Ratio 26 12 - 28   Sodium 145 (H) 134 - 144 mmol/L   Potassium 4.6 3.5 - 5.2 mmol/L   Chloride 107 (H) 96  - 106 mmol/L   CO2 17 (L) 20 - 29 mmol/L   Calcium 10.1 8.7 -  10.3 mg/dL   Total Protein 7.5 6.0 - 8.5 g/dL   Albumin 4.9 (H) 3.7 - 4.7 g/dL   Globulin, Total 2.6 1.5 - 4.5 g/dL   Albumin/Globulin Ratio 1.9 1.2 - 2.2   Bilirubin Total 0.2 0.0 - 1.2 mg/dL   Alkaline Phosphatase 80 44 - 121 IU/L   AST 17 0 - 40 IU/L   ALT 12 0 - 32 IU/L  CBC with Differential/Platelet     Status: Abnormal   Collection Time: 11/16/20 11:24 AM  Result Value Ref Range   WBC 12.4 (H) 3.4 - 10.8 x10E3/uL   RBC 3.76 (L) 3.77 - 5.28 x10E6/uL   Hemoglobin 11.8 11.1 - 15.9 g/dL   Hematocrit 34.2 34.0 - 46.6 %   MCV 91 79 - 97 fL   MCH 31.4 26.6 - 33.0 pg   MCHC 34.5 31.5 - 35.7 g/dL   RDW 12.0 11.7 - 15.4 %   Platelets 254 150 - 450 x10E3/uL   Neutrophils 83 Not Estab. %   Lymphs 8 Not Estab. %   Monocytes 9 Not Estab. %   Eos 0 Not Estab. %   Basos 0 Not Estab. %   Neutrophils Absolute 10.3 (H) 1.4 - 7.0 x10E3/uL   Lymphocytes Absolute 1.0 0.7 - 3.1 x10E3/uL   Monocytes Absolute 1.1 (H) 0.1 - 0.9 x10E3/uL   EOS (ABSOLUTE) 0.0 0.0 - 0.4 x10E3/uL   Basophils Absolute 0.0 0.0 - 0.2 x10E3/uL   Immature Granulocytes 0 Not Estab. %   Immature Grans (Abs) 0.0 0.0 - 0.1 x10E3/uL  TSH     Status: None   Collection Time: 11/16/20 11:24 AM  Result Value Ref Range   TSH 0.961 0.450 - 4.500 uIU/mL  Novel Coronavirus, NAA ([facility])     Status: Abnormal   Collection Time: 11/23/20 12:00 AM   Specimen: Nasopharyngeal(NP) swabs in vial transport medium   Nasopharynge  Result Value Ref Range   SARS-CoV-2, NAA Detected (A) Not Detected    Comment: Patients who have a positive COVID-19 test result may now have treatment options. Treatment options are available for patients with mild to moderate symptoms and for hospitalized patients. Visit our website at [email or web address] for resources and information. This nucleic acid amplification test was developed and its performance characteristics determined  by [employer]. Nucleic acid amplification tests include RT-PCR and TMA. This test has not been FDA cleared or approved. This test has been authorized by FDA under an Emergency Use Authorization (EUA). This test is only authorized for the duration of time the declaration that circumstances exist justifying the authorization of the emergency use of in vitro diagnostic tests for detection of SARS-CoV-2 virus and/or diagnosis of COVID-19 infection under section 564(b)(1) of the Act, 21 U.S.C. 295MWU-1(L) (1), unless the authorization is terminated or revoked sooner. When diagnostic testing is negativ e, the possibility of a false negative result should be considered in the context of a patient's recent exposures and the presence of clinical signs and symptoms consistent with COVID-19. An individual without symptoms of COVID-19 and who is not shedding SARS-CoV-2 virus would expect to have a negative (not detected) result in this assay.   SARS-COV-2, NAA 2 DAY TAT     Status: None   Collection Time: 11/23/20 12:00 AM   Nasopharynge  Result Value Ref Range   SARS-CoV-2, NAA 2 DAY TAT Performed      Assessment: 75 y.o. female with mild/moderate COVID 19 viral infection diagnosed on 2/15  at high risk for progression  to severe COVID 19.  Plan:  This patient is a 75 y.o. female that meets the following criteria for Emergency Use Authorization of: Molnupiravir  1. Age >18 yr 2. SARS-COV-2 positive test 3. Symptom onset < 5 days 4. Mild-to-moderate COVID disease with high risk for severe progression to hospitalization or death   I have spoken and communicated the following to the patient or parent/caregiver regarding: 1. Molnupiravir is an unapproved drug that is authorized for use under an [employer].  2. There are no adequate, approved, available products for the treatment of COVID-19 in adults who have mild-to-moderate COVID-19 and are at high risk for  progressing to severe COVID-19, including hospitalization or death. 3. Other therapeutics are currently authorized. For additional information on all products authorized for treatment or prevention of COVID-19, please see [email or web address].  4. There are benefits and risks of taking this treatment as outlined in the "Fact Sheet for Patients and Caregivers."  5. "Fact Sheet for Patients and Caregivers" was reviewed with patient. A hard copy will be provided to patient from pharmacy prior to the patient receiving treatment. 6. Patients should continue to self-isolate and use infection control measures (e.g., wear mask, isolate, social distance, avoid sharing personal items, clean and disinfect "high touch" surfaces, and frequent handwashing) according to CDC guidelines.  7. The patient or parent/caregiver has the option to accept or refuse treatment. 8. [place] has established a pregnancy surveillance program. 9. Females of childbearing potential should use a reliable method of contraception correctly and consistently, as applicable, for the duration of treatment and for 4 days after the last dose of Molnupiravir. 80. Males of reproductive potential who are sexually active with females of childbearing potential should use a reliable method of contraception correctly and consistently during treatment and for at least 3 months after the last dose. 11. Pregnancy status and risk was assessed. Patient verbalized understanding of precautions.   After reviewing above information with the patient, the patient agrees to receive molnupiravir.  Follow up instructions:    . Take prescription BID x 5 days as directed . Reach out to pharmacist for counseling on medication if desired . For concerns regarding further COVID symptoms please follow up with your PCP or urgent care . For urgent or  life-threatening issues, seek care at your local emergency department  The patient was provided an opportunity to ask questions, and all were answered. The patient agreed with the plan and demonstrated an understanding of the instructions.   Script sent to [place] and opted to pick up RX.  The patient was advised to call their PCP or seek an in-person evaluation if the symptoms worsen or if the condition fails to improve as anticipated.   I provided 22  minutes of non face-to-face telephone visit time during this encounter, and > 50% was spent counseling as documented under my assessment & plan.  Rexene Edison, NP 11/25/2020 /2:09 PM

## 2020-11-26 ENCOUNTER — Telehealth: Payer: Self-pay | Admitting: Family Medicine

## 2020-11-26 NOTE — Telephone Encounter (Signed)
Pt was starting on molnupiravir for covid and would like to know if its ok for her to take and would like dr Rosanna Randy or his nurse to return her call. Pt also received a call from health department and [facility]

## 2020-11-26 NOTE — Telephone Encounter (Signed)
I see no reason she cannot take this medication

## 2020-11-26 NOTE — Telephone Encounter (Signed)
Pt advised.   Thanks,   -Mairin Lindsley  

## 2020-11-26 NOTE — Telephone Encounter (Signed)
Pt called back, stating that she would like to be called as soon as possible. Best contact: [phone]

## 2020-12-17 ENCOUNTER — Other Ambulatory Visit: Payer: Self-pay | Admitting: Family Medicine

## 2020-12-17 DIAGNOSIS — S0300XS Dislocation of jaw, unspecified side, sequela: Secondary | ICD-10-CM

## 2020-12-17 NOTE — Telephone Encounter (Signed)
Requested medication (s) are due for refill today: expired medication  Requested medication (s) are on the active medication list: yes   Last refill:  09/17/2019 #60 0 refills  Future visit scheduled: yes in 11 months   Notes to clinic:  not delegated per protocol, expired medication.      Requested Prescriptions  Pending Prescriptions Disp Refills   diazepam (VALIUM) 2 MG tablet [Pharmacy Med Name: DIAZEPAM 2 MG TAB] 60 tablet     Sig: TAKE ONE TABLET (2 MG) BY MOUTH EVERY 12HOURS AS NEEDED FOR ANXIETY      Not Delegated - Psychiatry:  Anxiolytics/Hypnotics Failed - 12/17/2020  4:46 PM      Failed - This refill cannot be delegated      Failed - Urine Drug Screen completed in last 360 days      Passed - Valid encounter within last 6 months    Recent Outpatient Visits           1 month ago Encounter for subsequent annual wellness visit (AWV) in Medicare patient   [facility] Jerrol Banana., MD   3 months ago Acute right-sided low back pain without sciatica   [facility] Chrismon, Vickki Muff, PA-C   5 months ago Acute right-sided low back pain without sciatica   [facility] Jerrol Banana., MD   8 months ago Fatigue, unspecified type   [facility] Jerrol Banana., MD   1 year ago Encounter for subsequent annual wellness visit (AWV) in Medicare patient   [facility] Jerrol Banana., MD       Future Appointments             In 11 months Jerrol Banana., MD [facility], [place]

## 2020-12-20 ENCOUNTER — Other Ambulatory Visit: Payer: Self-pay | Admitting: Family Medicine

## 2020-12-22 ENCOUNTER — Other Ambulatory Visit: Payer: Self-pay | Admitting: Neurosurgery

## 2020-12-22 DIAGNOSIS — M5136 Other intervertebral disc degeneration, lumbar region: Secondary | ICD-10-CM | POA: Diagnosis not present

## 2020-12-22 DIAGNOSIS — M47819 Spondylosis without myelopathy or radiculopathy, site unspecified: Secondary | ICD-10-CM | POA: Diagnosis not present

## 2020-12-22 DIAGNOSIS — M5441 Lumbago with sciatica, right side: Secondary | ICD-10-CM | POA: Diagnosis not present

## 2020-12-22 DIAGNOSIS — M8580 Other specified disorders of bone density and structure, unspecified site: Secondary | ICD-10-CM | POA: Diagnosis not present

## 2020-12-29 DIAGNOSIS — R55 Syncope and collapse: Secondary | ICD-10-CM | POA: Diagnosis not present

## 2020-12-29 DIAGNOSIS — R0602 Shortness of breath: Secondary | ICD-10-CM | POA: Diagnosis not present

## 2020-12-29 DIAGNOSIS — I5032 Chronic diastolic (congestive) heart failure: Secondary | ICD-10-CM | POA: Diagnosis not present

## 2020-12-29 DIAGNOSIS — Z01810 Encounter for preprocedural cardiovascular examination: Secondary | ICD-10-CM | POA: Diagnosis not present

## 2020-12-29 DIAGNOSIS — E7800 Pure hypercholesterolemia, unspecified: Secondary | ICD-10-CM | POA: Diagnosis not present

## 2020-12-29 DIAGNOSIS — R079 Chest pain, unspecified: Secondary | ICD-10-CM | POA: Diagnosis not present

## 2020-12-29 DIAGNOSIS — I10 Essential (primary) hypertension: Secondary | ICD-10-CM | POA: Diagnosis not present

## 2021-01-06 ENCOUNTER — Other Ambulatory Visit: Payer: Self-pay | Admitting: Neurosurgery

## 2021-01-06 DIAGNOSIS — M8580 Other specified disorders of bone density and structure, unspecified site: Secondary | ICD-10-CM

## 2021-01-10 ENCOUNTER — Other Ambulatory Visit: Payer: Self-pay

## 2021-01-10 ENCOUNTER — Other Ambulatory Visit
Admission: RE | Admit: 2021-01-10 | Discharge: 2021-01-10 | Disposition: A | Discharge status: Home / Self Care | Payer: PPO | Source: Ambulatory Visit | Attending: Neurosurgery | Admitting: Neurosurgery

## 2021-01-10 ENCOUNTER — Ambulatory Visit
Admission: RE | Admit: 2021-01-10 | Discharge: 2021-01-10 | Disposition: A | Discharge status: Home / Self Care | Payer: PPO | Source: Ambulatory Visit | Attending: Neurosurgery | Admitting: Neurosurgery

## 2021-01-10 DIAGNOSIS — Z20822 Contact with and (suspected) exposure to covid-19: Secondary | ICD-10-CM | POA: Insufficient documentation

## 2021-01-10 DIAGNOSIS — M85852 Other specified disorders of bone density and structure, left thigh: Secondary | ICD-10-CM | POA: Diagnosis not present

## 2021-01-10 DIAGNOSIS — M8580 Other specified disorders of bone density and structure, unspecified site: Secondary | ICD-10-CM

## 2021-01-10 DIAGNOSIS — Z780 Asymptomatic menopausal state: Secondary | ICD-10-CM | POA: Insufficient documentation

## 2021-01-10 LAB — SARS CORONAVIRUS 2 (TAT 6-24 HRS): SARS Coronavirus 2: NEGATIVE

## 2021-01-11 NOTE — H&P (Signed)
Patient ID:   [id number] [name]  Date of Birth: 12-17-1945 Visit Type: Office Visit   Date: 12/22/2020 11:45 AM Provider: Marchia Meiers. Vertell Limber MD   This 75 year old female presents for pain.  HISTORY OF PRESENT ILLNESS: 1.  pain  11/15/2020 L4-5 ESI by Dr. Davy Pique  Patient returns as scheduled, reporting relief for only a few weeks following lumbar injection.  Medrol Dosepak prescribed March 4th for pain exacerbation provided significant relief only during the taper.  She found no relief with Neurontin and it was stopped.  Tramadol does offer some relief but has only taken periodically.  Currently, she notes right-sided lumbar pain "all the time" with periodic pain exacerbations for which she takes tramadol.  MRI on canopy  The patient feels that she did not improve a great deal with the injection.  She currently describes her pain at 5/10 in severity.  I reviewed her previous radiographs which show 7.5 mm of anterolisthesis of L4 on L5 on flexion which decreases to 2.5 mm of anterolisthesis on extension.  Her examination reveals 4/5 right EHL strength and she has positive seated straight leg raise on the right.  The patient has not had a bone density test over the last 15 years and I suggested that we do so.  I have recommended on based on my review of her imaging and of her continued pain complain despite conservative management and injections that we proceed with decompression and fusion at the L4-5 level with TLIF at L4-5 on the right.  The patient wishes to proceed with surgery.  We will set this up in the near future.  She is going to get a bone density test.  She has a LSO brace already      Medical/Surgical/Interim History Reviewed, no change.  Last detailed document date:03/28/2017.     Family History: Reviewed, no changes.  Last detailed document date:07/28/2013.   Social History: Reviewed, no changes. Last detailed document date: 07/28/2013.     MEDICATIONS: (added, continued or stopped this visit) Started Medication Directions Instruction Stopped 12/22/2020 aspirin 81 mg tablet,delayed release take 1 tablet by oral route  every day    Centrum Silver 0.4 mg-300 mcg-250 mcg tablet take 1 tablet by oral route every day    famotidine 20 mg tablet take 1 tablet by oral route 2 times every day    hydralazine 50 mg tablet take 1 tablet by oral route 3 times every day with food   12/10/2020 Medrol (Pak) 4 mg tablets in a dose pack take by oral route as directed per package instructions   12/07/2020 methocarbamol 500 mg tablet take 1 tablet by oral route 4 times every day   12/07/2020 Neurontin 300 mg capsule take 1 capsule by oral route 3 times every day    paroxetine 20 mg tablet take 1 tablet by oral route  every day    rosuvastatin 10 mg tablet take 1 tablet by oral route  every day   12/07/2020 tramadol 50 mg tablet take 1 tablet by oral route  every 6 hours as needed    triamterene 37.5 mg-hydrochlorothiazide 25 mg capsule take 1 capsule by oral route  every day   11/01/2020 Valium 5 mg tablet take 45 min prior to arrival      ALLERGIES: Ingredient Reaction Medication Name Comment CODEINE Nausea/Vomiting   SULFA (SULFONAMIDE ANTIBIOTICS)    ADHESIVE TAPE     Reviewed, no changes.    PHYSICAL EXAM:  Vitals Date Temp  F BP Pulse Ht In Wt Lb BMI BSA Pain Score 12/22/2020  152/70 70 67 133 20.83  5/10     IMPRESSION:  Persistent significant right leg pain and weakness with L4-5 spondylolisthesis.  PLAN: Proceed with bone density testing and TLIF L4-5 right.  Risks and benefits were discussed in detail with the patient and she wishes to proceed with surgery.  Patient education was performed.  Orders: Office Procedures/Services: Assessment Service Comments M85.80 Bone Density emerge ortho in [place]   Diagnostic  Procedures: Assessment Procedure M54.16 Lumbar Spine- AP/Lat Miscellaneous: Assessment  M43.16 LSO Brace  Assessment/Plan  # Detail Type Description  1. Assessment Osteopenia determined by x-ray (M85.80).  Plan Orders Bone Density emerge orth[facility] in [place].     2. Assessment Lumbar radiculopathy (M54.16).     3. Assessment Spondylolisthesis at L4-L5 level (M43.16).  Plan Orders LSO Brace.          MEDICATIONS PRESCRIBED TODAY    Rx Quantity Refills ASPIRIN EC 81 mg  0 0           Provider:  Marchia Meiers. Vertell Limber MD  12/24/2020 02:10 PM    Dictation edited by: Marchia Meiers. Vertell Limber    CC Providers: Miguel As[address],  [place]  [id number]   [facility] [address] [place], [place] [address]-               Electronically signed by Marchia Meiers. Vertell Limber MD on 12/24/2020 02:10 PM

## 2021-01-12 ENCOUNTER — Encounter (HOSPITAL_COMMUNITY): Payer: Self-pay | Admitting: Neurosurgery

## 2021-01-12 NOTE — Progress Notes (Signed)
EKG: DOS CXR: 11/09/17 ECHO: 04/29/15 Stress Test: 05/26/15 Cardiac Cath: denies  Fasting Blood Sugar- na Checks Blood Sugar_na__ times a day  OSA/CPAP: No  ASA:  Last dose 01/06/21 Blood Thinners: No  Covid test 01/10/21 negative  Anesthesia Review: No  Patient denies shortness of breath, fever, cough, and chest pain at PAT appointment.  Patient verbalized understanding of instructions provided today at the PAT appointment.  Patient asked to review instructions at home and day of surgery.

## 2021-01-12 NOTE — Anesthesia Preprocedure Evaluation (Addendum)
Anesthesia Evaluation  Patient identified by MRN, date of birth, ID band Patient awake    Reviewed: Allergy & Precautions, H&P , NPO status , Patient's Chart, lab work & pertinent test results, reviewed documented beta blocker date and time   History of Anesthesia Complications (+) PONV and history of anesthetic complications  Airway Mallampati: II  TM Distance: >3 FB Neck ROM: full   Comment: TMJ (dislocation of temporomandibular joint) Dental no notable dental hx. (+) Teeth Intact, Dental Advisory Given, Partial Lower   Pulmonary shortness of breath,    Pulmonary exam normal breath sounds clear to auscultation       Cardiovascular Exercise Tolerance: Good hypertension, Pt. on medications +CHF  + dysrhythmias  Rhythm:regular Rate:Normal  Echo 07/11/16 ([facility]) NORMAL LEFT VENTRICULAR SYSTOLIC FUNCTION  WITH MILD LVH  NORMAL RIGHT VENTRICULAR SYSTOLIC FUNCTION  MILD VALVULAR REGURGITATION (Mild MR, TR, PR)  NO VALVULAR STENOSIS  MILD to MODERATE PI  EF >55%    Nuclear stress test 05/03/15: There was no ST segment deviation noted during stress. No T wave inversion was noted during stress. The study is normal. This is a low risk study. The left ventricular ejection fraction is hyperdynamic (>65%).    Neuro/Psych PSYCHIATRIC DISORDERS Anxiety Depression  CV: Carotid US 10/24/19: IMPRESSION: - Minor carotid atherosclerosis. No hemodynamically significant ICA stenosis. Degree of narrowing less than 50% bilaterally by ultrasound criteria. - Patent antegrade vertebral flow bilaterally    GI/Hepatic GERD  Medicated,  Endo/Other    Renal/GU   negative genitourinary   Musculoskeletal  (+) Arthritis , Osteoarthritis,    Abdominal   Peds negative pediatric ROS (+)  Hematology  (+) Blood dyscrasia, anemia ,   Anesthesia Other Findings   Reproductive/Obstetrics negative OB ROS                            Anesthesia Physical Anesthesia Plan  ASA: III  Anesthesia Plan: General   Post-op Pain Management:    Induction: Intravenous  PONV Risk Score and Plan: 3 and Ondansetron, Dexamethasone and Treatment may vary due to age or medical condition  Airway Management Planned: Oral ETT  Additional Equipment: None  Intra-op Plan:   Post-operative Plan: Extubation in OR  Informed Consent: I have reviewed the patients History and Physical, chart, labs and discussed the procedure including the risks, benefits and alternatives for the proposed anesthesia with the patient or authorized representative who has indicated his/her understanding and acceptance.     Dental Advisory Given  Plan Discussed with: CRNA and Anesthesiologist  Anesthesia Plan Comments: (PAT note written 01/12/2021 by Myra Gianotti, PA-C. SAME DAY WORK-UP  History includes never smoker, post-operative N/V, HTN, HLD, palpitations, bradycardia, chronic diastolic CHF, carotid artery stenosis (< 50% BICA 10/2019), dyspnea, TMJ, syncope (on standing 10/09/19, bradycardia with minimal HR 44 on Holter, no plans for PPM as of 04/26/20), vertigo, murmur (mild MR, TR, PR 2017), anemia, GERD, neuropathy, hyperglycemia (A1c 6.0% 11/16/20), ACDF (C6-7 08/26/13). + COVID-19 11/23/20. Cardiology evaluation by Dr. Saralyn Pilar on 12/29/20 (see [place]). He wrote, "Proceed with lumbar disc surgery as planned... Defer further cardiac diagnostics at this time." According to Dr. Saralyn Pilar 12/29/20 office note ([facility]): - 14-day Holter monitor (done after ~ 12/2019 presyncopal episode; patient inquiring about need for PPM) was performed which revealed predominant sinus rhythm, mean heart rate 72 bpm, heart rate range 49 to 183 bpm, infrequent premature atrial contractions, and infrequent atrial runs the longest  lasting 14 beats. (Per 04/26/20 follow-up visit: "Defer permanent pacemaker  implantation")   72-hour Holter monitor (done after 10/09/19 syncopal episode) was performed which revealed predominant sinus rhythm with mean heart rate of 59 bpm, sinus bradycardia with minimal heart rate of 44 bpm, infrequent premature atrial contractions, and brief atrial runs the longest lasting 7 beats, without clinical correlation.  )       Anesthesia Quick Evaluation

## 2021-01-12 NOTE — Progress Notes (Signed)
Anesthesia Chart Review: SAME DAY WORK-UP   Case: [id number] Date/Time: 01/13/21 1115   Procedure: Right Lumbar 4-5 Transforaminal lumbar interbody fusion (Right Back) - 3C/RM 20   Anesthesia type: General   Pre-op diagnosis: Spondylolisthesis   Location: [place] OR ROOM 70 / [place] OR   Surgeons: Erline Levine, MD      DISCUSSION: Patient is a 75 year old female scheduled for the above procedure.  History includes never smoker, post-operative N/V, HTN, HLD, palpitations, bradycardia, chronic diastolic CHF, carotid artery stenosis (< 50% BICA 10/2019), dyspnea, TMJ, syncope (on standing 10/09/19, bradycardia with minimal HR 44 on Holter, no plans for PPM as of 04/26/20), vertigo, murmur (mild MR, TR, PR 2017), anemia, GERD, neuropathy, hyperglycemia (A1c 6.0% 11/16/20), ACDF (C6-7 08/26/13). + COVID-19 11/23/20.  Cardiology evaluation by Dr. Saralyn Pilar on 12/29/20 (see [place]). He wrote, "Proceed with lumbar disc surgery as planned. Defer further cardiac diagnostics at this time."  Presurgical COVID-19 test negative on 01/10/2021.  Anesthesia team to evaluate on the day of surgery.  She is for preoperative labs and EKG as indicated.   VS:   BP Readings from Last 3 Encounters:  11/16/20 (!) 141/60  08/27/20 133/63  06/22/20 110/63   Pulse Readings from Last 3 Encounters:  11/16/20 69  08/27/20 69  06/22/20 64    PROVIDERS: Jerrol Banana., MD is PCP  Isaias Cowman, MD is cardiologist [facility], see [place])   LABS: For day of surgery. As of 11/16/20 most recent lab results include: Lab Results  Component Value Date   WBC 12.4 (H) 11/16/2020   HGB 11.8 11/16/2020   HCT 34.2 11/16/2020   PLT 254 11/16/2020   GLUCOSE 124 (H) 11/16/2020   ALT 12 11/16/2020   AST 17 11/16/2020   NA 145 (H) 11/16/2020   K 4.6 11/16/2020   CL 107 (H) 11/16/2020   CREATININE 1.00 11/16/2020   BUN 26 11/16/2020   CO2 17 (L) 11/16/2020   TSH 0.961 11/16/2020   HGBA1C  6.0 (H) 11/16/2020     IMAGES: MRI L-spine 10/16/20: IMPRESSION: 1. T12-L1: Right paracentral disc herniation with slight upward migration. This indents the thecal sac but does not cause visible neural compression. 2. L3-4: Disc bulge. Facet hypertrophy. Mild foraminal narrowing on the left with some potential to affect the exiting left L3 nerve. 3. L4-5: Facet osteoarthritis with 2 mm of anterolisthesis. Mild bulging of the disc. Mild stenosis of both lateral recesses but without visible neural compression. The facet arthritis could be a cause of back pain or referred facet syndrome pain. 4. L5-S1: Facet osteoarthritis with 2 mm of anterolisthesis. Mild bulging of the disc. Mild narrowing of the subarticular lateral recess on the left but without visible neural compression. The facet arthritis could be a cause of back pain or referred facet syndrome pain.   EKG: Last EKG seen is > 1 year ago.    CV: Carotid US 10/24/19: IMPRESSION: - Minor carotid atherosclerosis. No hemodynamically significant ICA stenosis. Degree of narrowing less than 50% bilaterally by ultrasound criteria. - Patent antegrade vertebral flow bilaterally   According to Dr. Saralyn Pilar 12/29/20 office note ([facility]): - 14-day Holter monitor (done after ~ 12/2019 presyncopal episode; patient inquiring about need for PPM) was performed which revealed predominant sinus rhythm, mean heart rate 72 bpm, heart rate range 49 to 183 bpm, infrequent premature atrial contractions, and infrequent atrial runs the longest lasting 14 beats. (Per 04/26/20 follow-up visit: "Defer permanent pacemaker implantation")  -  72-hour Holter monitor (done after 10/09/19 syncopal episode) was performed which revealed predominant sinus rhythm with mean heart rate of 59 bpm, sinus bradycardia with minimal heart rate of 44 bpm, infrequent premature atrial contractions, and brief atrial runs the longest lasting 7 beats, without clinical  correlation.    Echo 07/11/16 ([facility]) INTERPRETATION  NORMAL LEFT VENTRICULAR SYSTOLIC FUNCTION  WITH MILD LVH  NORMAL RIGHT VENTRICULAR SYSTOLIC FUNCTION  MILD VALVULAR REGURGITATION (Mild MR, TR, PR)  NO VALVULAR STENOSIS  MILD to MODERATE PI  EF >55%    Nuclear stress test 05/03/15:  There was no ST segment deviation noted during stress.  No T wave inversion was noted during stress.  The study is normal.  This is a low risk study.  The left ventricular ejection fraction is hyperdynamic (>65%).   Past Medical History:  Diagnosis Date  . Allergies   . Anemia    mild; no medical treatment  . Arthritis   . Carotid artery stenosis    a. doppler 0/6237: RICA 62-83%, LICA 1-51% f/u 1 year  . Cervical spondyloarthritis   . Chronic diastolic CHF (congestive heart failure) ([place])    a. echo 04/2015: EF 55-60%, no RWMA, mitral valve w/ systolic bowing w/o prolapse, PASP 53 mmHg  . Claudication ([place])   . Colon polyp   . Cystitis   . Depression    situational  . Diverticulosis   . Dizziness   . Dyspnea   . Dysrhythmia    bradycardia  . GERD (gastroesophageal reflux disease)   . H/O vertigo   . Heart murmur   . History of measles   . History of mumps   . Hypercholesterolemia   . Hyperglycemia   . Hypertension    vs hypotension  . IBS (irritable bowel syndrome)   . Need for SBE (subacute bacterial endocarditis) prophylaxis   . Neuropathy   . Palpitations   . PONV (postoperative nausea and vomiting)    severe n/v after anesthesia  . Post-menopausal   . Pulmonary valve dysplasia   . Rotator cuff tear, right   . Seasonal allergies   . TMJ (dislocation of temporomandibular joint)   . TMJ (temporomandibular joint syndrome)   . Tricuspid regurgitation    a. echo 04/2013: EF 55-60%, nl wall motion, mild to mod TR, PASP 35 mm Hg    Past Surgical History:  Procedure Laterality Date  . ABDOMINAL HYSTERECTOMY    . ANTERIOR CERVICAL DECOMP/DISCECTOMY FUSION N/A  08/26/2013   Procedure: CERVICAL SIX TO SEVEN ANTERIOR CERVICAL DECOMPRESSION/DISCECTOMY FUSION 1 LEVEL;  Surgeon: Erline Levine, MD;  Location: [place] NEURO ORS;  Service: Neurosurgery;  Laterality: N/A;  C6-7 Anterior cervical decompression/diskectomy/fusion  . BREAST BIOPSY Right    neg  . CATARACT EXTRACTION W/PHACO Left 05/05/2020   Procedure: CATARACT EXTRACTION PHACO AND INTRAOCULAR LENS PLACEMENT ([place]) LEFT;  Surgeon: Leandrew Koyanagi, MD;  Location: [place];  Service: Ophthalmology;  Laterality: Left;  3.80 0:43.4 8.8%  . CATARACT EXTRACTION W/PHACO Right 06/02/2020   Procedure: CATARACT EXTRACTION PHACO AND INTRAOCULAR LENS PLACEMENT ([place]) RIGHT;  Surgeon: Leandrew Koyanagi, MD;  Location: [place];  Service: Ophthalmology;  Laterality: Right;  4.97 0:57.8 8.6%  . CHOLECYSTECTOMY    . COLONOSCOPY WITH PROPOFOL N/A 01/14/2016   Procedure: COLONOSCOPY WITH PROPOFOL;  Surgeon: Manya Silvas, MD;  Location: [facility] ENDOSCOPY;  Service: Endoscopy;  Laterality: N/A;  . COLONOSCOPY WITH PROPOFOL N/A 11/04/2018   Procedure: COLONOSCOPY WITH PROPOFOL;  Surgeon: Manya Silvas, MD;  Location: [facility] ENDOSCOPY;  Service: Endoscopy;  Laterality: N/A;  . COLONOSCOPY WITH PROPOFOL N/A 07/09/2019   Procedure: COLONOSCOPY WITH PROPOFOL;  Surgeon: [place], Benay Pike, MD;  Location: [facility] ENDOSCOPY;  Service: Gastroenterology;  Laterality: N/A;  . ESOPHAGOGASTRODUODENOSCOPY (EGD) WITH PROPOFOL N/A 01/14/2016   Procedure: ESOPHAGOGASTRODUODENOSCOPY (EGD) WITH PROPOFOL;  Surgeon: Manya Silvas, MD;  Location[facility] END[facility]COPY;  Service: Endoscopy;  Laterality: N/A;  . ESOPHAGOGASTRODUODENOSCOPY (EGD) WITH PROPOFOL N/A 11/04/2018   Procedure: ESOPHAGOGASTRODUODENOSCOPY (EGD) WITH PROPOFOL;  Surgeon: Manya Silvas, MD;  Location: [facility] ENDOSCOPY;  Service: Endoscopy;  Laterality: N/A;  . EYE SURGERY     benign tumor of eye  . SHOULDER ARTHROSCOPY WITH ROTATOR CUFF REPAIR AND  SUBACROMIAL DECOMPRESSION Right 05/31/2017   Procedure: SHOULDER ARTHROSCOPY WITH SUBACROMIAL DECOMPRESSION AND DISTAL CLAVICAL EXCISION;  Surgeon: Tania Ade, MD;  Location: [place];  Service: Orthopedics;  Laterality: Right;  SHOULDER ARTHROSCOPY WITH SUBACROMIAL DECOMPRESSION AND DISTAL CLAVICAL EXCISION    MEDICATIONS: No current facility-administered medications for this encounter.   Marland Kitchen albuterol (VENTOLIN HFA) 108 (90 Base) MCG/ACT inhaler  . aspirin 81 MG EC tablet  . diazepam (VALIUM) 2 MG tablet  . famotidine (PEPCID) 20 MG tablet  . hydrALAZINE (APRESOLINE) 50 MG tablet  . meclizine (ANTIVERT) 25 MG tablet  . PARoxetine (PAXIL) 20 MG tablet  . rosuvastatin (CRESTOR) 10 MG tablet  . traMADol (ULTRAM) 50 MG tablet  . triamterene-hydrochlorothiazide (DYAZIDE) 37.5-25 MG capsule  . Molnupiravir 200 MG CAPS  (Molnupiravir was prescribed for 5 days 11/25/20 following + COVID-19 test)  Myra Gianotti, PA-C Surgical Short Stay/Anesthesiology [facility] Phone [phone] [facility] Phone [phone] 01/12/2021 1:28 PM

## 2021-01-13 ENCOUNTER — Inpatient Hospital Stay (HOSPITAL_COMMUNITY): Payer: PPO | Admitting: Vascular Surgery

## 2021-01-13 ENCOUNTER — Encounter (HOSPITAL_COMMUNITY)
Admission: RE | Disposition: A | Discharge status: Home / Self Care | Payer: Self-pay | Source: Home / Self Care | Attending: Neurosurgery

## 2021-01-13 ENCOUNTER — Other Ambulatory Visit: Payer: Self-pay

## 2021-01-13 ENCOUNTER — Inpatient Hospital Stay (HOSPITAL_COMMUNITY): Payer: PPO

## 2021-01-13 ENCOUNTER — Inpatient Hospital Stay (HOSPITAL_COMMUNITY)
Admission: RE | Admit: 2021-01-13 | Discharge: 2021-01-14 | DRG: 454 | Disposition: A | Discharge status: Home / Self Care | Payer: PPO | Attending: Neurosurgery | Admitting: Neurosurgery

## 2021-01-13 ENCOUNTER — Encounter (HOSPITAL_COMMUNITY): Payer: Self-pay | Admitting: Neurosurgery

## 2021-01-13 DIAGNOSIS — Z20822 Contact with and (suspected) exposure to covid-19: Secondary | ICD-10-CM | POA: Diagnosis not present

## 2021-01-13 DIAGNOSIS — Z888 Allergy status to other drugs, medicaments and biological substances status: Secondary | ICD-10-CM | POA: Diagnosis not present

## 2021-01-13 DIAGNOSIS — Z8616 Personal history of COVID-19: Secondary | ICD-10-CM

## 2021-01-13 DIAGNOSIS — M4326 Fusion of spine, lumbar region: Secondary | ICD-10-CM | POA: Diagnosis not present

## 2021-01-13 DIAGNOSIS — M4316 Spondylolisthesis, lumbar region: Secondary | ICD-10-CM | POA: Diagnosis present

## 2021-01-13 DIAGNOSIS — Z90710 Acquired absence of both cervix and uterus: Secondary | ICD-10-CM | POA: Diagnosis not present

## 2021-01-13 DIAGNOSIS — Z419 Encounter for procedure for purposes other than remedying health state, unspecified: Secondary | ICD-10-CM

## 2021-01-13 DIAGNOSIS — Z981 Arthrodesis status: Secondary | ICD-10-CM | POA: Diagnosis not present

## 2021-01-13 DIAGNOSIS — Z884 Allergy status to anesthetic agent status: Secondary | ICD-10-CM

## 2021-01-13 DIAGNOSIS — Z885 Allergy status to narcotic agent status: Secondary | ICD-10-CM

## 2021-01-13 DIAGNOSIS — I5032 Chronic diastolic (congestive) heart failure: Secondary | ICD-10-CM | POA: Diagnosis not present

## 2021-01-13 DIAGNOSIS — K219 Gastro-esophageal reflux disease without esophagitis: Secondary | ICD-10-CM | POA: Diagnosis not present

## 2021-01-13 DIAGNOSIS — M5116 Intervertebral disc disorders with radiculopathy, lumbar region: Secondary | ICD-10-CM | POA: Diagnosis not present

## 2021-01-13 DIAGNOSIS — E7800 Pure hypercholesterolemia, unspecified: Secondary | ICD-10-CM | POA: Diagnosis present

## 2021-01-13 DIAGNOSIS — M8580 Other specified disorders of bone density and structure, unspecified site: Secondary | ICD-10-CM | POA: Diagnosis not present

## 2021-01-13 DIAGNOSIS — I110 Hypertensive heart disease with heart failure: Secondary | ICD-10-CM | POA: Diagnosis present

## 2021-01-13 DIAGNOSIS — Z881 Allergy status to other antibiotic agents status: Secondary | ICD-10-CM

## 2021-01-13 DIAGNOSIS — M48061 Spinal stenosis, lumbar region without neurogenic claudication: Secondary | ICD-10-CM | POA: Diagnosis not present

## 2021-01-13 HISTORY — PX: TRANSFORAMINAL LUMBAR INTERBODY FUSION (TLIF) WITH PEDICLE SCREW FIXATION 1 LEVEL: SHX6141

## 2021-01-13 HISTORY — DX: Anxiety disorder, unspecified: F41.9

## 2021-01-13 LAB — BASIC METABOLIC PANEL
Anion gap: 7 (ref 5–15)
BUN: 17 mg/dL (ref 8–23)
CO2: 23 mmol/L (ref 22–32)
Calcium: 9.4 mg/dL (ref 8.9–10.3)
Chloride: 108 mmol/L (ref 98–111)
Creatinine, Ser: 0.99 mg/dL (ref 0.44–1.00)
GFR, Estimated: 59 mL/min — ABNORMAL LOW (ref >60)
Glucose, Bld: 124 mg/dL — ABNORMAL HIGH (ref 70–99)
Potassium: 3.9 mmol/L (ref 3.5–5.1)
Sodium: 138 mmol/L (ref 135–145)

## 2021-01-13 LAB — CBC
HCT: 34.7 % — ABNORMAL LOW (ref 36.0–46.0)
Hemoglobin: 11.7 g/dL — ABNORMAL LOW (ref 12.0–15.0)
MCH: 32.3 pg (ref 26.0–34.0)
MCHC: 33.7 g/dL (ref 30.0–36.0)
MCV: 95.9 fL (ref 80.0–100.0)
Platelets: 257 10*3/uL (ref 150–400)
RBC: 3.62 MIL/uL — ABNORMAL LOW (ref 3.87–5.11)
RDW: 12 % (ref 11.5–15.5)
WBC: 6.9 10*3/uL (ref 4.0–10.5)
nRBC: 0 % (ref 0.0–0.2)

## 2021-01-13 LAB — ABO/RH: ABO/RH(D): A POS

## 2021-01-13 LAB — GLUCOSE, CAPILLARY
Glucose-Capillary: 115 mg/dL — ABNORMAL HIGH (ref 70–99)
Glucose-Capillary: 118 mg/dL — ABNORMAL HIGH (ref 70–99)

## 2021-01-13 LAB — TYPE AND SCREEN
ABO/RH(D): A POS
Antibody Screen: NEGATIVE

## 2021-01-13 LAB — SURGICAL PCR SCREEN
MRSA, PCR: POSITIVE — AB
Staphylococcus aureus: POSITIVE — AB

## 2021-01-13 SURGERY — TRANSFORAMINAL LUMBAR INTERBODY FUSION (TLIF) WITH PEDICLE SCREW FIXATION 1 LEVEL
Anesthesia: General | Site: Spine Lumbar | Laterality: Right

## 2021-01-13 MED ORDER — ONDANSETRON HCL 4 MG/2ML IJ SOLN
INTRAMUSCULAR | Status: DC | PRN
  Administered 2021-01-13: 4 mg via INTRAVENOUS

## 2021-01-13 MED ORDER — KETOROLAC TROMETHAMINE 15 MG/ML IJ SOLN
7.5000 mg | Freq: Four times a day (QID) | INTRAMUSCULAR | Status: DC
  Administered 2021-01-13 – 2021-01-14 (×3): 7.5 mg via INTRAVENOUS
  Filled 2021-01-13 (×3): qty 1

## 2021-01-13 MED ORDER — ONDANSETRON HCL 4 MG/2ML IJ SOLN
4.0000 mg | Freq: Once | INTRAMUSCULAR | Status: AC | PRN
  Administered 2021-01-13: 4 mg via INTRAVENOUS

## 2021-01-13 MED ORDER — SUGAMMADEX SODIUM 200 MG/2ML IV SOLN
INTRAVENOUS | Status: DC | PRN
  Administered 2021-01-13: 200 mg via INTRAVENOUS

## 2021-01-13 MED ORDER — CHLORHEXIDINE GLUCONATE CLOTH 2 % EX PADS: 6.0000 | MEDICATED_PAD | Freq: Once | CUTANEOUS | Status: DC

## 2021-01-13 MED ORDER — FLEET ENEMA 7-19 GM/118ML RE ENEM: 1.0000 | ENEMA | Freq: Once | RECTAL | Status: DC | PRN

## 2021-01-13 MED ORDER — PHENYLEPHRINE HCL (PRESSORS) 10 MG/ML IV SOLN
INTRAVENOUS | Status: DC | PRN
  Administered 2021-01-13: 80 ug via INTRAVENOUS

## 2021-01-13 MED ORDER — PHENYLEPHRINE HCL-NACL 10-0.9 MG/250ML-% IV SOLN
INTRAVENOUS | Status: DC | PRN
  Administered 2021-01-13: 30 ug/min via INTRAVENOUS

## 2021-01-13 MED ORDER — BUPIVACAINE LIPOSOME 1.3 % IJ SUSP
INTRAMUSCULAR | Status: AC
  Filled 2021-01-13: qty 20

## 2021-01-13 MED ORDER — 0.9 % SODIUM CHLORIDE (POUR BTL) OPTIME
TOPICAL | Status: DC | PRN
  Administered 2021-01-13: 1000 mL

## 2021-01-13 MED ORDER — ALUM & MAG HYDROXIDE-SIMETH 200-200-20 MG/5ML PO SUSP: 30.0000 mL | Freq: Four times a day (QID) | ORAL | Status: DC | PRN

## 2021-01-13 MED ORDER — MOLNUPIRAVIR 200 MG PO CAPS: 800.0000 mg | ORAL_CAPSULE | Freq: Two times a day (BID) | ORAL | Status: DC

## 2021-01-13 MED ORDER — ACETAMINOPHEN 325 MG PO TABS: 650.0000 mg | ORAL_TABLET | ORAL | Status: DC | PRN

## 2021-01-13 MED ORDER — VANCOMYCIN HCL IN DEXTROSE 1-5 GM/200ML-% IV SOLN
1000.0000 mg | INTRAVENOUS | Status: AC
  Administered 2021-01-13: 1000 mg via INTRAVENOUS
  Filled 2021-01-13: qty 200

## 2021-01-13 MED ORDER — SODIUM CHLORIDE 0.9% FLUSH: 3.0000 mL | INTRAVENOUS | Status: DC | PRN

## 2021-01-13 MED ORDER — CHLORHEXIDINE GLUCONATE 0.12 % MT SOLN
15.0000 mL | Freq: Once | OROMUCOSAL | Status: AC
  Administered 2021-01-13: 15 mL via OROMUCOSAL
  Filled 2021-01-13: qty 15

## 2021-01-13 MED ORDER — SUFENTANIL CITRATE 50 MCG/ML IV SOLN
INTRAVENOUS | Status: AC
  Filled 2021-01-13: qty 1

## 2021-01-13 MED ORDER — BUPIVACAINE HCL (PF) 0.5 % IJ SOLN
INTRAMUSCULAR | Status: AC
  Filled 2021-01-13: qty 30

## 2021-01-13 MED ORDER — TRIAMTERENE-HCTZ 37.5-25 MG PO TABS
1.0000 | ORAL_TABLET | Freq: Every day | ORAL | Status: DC
  Administered 2021-01-14: 1 via ORAL
  Filled 2021-01-13 (×2): qty 1

## 2021-01-13 MED ORDER — FENTANYL CITRATE (PF) 100 MCG/2ML IJ SOLN
25.0000 ug | INTRAMUSCULAR | Status: DC | PRN
  Administered 2021-01-13 (×2): 25 ug via INTRAVENOUS

## 2021-01-13 MED ORDER — PAROXETINE HCL 10 MG PO TABS
10.0000 mg | ORAL_TABLET | Freq: Every day | ORAL | Status: DC
  Administered 2021-01-14: 10 mg via ORAL
  Filled 2021-01-13 (×2): qty 1

## 2021-01-13 MED ORDER — MUPIROCIN 2 % EX OINT
1.0000 "application " | TOPICAL_OINTMENT | Freq: Two times a day (BID) | CUTANEOUS | Status: DC
  Administered 2021-01-13 – 2021-01-14 (×2): 1 via NASAL
  Filled 2021-01-13: qty 22

## 2021-01-13 MED ORDER — MIDAZOLAM HCL 2 MG/2ML IJ SOLN
INTRAMUSCULAR | Status: AC
  Filled 2021-01-13: qty 2

## 2021-01-13 MED ORDER — EPHEDRINE SULFATE 50 MG/ML IJ SOLN
INTRAMUSCULAR | Status: DC | PRN
  Administered 2021-01-13: 10 mg via INTRAVENOUS

## 2021-01-13 MED ORDER — MECLIZINE HCL 25 MG PO TABS
25.0000 mg | ORAL_TABLET | Freq: Two times a day (BID) | ORAL | Status: DC | PRN
  Filled 2021-01-13: qty 1

## 2021-01-13 MED ORDER — FENTANYL CITRATE (PF) 100 MCG/2ML IJ SOLN
INTRAMUSCULAR | Status: AC
  Filled 2021-01-13: qty 2

## 2021-01-13 MED ORDER — PANTOPRAZOLE SODIUM 40 MG IV SOLR: 40.0000 mg | Freq: Every day | INTRAVENOUS | Status: DC

## 2021-01-13 MED ORDER — ZOLPIDEM TARTRATE 5 MG PO TABS: 5.0000 mg | ORAL_TABLET | Freq: Every evening | ORAL | Status: DC | PRN

## 2021-01-13 MED ORDER — THROMBIN 5000 UNITS EX SOLR: OROMUCOSAL | Status: DC | PRN

## 2021-01-13 MED ORDER — HYDRALAZINE HCL 50 MG PO TABS
50.0000 mg | ORAL_TABLET | Freq: Three times a day (TID) | ORAL | Status: DC
  Administered 2021-01-13 – 2021-01-14 (×2): 50 mg via ORAL
  Filled 2021-01-13 (×5): qty 1

## 2021-01-13 MED ORDER — MECLIZINE HCL 25 MG PO TABS: 25.0000 mg | ORAL_TABLET | Freq: Two times a day (BID) | ORAL | Status: DC | PRN

## 2021-01-13 MED ORDER — OXYCODONE HCL 5 MG PO TABS: 5.0000 mg | ORAL_TABLET | Freq: Once | ORAL | Status: DC | PRN

## 2021-01-13 MED ORDER — POLYETHYLENE GLYCOL 3350 17 G PO PACK: 17.0000 g | PACK | Freq: Every day | ORAL | Status: DC | PRN

## 2021-01-13 MED ORDER — LIDOCAINE-EPINEPHRINE 1 %-1:100000 IJ SOLN
INTRAMUSCULAR | Status: AC
  Filled 2021-01-13: qty 1

## 2021-01-13 MED ORDER — ONDANSETRON HCL 4 MG/2ML IJ SOLN
INTRAMUSCULAR | Status: AC
  Filled 2021-01-13: qty 2

## 2021-01-13 MED ORDER — THROMBIN 20000 UNITS EX SOLR: CUTANEOUS | Status: DC | PRN

## 2021-01-13 MED ORDER — BISACODYL 10 MG RE SUPP
10.0000 mg | Freq: Every day | RECTAL | Status: DC | PRN
Start: 2021-01-13 — End: 2021-01-14

## 2021-01-13 MED ORDER — TRAMADOL HCL 50 MG PO TABS
50.0000 mg | ORAL_TABLET | Freq: Four times a day (QID) | ORAL | Status: DC | PRN
  Administered 2021-01-14: 100 mg via ORAL
  Filled 2021-01-13: qty 2

## 2021-01-13 MED ORDER — PROPOFOL 10 MG/ML IV BOLUS
INTRAVENOUS | Status: DC | PRN
  Administered 2021-01-13: 100 mg via INTRAVENOUS

## 2021-01-13 MED ORDER — SODIUM CHLORIDE 0.9 % IV SOLN: 250.0000 mL | INTRAVENOUS | Status: DC

## 2021-01-13 MED ORDER — SODIUM CHLORIDE 0.9% FLUSH
3.0000 mL | Freq: Two times a day (BID) | INTRAVENOUS | Status: DC
  Administered 2021-01-13: 3 mL via INTRAVENOUS

## 2021-01-13 MED ORDER — PHENOL 1.4 % MT LIQD: 1.0000 | OROMUCOSAL | Status: DC | PRN

## 2021-01-13 MED ORDER — HYDROCODONE-ACETAMINOPHEN 5-325 MG PO TABS: 2.0000 | ORAL_TABLET | ORAL | Status: DC | PRN

## 2021-01-13 MED ORDER — VANCOMYCIN HCL IN DEXTROSE 750-5 MG/150ML-% IV SOLN
750.0000 mg | Freq: Once | INTRAVENOUS | Status: AC
  Administered 2021-01-13: 750 mg via INTRAVENOUS
  Filled 2021-01-13: qty 150

## 2021-01-13 MED ORDER — ACETAMINOPHEN 325 MG PO TABS: 325.0000 mg | ORAL_TABLET | ORAL | Status: DC | PRN

## 2021-01-13 MED ORDER — ORAL CARE MOUTH RINSE: 15.0000 mL | Freq: Once | OROMUCOSAL | Status: AC

## 2021-01-13 MED ORDER — ASPIRIN EC 81 MG PO TBEC
81.0000 mg | DELAYED_RELEASE_TABLET | Freq: Every day | ORAL | Status: DC
  Administered 2021-01-14: 81 mg via ORAL
  Filled 2021-01-13: qty 1

## 2021-01-13 MED ORDER — ROCURONIUM BROMIDE 100 MG/10ML IV SOLN
INTRAVENOUS | Status: DC | PRN
  Administered 2021-01-13: 100 mg via INTRAVENOUS

## 2021-01-13 MED ORDER — SUFENTANIL CITRATE 50 MCG/ML IV SOLN
INTRAVENOUS | Status: DC | PRN
  Administered 2021-01-13: 10 ug via INTRAVENOUS
  Administered 2021-01-13: 20 ug via INTRAVENOUS
  Administered 2021-01-13: 10 ug via INTRAVENOUS

## 2021-01-13 MED ORDER — HYDRALAZINE HCL 10 MG PO TABS: 50.0000 mg | ORAL_TABLET | Freq: Three times a day (TID) | ORAL | Status: DC

## 2021-01-13 MED ORDER — OXYCODONE HCL 5 MG/5ML PO SOLN: 5.0000 mg | Freq: Once | ORAL | Status: DC | PRN

## 2021-01-13 MED ORDER — LACTATED RINGERS IV SOLN: INTRAVENOUS | Status: DC | PRN

## 2021-01-13 MED ORDER — DIAZEPAM 2 MG PO TABS: 2.0000 mg | ORAL_TABLET | Freq: Two times a day (BID) | ORAL | Status: DC | PRN

## 2021-01-13 MED ORDER — ROSUVASTATIN CALCIUM 5 MG PO TABS
10.0000 mg | ORAL_TABLET | Freq: Every day | ORAL | Status: DC
  Administered 2021-01-14: 10 mg via ORAL
  Filled 2021-01-13: qty 2

## 2021-01-13 MED ORDER — METHOCARBAMOL 500 MG PO TABS
500.0000 mg | ORAL_TABLET | Freq: Four times a day (QID) | ORAL | Status: DC | PRN
  Administered 2021-01-14: 500 mg via ORAL
  Filled 2021-01-13: qty 1

## 2021-01-13 MED ORDER — HYDROXYZINE HCL 50 MG/ML IM SOLN
50.0000 mg | Freq: Four times a day (QID) | INTRAMUSCULAR | Status: DC | PRN
  Administered 2021-01-13: 50 mg via INTRAMUSCULAR
  Filled 2021-01-13: qty 1

## 2021-01-13 MED ORDER — LACTATED RINGERS IV SOLN: INTRAVENOUS | Status: DC

## 2021-01-13 MED ORDER — SCOPOLAMINE 1 MG/3DAYS TD PT72
MEDICATED_PATCH | TRANSDERMAL | Status: DC | PRN
  Administered 2021-01-13: 1 via TRANSDERMAL

## 2021-01-13 MED ORDER — FAMOTIDINE 20 MG PO TABS
20.0000 mg | ORAL_TABLET | Freq: Two times a day (BID) | ORAL | Status: DC
  Administered 2021-01-14: 20 mg via ORAL
  Filled 2021-01-13 (×2): qty 1

## 2021-01-13 MED ORDER — LIDOCAINE-EPINEPHRINE 1 %-1:100000 IJ SOLN
INTRAMUSCULAR | Status: DC | PRN
  Administered 2021-01-13: 5 mL

## 2021-01-13 MED ORDER — BUPIVACAINE LIPOSOME 1.3 % IJ SUSP
INTRAMUSCULAR | Status: DC | PRN
  Administered 2021-01-13: 20 mL

## 2021-01-13 MED ORDER — MENTHOL 3 MG MT LOZG: 1.0000 | LOZENGE | OROMUCOSAL | Status: DC | PRN

## 2021-01-13 MED ORDER — ACETAMINOPHEN 160 MG/5ML PO SOLN: 325.0000 mg | ORAL | Status: DC | PRN

## 2021-01-13 MED ORDER — ALBUTEROL SULFATE (2.5 MG/3ML) 0.083% IN NEBU: 2.5000 mg | INHALATION_SOLUTION | RESPIRATORY_TRACT | Status: DC | PRN

## 2021-01-13 MED ORDER — METHOCARBAMOL 1000 MG/10ML IJ SOLN
500.0000 mg | Freq: Four times a day (QID) | INTRAVENOUS | Status: DC | PRN
  Filled 2021-01-13: qty 5

## 2021-01-13 MED ORDER — THROMBIN 20000 UNITS EX SOLR
CUTANEOUS | Status: AC
  Filled 2021-01-13: qty 20000

## 2021-01-13 MED ORDER — PANTOPRAZOLE SODIUM 40 MG PO TBEC
40.0000 mg | DELAYED_RELEASE_TABLET | Freq: Every day | ORAL | Status: DC
  Filled 2021-01-13: qty 1

## 2021-01-13 MED ORDER — DOCUSATE SODIUM 100 MG PO CAPS
100.0000 mg | ORAL_CAPSULE | Freq: Two times a day (BID) | ORAL | Status: DC
  Administered 2021-01-13 – 2021-01-14 (×2): 100 mg via ORAL
  Filled 2021-01-13 (×2): qty 1

## 2021-01-13 MED ORDER — DEXAMETHASONE SODIUM PHOSPHATE 10 MG/ML IJ SOLN
INTRAMUSCULAR | Status: DC | PRN
  Administered 2021-01-13: 10 mg via INTRAVENOUS

## 2021-01-13 MED ORDER — THROMBIN 5000 UNITS EX SOLR
CUTANEOUS | Status: AC
  Filled 2021-01-13: qty 5000

## 2021-01-13 MED ORDER — CHLORHEXIDINE GLUCONATE CLOTH 2 % EX PADS
6.0000 | MEDICATED_PAD | Freq: Every day | CUTANEOUS | Status: DC
  Administered 2021-01-14: 6 via TOPICAL

## 2021-01-13 MED ORDER — MIDAZOLAM HCL 2 MG/2ML IJ SOLN
INTRAMUSCULAR | Status: DC | PRN
  Administered 2021-01-13: 2 mg via INTRAVENOUS

## 2021-01-13 MED ORDER — ONDANSETRON HCL 4 MG/2ML IJ SOLN: 4.0000 mg | Freq: Four times a day (QID) | INTRAMUSCULAR | Status: DC | PRN

## 2021-01-13 MED ORDER — ACETAMINOPHEN 650 MG RE SUPP
650.0000 mg | RECTAL | Status: DC | PRN
Start: 2021-01-13 — End: 2021-01-14

## 2021-01-13 MED ORDER — HYDROMORPHONE HCL 1 MG/ML IJ SOLN
0.5000 mg | INTRAMUSCULAR | Status: DC | PRN
  Administered 2021-01-13 (×2): 0.5 mg via INTRAVENOUS
  Filled 2021-01-13 (×2): qty 0.5

## 2021-01-13 MED ORDER — MEPERIDINE HCL 25 MG/ML IJ SOLN: 6.2500 mg | INTRAMUSCULAR | Status: DC | PRN

## 2021-01-13 MED ORDER — ONDANSETRON HCL 4 MG PO TABS: 4.0000 mg | ORAL_TABLET | Freq: Four times a day (QID) | ORAL | Status: DC | PRN

## 2021-01-13 MED ORDER — KCL IN DEXTROSE-NACL 20-5-0.45 MEQ/L-%-% IV SOLN
INTRAVENOUS | Status: DC
  Filled 2021-01-13: qty 1000

## 2021-01-13 MED ORDER — BUPIVACAINE HCL (PF) 0.5 % IJ SOLN
INTRAMUSCULAR | Status: DC | PRN
  Administered 2021-01-13: 30 mL

## 2021-01-13 SURGICAL SUPPLY — 62 items
BASKET BONE COLLECTION (BASKET) ×2 IMPLANT
BUR MATCHSTICK NEURO 3.0 LAGG (BURR) ×2 IMPLANT
BUR PRECISION FLUTE 5.0 (BURR) ×2 IMPLANT
CANISTER SUCT 3000ML PPV (MISCELLANEOUS) ×2 IMPLANT
CARTRIDGE OIL MAESTRO DRILL (MISCELLANEOUS) ×1 IMPLANT
CNTNR URN SCR LID CUP LEK RST (MISCELLANEOUS) ×1 IMPLANT
CONT SPEC 4OZ STRL OR WHT (MISCELLANEOUS) ×1
COVER BACK TABLE 24X17X13 BIG (DRAPES) ×2 IMPLANT
COVER BACK TABLE 60X90IN (DRAPES) ×2 IMPLANT
DERMABOND ADVANCED (GAUZE/BANDAGES/DRESSINGS) ×1
DERMABOND ADVANCED .7 DNX12 (GAUZE/BANDAGES/DRESSINGS) ×1 IMPLANT
DIFFUSER DRILL AIR PNEUMATIC (MISCELLANEOUS) ×2 IMPLANT
DRAPE C-ARM 42X72 X-RAY (DRAPES) ×2 IMPLANT
DRAPE C-ARMOR (DRAPES) ×2 IMPLANT
DRAPE LAPAROTOMY 100X72X124 (DRAPES) ×2 IMPLANT
DRAPE SURG 17X23 STRL (DRAPES) ×2 IMPLANT
DRSG OPSITE POSTOP 4X6 (GAUZE/BANDAGES/DRESSINGS) ×2 IMPLANT
DURAPREP 26ML APPLICATOR (WOUND CARE) ×2 IMPLANT
ELECT REM PT RETURN 9FT ADLT (ELECTROSURGICAL) ×2
ELECTRODE REM PT RTRN 9FT ADLT (ELECTROSURGICAL) ×1 IMPLANT
GAUZE 4X4 16PLY RFD (DISPOSABLE) IMPLANT
GAUZE SPONGE 4X4 12PLY STRL (GAUZE/BANDAGES/DRESSINGS) IMPLANT
GLOVE BIO SURGEON STRL SZ8 (GLOVE) ×6 IMPLANT
GLOVE BIOGEL PI IND STRL 8.5 (GLOVE) ×3 IMPLANT
GLOVE BIOGEL PI INDICATOR 8.5 (GLOVE) ×3
GLOVE ECLIPSE 8.0 STRL XLNG CF (GLOVE) ×6 IMPLANT
GLOVE SRG 8 PF TXTR STRL LF DI (GLOVE) ×4 IMPLANT
GLOVE SURG UNDER POLY LF SZ8 (GLOVE) ×4
GOWN STRL REUS W/ TWL LRG LVL3 (GOWN DISPOSABLE) IMPLANT
GOWN STRL REUS W/ TWL XL LVL3 (GOWN DISPOSABLE) ×2 IMPLANT
GOWN STRL REUS W/TWL 2XL LVL3 (GOWN DISPOSABLE) ×8 IMPLANT
GOWN STRL REUS W/TWL LRG LVL3 (GOWN DISPOSABLE)
GOWN STRL REUS W/TWL XL LVL3 (GOWN DISPOSABLE) ×2
HEMOSTAT POWDER KIT SURGIFOAM (HEMOSTASIS) ×2 IMPLANT
IMPL TLX20 9X11X26 20D (Cage) ×1 IMPLANT
KIT BASIN OR (CUSTOM PROCEDURE TRAY) ×2 IMPLANT
KIT INFUSE XX SMALL 0.7CC (Orthopedic Implant) ×2 IMPLANT
KIT POSITION SURG JACKSON T1 (MISCELLANEOUS) ×2 IMPLANT
KIT TURNOVER KIT B (KITS) ×2 IMPLANT
NEEDLE HYPO 25X1 1.5 SAFETY (NEEDLE) ×2 IMPLANT
NEEDLE SPNL 18GX3.5 QUINCKE PK (NEEDLE) ×2 IMPLANT
NS IRRIG 1000ML POUR BTL (IV SOLUTION) ×2 IMPLANT
OIL CARTRIDGE MAESTRO DRILL (MISCELLANEOUS) ×2
PACK LAMINECTOMY NEURO (CUSTOM PROCEDURE TRAY) ×2 IMPLANT
PATTIES SURGICAL .5 X.5 (GAUZE/BANDAGES/DRESSINGS) IMPLANT
PATTIES SURGICAL .5 X1 (DISPOSABLE) IMPLANT
PATTIES SURGICAL 1X1 (DISPOSABLE) IMPLANT
ROD RELINE-O LORD 5.5X35MM (Rod) ×2 IMPLANT
ROD RELINE-O LORD 5.5X40 (Rod) ×2 IMPLANT
SCREW LOCK RELINE 5.5 TULIP (Screw) ×8 IMPLANT
SCREW RELINE-O POLY 6.5X40 (Screw) ×8 IMPLANT
SPONGE LAP 4X18 RFD (DISPOSABLE) IMPLANT
SPONGE SURGIFOAM ABS GEL 100 (HEMOSTASIS) ×2 IMPLANT
SUT VIC AB 1 CT1 18XBRD ANBCTR (SUTURE) ×1 IMPLANT
SUT VIC AB 1 CT1 8-18 (SUTURE) ×1
SUT VIC AB 2-0 CT1 18 (SUTURE) ×2 IMPLANT
SUT VIC AB 3-0 SH 8-18 (SUTURE) ×2 IMPLANT
TLX20 IMPLANT 9X11X26 20D (Cage) ×2 IMPLANT
TOWEL GREEN STERILE (TOWEL DISPOSABLE) ×2 IMPLANT
TOWEL GREEN STERILE FF (TOWEL DISPOSABLE) ×2 IMPLANT
TRAY FOLEY MTR SLVR 16FR STAT (SET/KITS/TRAYS/PACK) ×2 IMPLANT
WATER STERILE IRR 1000ML POUR (IV SOLUTION) ×2 IMPLANT

## 2021-01-13 NOTE — Transfer of Care (Signed)
Immediate Anesthesia Transfer of Care Note  [name] [facility]  Procedure(s) Performed: Right Lumbar Four-Five Transforaminal lumbar interbody fusion (Right Spine Lumbar)  Patient Location: PACU  Anesthesia Type:General  Level of Consciousness: awake and alert   Airway & Oxygen Therapy: Patient Spontanous Breathing and Patient connected to face mask oxygen  Post-op Assessment: Report given to RN and Post -op Vital signs reviewed and stable  Post vital signs: Reviewed  Last Vitals:  Vitals Value Taken Time  BP 162/55 01/13/21 1516  Temp    Pulse 70 01/13/21 1520  Resp 23 01/13/21 1520  SpO2 100 % 01/13/21 1520  Vitals shown include unvalidated device data.  Last Pain:  Vitals:   01/13/21 0944  TempSrc:   PainSc: 5          Complications: No complications documented.

## 2021-01-13 NOTE — Brief Op Note (Signed)
01/13/2021  3:11 PM  PATIENT:  Jessica Armstrong  75 y.o. 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, herniated lumbar disc, radiculopathy, lumbago  POST-OPERATIVE DIAGNOSIS:   Spondylolisthesis L 45 with stenosis, herniated lumbar disc, radiculopathy, lumbago  PROCEDURE:  Procedure(s) with comments: Right Lumbar Four-Five Transforaminal lumbar interbody fusion (Right) - with expandable titanium cage, autograft, pedicle screw fixation, posterolateral arthrodesis  SURGEON:  Surgeon(s) and Role:    Erline Levine, MD - Primary  PHYSICIAN ASSISTANT:   ASSISTANTS: [place], RN   ANESTHESIA:   general  EBL:  100 mL   BLOOD ADMINISTERED:none  DRAINS: none   LOCAL MEDICATIONS USED:  MARCAINE    and LIDOCAINE   SPECIMEN:  No Specimen  DISPOSITION OF SPECIMEN:  N/A  COUNTS:  YES  TOURNIQUET:  * No tourniquets in log *   DICTATION: Patient is 75 year old woman with mobile spondylolisthesis of L4 on L5 with lumbar stenosis and a herniated lumbar disc. She has a right L5 radiculopathy. It was elected to take her to surgery for decompression and fusion at this level.   Procedure: Patient was placed in a prone position on the [place] table after smooth and uncomplicated induction of general endotracheal anesthesia. Her low back was prepped and draped in usual sterile fashion with betadine scrub and DuraPrep after marking appropriate bony anatomy with C arm. Area of incision was infiltrated with local lidocaine. Incision was made to the lumbodorsal fascia was incised and exposure was performed of the L4 through L5 spinous processes laminae facet joint and transverse processes. Intraoperative x-ray was obtained which confirmed correct orientation. A total right hemi- laminectomy of L4 was performed with disarticulation of the facet joints at this level and thorough decompression was performed of both L4 and L5 nerve roots along with the common dural tube on the right.  This decompression was more involved than would be typical of that performed for PLIF alone and included painstaking dissection of adherent ligament compressing the thecal sac and wide decompression of all neural elements. A thorough discectomy was initially performed on the right with preparation of the endplates for grafting a trial spacer was placed this level. Lateral recess and L 5 nerve roots were decompressed with removal of herniated disc material.  Bone autograft was packed within the interspace along with extra extra small BMP kit and bone autograft. An expandable titanium TLIF cage was packed with BMP and autograft and tamped into position, then expanded (9 x 11 x 26 mm x 15 degrees) appropriately along with 10 cc of morselized bone autograft and BMP. The posterolateral region was extensively decorticated and pedicle probes were placed at L4 and L5 bilaterally. Intraoperative fluoroscopy confirmed correct orientationin the AP and lateral plane. 40 x 6.5 mm pedicle screws were placed at L5 bilaterally and 40 x 6.5 mm screws placed at L4 bilaterally final x-rays demonstrated well-positioned interbody grafts and pedicle screw fixation. A 35 mm lordotic rod was placed on the right and a 40 mm rod was placed on the left locked down in situ and the posterolateral region was packed with the remaining BMP and 15 cc bone autograft on the decorticated facet joint complex at L 45 on the left. The wound was irrigated andlong-acting Marcaine was injected in the deep musculature. Fascia was closed with 1 Vicryl sutures skin edges were reapproximated 2 and 3-0 Vicryl sutures. The wound is dressed with Dermabond and an occlusive dressing.  The patient was extubated in the operating room and  taken to recovery in stable satisfactory condition. She tolerated the operation well counts were correct at the end of the case.   PLAN OF CARE: Admit to inpatient   PATIENT DISPOSITION:  PACU - hemodynamically stable.   Delay  start of Pharmacological VTE agent (>24hrs) due to surgical blood loss or risk of bleeding: yes

## 2021-01-13 NOTE — Anesthesia Procedure Notes (Signed)
Procedure Name: Intubation Date/Time: 01/13/2021 12:25 PM Performed by: Claris Che, CRNA Pre-anesthesia Checklist: Patient identified, Emergency Drugs available, Suction available, Patient being monitored and Timeout performed Patient Re-evaluated:Patient Re-evaluated prior to induction Oxygen Delivery Method: Circle system utilized Preoxygenation: Pre-oxygenation with 100% oxygen Induction Type: IV induction Ventilation: Mask ventilation without difficulty Laryngoscope Size: Mac and 3 Grade View: Grade I Tube type: Oral Tube size: 7.0 mm Number of attempts: 1 Airway Equipment and Method: Stylet Placement Confirmation: ETT inserted through vocal cords under direct vision and positive ETCO2 Dental Injury: Teeth and Oropharynx as per pre-operative assessment  Comments: Intubation by Jettie Pagan SRNA

## 2021-01-13 NOTE — Progress Notes (Signed)
Pharmacy Antibiotic Note  Jessica Armstrong is a 75 y.o. female admitted on 01/13/2021 for lumbar surgery with surgical prophylaxis.  Pharmacy has been consulted for Vancomycin dosing x1 post operatively. No drain in place.   Pre-op 1000 mg Vancomycin given at 1000 AM.  WBC within normal limits. Afebrile. SCr 0.99.   Plan: Vancomycin 750 mg IV every 12 hours x1 dose at 2200 PM.  No further vancomycin needed as no drain in place.  Pharmacy will sign off. Please re-consult if needed.   Height: 5\' 7"  (170.2 cm) Weight: 60.3 kg (133 lb) IBW/kg (Calculated) : 61.6  Temp (24hrs), Avg:97.4 F (36.3 C), Min:97 F (36.1 C), Max:98 F (36.7 C)  Recent Labs  Lab 01/13/21 0959  WBC 6.9  CREATININE 0.99    Estimated Creatinine Clearance: 46 mL/min (by C-G formula based on SCr of 0.99 mg/dL).    Allergies  Allergen Reactions  . Cholestyramine Nausea And Vomiting  . Ciprofloxacin Nausea And Vomiting  . Codeine Sulfate Nausea And Vomiting  . Keflex [Cephalexin] Nausea And Vomiting  . Macrobid [Nitrofurantoin Macrocrystal] Nausea And Vomiting  . Sulfa Antibiotics Nausea And Vomiting and Rash    Thank you for allowing pharmacy to be a part of this patient's care.  Sloan Leiter, PharmD, BCPS, BCCCP Clinical Pharmacist Please refer to [facility] for [place] numbers 01/13/2021 5:12 PM

## 2021-01-13 NOTE — Op Note (Signed)
01/13/2021  3:11 PM  PATIENT:  Jessica Armstrong  75 y.o. 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, herniated lumbar disc, radiculopathy, lumbago  POST-OPERATIVE DIAGNOSIS:   Spondylolisthesis L 45 with stenosis, herniated lumbar disc, radiculopathy, lumbago  PROCEDURE:  Procedure(s) with comments: Right Lumbar Four-Five Transforaminal lumbar interbody fusion (Right) - with expandable titanium cage, autograft, pedicle screw fixation, posterolateral arthrodesis  SURGEON:  Surgeon(s) and Role:    Erline Levine, MD - Primary  PHYSICIAN ASSISTANT:   ASSISTANTS: Poteat, RN   ANESTHESIA:   general  EBL:  100 mL   BLOOD ADMINISTERED:none  DRAINS: none   LOCAL MEDICATIONS USED:  MARCAINE    and LIDOCAINE   SPECIMEN:  No Specimen  DISPOSITION OF SPECIMEN:  N/A  COUNTS:  YES  TOURNIQUET:  * No tourniquets in log *   DICTATION: Patient is 75 year old woman with mobile spondylolisthesis of L4 on L5 with lumbar stenosis and a herniated lumbar disc. She has a right L5 radiculopathy. It was elected to take her to surgery for decompression and fusion at this level.   Procedure: Patient was placed in a prone position on the [place] table after smooth and uncomplicated induction of general endotracheal anesthesia. Her low back was prepped and draped in usual sterile fashion with betadine scrub and DuraPrep after marking appropriate bony anatomy with C arm. Area of incision was infiltrated with local lidocaine. Incision was made to the lumbodorsal fascia was incised and exposure was performed of the L4 through L5 spinous processes laminae facet joint and transverse processes. Intraoperative x-ray was obtained which confirmed correct orientation. A total right hemi- laminectomy of L4 was performed with disarticulation of the facet joints at this level and thorough decompression was performed of both L4 and L5 nerve roots along with the common dural tube on the right.  This decompression was more involved than would be typical of that performed for PLIF alone and included painstaking dissection of adherent ligament compressing the thecal sac and wide decompression of all neural elements. A thorough discectomy was initially performed on the right with preparation of the endplates for grafting a trial spacer was placed this level. Lateral recess and L 5 nerve roots were decompressed with removal of herniated disc material.  Bone autograft was packed within the interspace along with extra extra small BMP kit and bone autograft. An expandable titanium TLIF cage was packed with BMP and autograft and tamped into position, then expanded (9 x 11 x 26 mm x 15 degrees) appropriately along with 10 cc of morselized bone autograft and BMP. The posterolateral region was extensively decorticated and pedicle probes were placed at L4 and L5 bilaterally. Intraoperative fluoroscopy confirmed correct orientationin the AP and lateral plane. 40 x 6.5 mm pedicle screws were placed at L5 bilaterally and 40 x 6.5 mm screws placed at L4 bilaterally final x-rays demonstrated well-positioned interbody grafts and pedicle screw fixation. A 35 mm lordotic rod was placed on the right and a 40 mm rod was placed on the left locked down in situ and the posterolateral region was packed with the remaining BMP and 15 cc bone autograft on the decorticated facet joint complex at L 45 on the left. The wound was irrigated andlong-acting Marcaine was injected in the deep musculature. Fascia was closed with 1 Vicryl sutures skin edges were reapproximated 2 and 3-0 Vicryl sutures. The wound is dressed with Dermabond and an occlusive dressing.  The patient was extubated in the operating room and  taken to recovery in stable satisfactory condition. She tolerated the operation well counts were correct at the end of the case.   PLAN OF CARE: Admit to inpatient   PATIENT DISPOSITION:  PACU - hemodynamically stable.   Delay  start of Pharmacological VTE agent (>24hrs) due to surgical blood loss or risk of bleeding: yes

## 2021-01-13 NOTE — Progress Notes (Signed)
Orthopedic Tech Progress Note Patient Details:  Jessica Armstrong Jessica Armstrong 05-29-1946 [id number] Patient has brace Patient ID: Jessica Armstrong, female   DOB: [birth year], 75 y.o.   MRN: [id number]   Jessica Armstrong 01/13/2021, 5:46 PM

## 2021-01-13 NOTE — Interval H&P Note (Signed)
History and Physical Interval Note:  01/13/2021 12:10 PM  Jessica Armstrong  has presented today for surgery, with the diagnosis of Spondylolisthesis.  The various methods of treatment have been discussed with the patient and family. After consideration of risks, benefits and other options for treatment, the patient has consented to  Procedure(s) with comments: Right Lumbar 4-5 Transforaminal lumbar interbody fusion (Right) - 3C/RM 20 as a surgical intervention.  The patient's history has been reviewed, patient examined, no change in status, stable for surgery.  I have reviewed the patient's chart and labs.  Questions were answered to the patient's satisfaction.     [name]

## 2021-01-13 NOTE — Progress Notes (Signed)
Awake, alert, conversant.  MAEW with full power bilateral PF/DF/EHL.  Patient is doing well.  Back is sore.  Denies leg pain or numbness.

## 2021-01-14 ENCOUNTER — Encounter (HOSPITAL_COMMUNITY): Payer: Self-pay | Admitting: Neurosurgery

## 2021-01-14 MED ORDER — METHOCARBAMOL 500 MG PO TABS: 500.0000 mg | ORAL_TABLET | Freq: Four times a day (QID) | ORAL | 0 refills | Status: DC | PRN

## 2021-01-14 MED ORDER — HYDROCODONE-ACETAMINOPHEN 5-325 MG PO TABS: 1.0000 | ORAL_TABLET | Freq: Four times a day (QID) | ORAL | 0 refills | Status: DC | PRN

## 2021-01-14 NOTE — Anesthesia Postprocedure Evaluation (Signed)
Anesthesia Post Note  [name] [facility]  Procedure(s) Performed: Right Lumbar Four-Five Transforaminal lumbar interbody fusion (Right Spine Lumbar)     Patient location during evaluation: PACU Anesthesia Type: General Level of consciousness: awake and alert Pain management: pain level controlled Vital Signs Assessment: post-procedure vital signs reviewed and stable Respiratory status: spontaneous breathing, nonlabored ventilation and respiratory function stable Cardiovascular status: blood pressure returned to baseline and stable Postop Assessment: no apparent nausea or vomiting Anesthetic complications: no   No complications documented.  Last Vitals:  Vitals:   01/13/21 2315 01/14/21 0418  BP: (!) 144/57 (!) 123/51  Pulse: 79 91  Resp: 18 18  Temp: 36.6 C 36.7 C  SpO2: 95% 98%    Last Pain:  Vitals:   01/14/21 0418  TempSrc: Oral  PainSc:                  Lidia Collum

## 2021-01-14 NOTE — Plan of Care (Signed)
Patient alert and oriented, Voiding adequately. No s/s of infection noted, and no c/o pain. Patient discharge home per order. Patient and spouse stated understanding of discharge instructions given. Patient has an appointment in 2 weeks.

## 2021-01-14 NOTE — Evaluation (Signed)
Occupational Therapy Evaluation Patient Details Name: Jessica Armstrong MRN: [id number] DOB: 25-Jan-1946 Today's Date: 01/14/2021    History of Present Illness Pt is a 75 y/o female who presents s/p L4-L5 TLIF on 01/13/2021. PMH significant for HF,   Clinical Impression   Patient admitted for the diagnosis and procedure above.  PTA she was limited with ambulation distances, but was generally independent with all mobility and self care.  Her spouse is available 24/7 to assist as needed.  Pain is the limiting factor.  Currently she is needing up to [facility] for mobility and Min A for lower body ADL.  It is expected as her pain eases, she should become more independent.  No further OT needs, she is looking to discharge home.      Follow Up Recommendations  No OT follow up    Equipment Recommendations  None recommended by OT    Recommendations for Other Services       Precautions / Restrictions Precautions Precautions: Back Restrictions Weight Bearing Restrictions: No      Mobility Bed Mobility Overal bed mobility: Needs Assistance Bed Mobility: Sit to Supine       Sit to supine: Min assist        Transfers Overall transfer level: Needs assistance   Transfers: Sit to/from Stand;Stand Pivot Transfers Sit to Stand: Min guard Stand pivot transfers: Min guard            Balance Overall balance assessment: Mild deficits observed, not formally tested                                         ADL either performed or assessed with clinical judgement   ADL Overall ADL's : Needs assistance/impaired                     Lower Body Dressing: Minimal assistance;Sit to/from stand   Toilet Transfer: Tour manager Toilet;RW   Toileting- Water quality scientist and Hygiene: Supervision/safety;Sit to/from stand       Functional mobility during ADLs: Min guard;Rolling walker       Vision Baseline Vision/History: No visual deficits Patient Visual  Report: No change from baseline       Perception     Praxis      Pertinent Vitals/Pain Pain Assessment: Faces Faces Pain Scale: Hurts whole lot Pain Location: incision site Pain Descriptors / Indicators: Sharp;Shooting;Spasm;Operative site guarding Pain Intervention(s): Monitored during session;Premedicated before session     Hand Dominance Right   Extremity/Trunk Assessment Upper Extremity Assessment Upper Extremity Assessment: Overall WFL for tasks assessed   Lower Extremity Assessment Lower Extremity Assessment: Defer to PT evaluation   Cervical / Trunk Assessment Cervical / Trunk Assessment: Normal   Communication Communication Communication: No difficulties   Cognition Arousal/Alertness: Awake/alert Behavior During Therapy: Restless Overall Cognitive Status: Within Functional Limits for tasks assessed                                                      Home Living Family/patient expects to be discharged to:: Private residence Living Arrangements: Spouse/significant other Available Help at Discharge: Family;Available 24 hours/day Type of Home: House Home Access: Stairs to enter [employer] of Steps: 2   Home Layout: One  level     Bathroom Shower/Tub: Occupational psychologist: Handicapped height     Home Equipment: Environmental consultant - 2 wheels;Shower seat;Adaptive equipment Adaptive Equipment: Reacher;Long-handled shoe horn;Long-handled sponge        Prior Functioning/Environment Level of Independence: Independent                 OT Problem List: Pain      OT Treatment/Interventions:      OT Goals(Current goals can be found in the care plan section) Acute Rehab OT Goals Patient Stated Goal: Hope this pain lessens OT Goal Formulation: With patient Time For Goal Achievement: 01/14/21 Potential to Achieve Goals: Good  OT Frequency:     Barriers to D/C:  none noted          Co-evaluation               AM-PAC OT "6 Clicks" Daily Activity     Outcome Measure Help from another person eating meals?: None Help from another person taking care of personal grooming?: None Help from another person toileting, which includes using toliet, bedpan, or urinal?: None Help from another person bathing (including washing, rinsing, drying)?: A Little Help from another person to put on and taking off regular upper body clothing?: None Help from another person to put on and taking off regular lower body clothing?: A Little 6 Click Score: 22   End of Session Equipment Utilized During Treatment: Rolling walker Nurse Communication: Mobility status  Activity Tolerance: Patient limited by pain Patient left: in bed;with call bell/phone within reach  OT Visit Diagnosis: Pain                Time: 0826-0850 OT Time Calculation (min): 24 min Charges:  OT General Charges $OT Visit: 1 Visit OT Evaluation $OT Eval Moderate Complexity: 1 Mod OT Treatments $Self Care/Home Management : 8-22 mins  01/14/2021  Rich, OTR/L  Acute Rehabilitation Services  Office:  [phone]   Metta Clines 01/14/2021, 8:56 AM

## 2021-01-14 NOTE — Evaluation (Signed)
Physical Therapy Evaluation Patient Details Name: Jessica Armstrong MRN: [id number] DOB: 1946-09-20 Today's Date: 01/14/2021   History of Present Illness  Pt is a 75 y/o female who presents s/p L4-L5 TLIF on 01/13/2021. PMH significant for HF,  Clinical Impression  Pt admitted with above diagnosis. At the time of PT eval, pt was able to demonstrate transfers and ambulation with gross min guard assist to supervision for safety and RW for support. Pt was educated on precautions, brace application/wearing schedule, appropriate activity progression, and car transfer. Pt currently with functional limitations due to the deficits listed below (see PT Problem List). Pt will benefit from skilled PT to increase their independence and safety with mobility to allow discharge to the venue listed below.      Follow Up Recommendations No PT follow up;Supervision for mobility/OOB    Equipment Recommendations  Rolling walker with 5" wheels (If pt does not have one)    Recommendations for Other Services       Precautions / Restrictions Precautions Precautions: Back Precaution Booklet Issued: Yes (comment) Precaution Comments: Reviewed handout and pt was cued for precautions during functional mobility. Restrictions Weight Bearing Restrictions: No      Mobility  Bed Mobility Overal bed mobility: Needs Assistance Bed Mobility: Sit to Supine Rolling: Modified independent (Device/Increase time) Sidelying to sit: Supervision     General bed mobility comments: Light supervision for safety and cues for proper log roll technique.    Transfers Overall transfer level: Needs assistance Equipment used: Rolling walker (2 wheeled) Transfers: Sit to/from [facility] Sit to Stand: Min guard Stand pivot transfers: Min guard       General transfer comment: Close guard as pt mildly unsteady upon rise to standing. VC's for hand placement on seated surface for  safety.  Ambulation/Gait Ambulation/Gait assistance: Min guard;Supervision Gait Distance (Feet): 250 Feet Assistive device: Rolling walker (2 wheeled) Gait Pattern/deviations: Step-through pattern;Decreased stride length;Trunk flexed;Narrow base of support Gait velocity: Decreased Gait velocity interpretation: <1.31 ft/sec, indicative of household ambulator General Gait Details: VC's for improved posture, closer walker proximity, and forward gaze. No assist required however pt initially appeared very guarded and mildly unsteady so min guard assist provided, progressing to supervision for safety by end of gait training.  Stairs Stairs: Yes Stairs assistance: Min assist Stair Management: One rail Left;Step to pattern;Forwards Number of Stairs: 2 General stair comments: VC's for sequencing on stairs. Min assist for power up and to control descent down.  Wheelchair Mobility    Modified Rankin (Stroke Patients Only)       Balance Overall balance assessment: Mild deficits observed, not formally tested Sitting-balance support: Feet supported;No upper extremity supported Sitting balance-Leahy Scale: Fair     Standing balance support: No upper extremity supported;During functional activity Standing balance-Leahy Scale: Poor Standing balance comment: Required UE support for dynamic activity.                             Pertinent Vitals/Pain Pain Assessment: Faces Pain Score: 5  Faces Pain Scale: Hurts whole lot Pain Location: incision site Pain Descriptors / Indicators: Sharp;Shooting;Spasm;Operative site guarding Pain Intervention(s): Monitored during session;Premedicated before session    [place] expects to be discharged to:: Private residence Living Arrangements: Spouse/significant other Available Help at Discharge: Family;Available 24 hours/day Type of Home: House Home Access: Stairs to enter Entrance Stairs-Rails: Left Entrance  Stairs-Number of Steps: 2 Home Layout: One level Home Equipment: Walker - 2 wheels;Shower  seat;Adaptive equipment      Prior Function Level of Independence: Independent               Hand Dominance   Dominant Hand: Right    Extremity/Trunk Assessment   Upper Extremity Assessment Upper Extremity Assessment: Overall WFL for tasks assessed    Lower Extremity Assessment Lower Extremity Assessment: Defer to PT evaluation    Cervical / Trunk Assessment Cervical / Trunk Assessment: Normal Cervical / Trunk Exceptions: s/p surgery  Communication   Communication: No difficulties  Cognition Arousal/Alertness: Awake/alert Behavior During Therapy: Restless Overall Cognitive Status: Within Functional Limits for tasks assessed                                        General Comments      Exercises     Assessment/Plan    PT Assessment Patient needs continued PT services  PT Problem List Decreased strength;Decreased activity tolerance;Decreased balance;Decreased mobility;Decreased knowledge of use of DME;Decreased safety awareness;Decreased knowledge of precautions;Pain       PT Treatment Interventions DME instruction;Gait training;Stair training;Functional mobility training;Therapeutic activities;Therapeutic exercise;Neuromuscular re-education;Patient/family education    PT Goals (Current goals can be found in the Care Plan section)  Acute Rehab PT Goals Patient Stated Goal: Hope this pain lessens PT Goal Formulation: With patient Time For Goal Achievement: 01/21/21 Potential to Achieve Goals: Good    Frequency Min 5X/week   Barriers to discharge        Co-evaluation               AM-PAC PT "6 Clicks" Mobility  Outcome Measure Help needed turning from your back to your side while in a flat bed without using bedrails?: None Help needed moving from lying on your back to sitting on the side of a flat bed without using bedrails?: A Little Help  needed moving to and from a bed to a chair (including a wheelchair)?: A Little Help needed standing up from a chair using your arms (e.g., wheelchair or bedside chair)?: A Little Help needed to walk in hospital room?: A Little Help needed climbing 3-5 steps with a railing? : A Little 6 Click Score: 19    End of Session Equipment Utilized During Treatment: Gait belt Activity Tolerance: Patient tolerated treatment well Patient left: in bed (EOB with OT present) Nurse Communication: Mobility status PT Visit Diagnosis: Unsteadiness on feet (R26.81);Pain Pain - Right/Left: Left Pain - part of body:  (back)    Time: 0802-0820 PT Time Calculation (min) (ACUTE ONLY): 18 min   Charges:   PT Evaluation $PT Eval Low Complexity: 1 Low          Rolinda Roan, PT, DPT Acute Rehabilitation Services Pager: [phone] Office: [phone]   Thelma Comp 01/14/2021, 8:58 AM

## 2021-01-14 NOTE — Discharge Instructions (Signed)
Wound Care REMOVE OUTER DRESSING IN 2 DAYS Leave incision open to air. You may shower. Do not scrub directly on incision.  Do not put any creams, lotions, or ointments on incision. Activity Walk each and every day, increasing distance each day. No lifting greater than 5 lbs.  Avoid bending, arching, and twisting. No driving for 2 weeks; may ride as a passenger locally. If provided with back brace, wear when out of bed.  It is not necessary to wear in bed. Diet Resume your normal diet.  Return to Work Will be discussed at you follow up appointment. Call Your Doctor If Any of These Occur Redness, drainage, or swelling at the wound.  Temperature greater than 101 degrees. Severe pain not relieved by pain medication. Incision starts to come apart. Follow Up Appt Call today for appointment in 2 weeks ([phone]) or for problems.  If you have any hardware placed in your spine, you will need an x-ray before your appointment.

## 2021-01-14 NOTE — Progress Notes (Signed)
Subjective: Patient reports that she is doing well. She has ambulated multiple times and feels as though she has improved significantly from her preoperative status. She does have some complaints of intermittent "stabbing type pain" in her right side that coincides with activity.   Objective: Vital signs in last 24 hours: Temp:  [97 F (36.1 C)-98.7 F (37.1 C)] 98.7 F (37.1 C) (04/08 0734) Pulse Rate:  [63-91] 80 (04/08 0734) Resp:  [12-20] 16 (04/08 0734) BP: (121-166)/(42-83) 121/44 (04/08 0734) SpO2:  [94 %-100 %] 100 % (04/08 0734) Weight:  [60.3 kg] 60.3 kg (04/07 0905)  Intake/Output from previous day: 04/07 0701 - 04/08 0700 In: 1550 [I.V.:1550] Out: 560 [Urine:460; Blood:100] Intake/Output this shift: No intake/output data recorded.  Physical Exam: Patient is awake, A/O X 4, conversant, and in good spirits. Speech is fluent and appropriate. Doing well. MAEW with good strength that is symmetric bilaterally. Dressing is clean dry intact. Incision is well approximated with no drainage, erythema, or edema.    Lab Results: Recent Labs    01/13/21 0959  WBC 6.9  HGB 11.7*  HCT 34.7*  PLT 257   BMET Recent Labs    01/13/21 0959  NA 138  K 3.9  CL 108  CO2 23  GLUCOSE 124*  BUN 17  CREATININE 0.99  CALCIUM 9.4    Studies/Results: DG Lumbar Spine 2-3 Views  Result Date: 01/13/2021 CLINICAL DATA:  L4-L5 interbody fusion. EXAM: DG C-ARM 1-60 MIN; LUMBAR SPINE - 2-3 VIEW FLUOROSCOPY TIME:  Fluoroscopy Time:  34 seconds. Radiation Exposure Index (if provided by the fluoroscopic device): 15.58 mGy. Number of Acquired Spot Images: 2. COMPARISON:  MRI lumbar spine October 16, 2020. FINDINGS: Two C-arm fluoroscopic images were obtained intraoperatively and submitted for post operative interpretation. These images demonstrate bilateral pedicle screws at L4 and L5. Intervening interbody spacer at L4-L5. Minimal anterolisthesis of L4 on L5, similar to prior. Please see the  performing provider's procedural report for further detail. IMPRESSION: Intraoperative fluoroscopy, as detailed above. Electronically Signed   By: Margaretha Sheffield MD   On: 01/13/2021 15:58   DG C-Arm 1-60 Min  Result Date: 01/13/2021 CLINICAL DATA:  L4-L5 interbody fusion. EXAM: DG C-ARM 1-60 MIN; LUMBAR SPINE - 2-3 VIEW FLUOROSCOPY TIME:  Fluoroscopy Time:  34 seconds. Radiation Exposure Index (if provided by the fluoroscopic device): 15.58 mGy. Number of Acquired Spot Images: 2. COMPARISON:  MRI lumbar spine October 16, 2020. FINDINGS: Two C-arm fluoroscopic images were obtained intraoperatively and submitted for post operative interpretation. These images demonstrate bilateral pedicle screws at L4 and L5. Intervening interbody spacer at L4-L5. Minimal anterolisthesis of L4 on L5, similar to prior. Please see the performing provider's procedural report for further detail. IMPRESSION: Intraoperative fluoroscopy, as detailed above. Electronically Signed   By: Margaretha Sheffield MD   On: 01/13/2021 15:58    Assessment/Plan: Patient is post-op day 1 s/p  right L4/5 TLIF. She is recovering well and reports a significant reduction of her preoperative symptoms.  Her only complaint is intermittent stabbing type pain in her left side that corresponds with activity.  She has ambulated with nursing staff. She is awaiting PT/OT evaluation.  Continue LSO brace when OOB. Continue working on pain control, mobility and ambulating patient. Will plan for discharge today.     LOS: 1 day     Marvis Moeller, DNP, NP-C 01/14/2021, 7:47 AM

## 2021-01-14 NOTE — Discharge Summary (Signed)
Physician Discharge Summary  Patient ID: SHAQUINA GILLHAM MRN: [id number] DOB/AGE: 1946/03/09 75 y.o.  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 The patient tolerated the procedure well and was taken to the recovery room and then to the floor in stable condition. The hospital course was routine. There were no complications. The wound remained clean dry and intact. Pt had appropriate back soreness. No complaints of leg pain or new N/T/W. The patient remained afebrile with stable vital signs, and tolerated a regular diet. The patient continued to increase activities, and pain was well controlled with oral pain medications.   Consults: None  Significant Diagnostic Studies: radiology: Intraoperative radiographs   Treatments: surgery: Right Lumbar Four-Five Transforaminal lumbar interbody fusion (Right) - with expandable titanium cage, autograft, pedicle screw fixation, posterolateral arthrodesis  Discharge Exam: Blood pressure (!) 121/44, pulse 80, temperature 98.7 F (37.1 C), temperature source Oral, resp. rate 16, height 5\' 7"  (1.702 m), weight 60.3 kg, SpO2 100 %.  Physical Exam: Patient is awake, A/O X 4, conversant, and in good spirits. Speech is fluent and appropriate. Doing well. MAEW with good strength that is symmetric bilaterally. Dressing is clean dry intact. Incision is well approximated with no drainage, erythema, or edema.  Disposition: Discharge disposition: 01-Home or Self Care        Allergies as of 01/14/2021      Reactions   Cholestyramine  Nausea And Vomiting   Ciprofloxacin Nausea And Vomiting   Codeine Sulfate Nausea And Vomiting   Keflex [cephalexin] Nausea And Vomiting   Macrobid [nitrofurantoin Macrocrystal] Nausea And Vomiting   Sulfa Antibiotics Nausea And Vomiting, Rash      Medication List    STOP taking these medications   traMADol 50 MG tablet Commonly known as: ULTRAM     TAKE these medications   albuterol 108 (90 Base) MCG/ACT inhaler Commonly known as: VENTOLIN HFA Inhale 2 puffs into the lungs as needed for wheezing or shortness of breath.   aspirin 81 MG EC tablet Take 81 mg by mouth daily.   diazepam 2 MG tablet Commonly known as: VALIUM TAKE ONE TABLET (2 MG) BY MOUTH EVERY 12HOURS AS NEEDED FOR ANXIETY What changed: See the new instructions.   famotidine 20 MG tablet Commonly known as: PEPCID TAKE ONE TABLET BY MOUTH TWICE DAILY   hydrALAZINE 50 MG tablet Commonly known as: APRESOLINE Take 50 mg by mouth 3 (three) times daily.   HYDROcodone-acetaminophen 5-325 MG tablet Commonly known as: NORCO/VICODIN Take 1-2 tablets by mouth every 6 (six) hours as needed for severe pain ((score 7 to 10)).   meclizine 25 MG tablet Commonly known as: ANTIVERT Take 1 tablet (25 mg total) by mouth 2 (two) times daily as needed for dizziness.   methocarbamol 500 MG tablet Commonly known as: ROBAXIN Take 1 tablet (500 mg total) by mouth every 6 (six) hours as needed for muscle spasms.   Molnupiravir 200 MG Caps TAKE 4 CAPSULES (800 MG) BY MOUTH 2 TIMES DAILY FOR 5 DAYS.   PARoxetine 20 MG tablet Commonly known as: PAXIL TAKE 1 TABLET BY MOUTH DAILY What changed: how much to take   rosuvastatin 10  MG tablet Commonly known as: CRESTOR TAKE ONE TABLET EVERY DAY   triamterene-hydrochlorothiazide 37.5-25 MG capsule Commonly known as: DYAZIDE TAKE 1 CAPSULE EVERY DAY        Signed: Marvis Moeller, DNP, NP-C 01/14/2021, 8:58 AM

## 2021-01-28 ENCOUNTER — Other Ambulatory Visit: Payer: Self-pay | Admitting: Family Medicine

## 2021-01-28 DIAGNOSIS — E782 Mixed hyperlipidemia: Secondary | ICD-10-CM

## 2021-01-28 DIAGNOSIS — E7800 Pure hypercholesterolemia, unspecified: Secondary | ICD-10-CM

## 2021-02-02 ENCOUNTER — Other Ambulatory Visit: Payer: Self-pay

## 2021-02-02 ENCOUNTER — Inpatient Hospital Stay
Admission: EM | Admit: 2021-02-02 | Discharge: 2021-02-04 | DRG: 392 | Disposition: A | Discharge status: Home / Self Care | Payer: PPO | Attending: Internal Medicine | Admitting: Internal Medicine

## 2021-02-02 ENCOUNTER — Emergency Department: Payer: PPO

## 2021-02-02 DIAGNOSIS — Z885 Allergy status to narcotic agent status: Secondary | ICD-10-CM | POA: Diagnosis not present

## 2021-02-02 DIAGNOSIS — K5750 Diverticulosis of both small and large intestine without perforation or abscess without bleeding: Secondary | ICD-10-CM | POA: Diagnosis not present

## 2021-02-02 DIAGNOSIS — Z8249 Family history of ischemic heart disease and other diseases of the circulatory system: Secondary | ICD-10-CM

## 2021-02-02 DIAGNOSIS — N1831 Chronic kidney disease, stage 3a: Secondary | ICD-10-CM | POA: Diagnosis not present

## 2021-02-02 DIAGNOSIS — D72829 Elevated white blood cell count, unspecified: Secondary | ICD-10-CM | POA: Diagnosis present

## 2021-02-02 DIAGNOSIS — F32A Depression, unspecified: Secondary | ICD-10-CM | POA: Diagnosis present

## 2021-02-02 DIAGNOSIS — Z961 Presence of intraocular lens: Secondary | ICD-10-CM | POA: Diagnosis present

## 2021-02-02 DIAGNOSIS — F419 Anxiety disorder, unspecified: Secondary | ICD-10-CM | POA: Diagnosis not present

## 2021-02-02 DIAGNOSIS — Z9842 Cataract extraction status, left eye: Secondary | ICD-10-CM | POA: Diagnosis not present

## 2021-02-02 DIAGNOSIS — Z79899 Other long term (current) drug therapy: Secondary | ICD-10-CM | POA: Diagnosis not present

## 2021-02-02 DIAGNOSIS — Z9841 Cataract extraction status, right eye: Secondary | ICD-10-CM

## 2021-02-02 DIAGNOSIS — N179 Acute kidney failure, unspecified: Secondary | ICD-10-CM | POA: Diagnosis present

## 2021-02-02 DIAGNOSIS — Z888 Allergy status to other drugs, medicaments and biological substances status: Secondary | ICD-10-CM | POA: Diagnosis not present

## 2021-02-02 DIAGNOSIS — I5032 Chronic diastolic (congestive) heart failure: Secondary | ICD-10-CM | POA: Diagnosis not present

## 2021-02-02 DIAGNOSIS — Z882 Allergy status to sulfonamides status: Secondary | ICD-10-CM

## 2021-02-02 DIAGNOSIS — D631 Anemia in chronic kidney disease: Secondary | ICD-10-CM | POA: Diagnosis not present

## 2021-02-02 DIAGNOSIS — R634 Abnormal weight loss: Secondary | ICD-10-CM

## 2021-02-02 DIAGNOSIS — K219 Gastro-esophageal reflux disease without esophagitis: Secondary | ICD-10-CM | POA: Diagnosis present

## 2021-02-02 DIAGNOSIS — E860 Dehydration: Secondary | ICD-10-CM | POA: Diagnosis present

## 2021-02-02 DIAGNOSIS — Z881 Allergy status to other antibiotic agents status: Secondary | ICD-10-CM | POA: Diagnosis not present

## 2021-02-02 DIAGNOSIS — E876 Hypokalemia: Secondary | ICD-10-CM | POA: Diagnosis present

## 2021-02-02 DIAGNOSIS — K5732 Diverticulitis of large intestine without perforation or abscess without bleeding: Principal | ICD-10-CM | POA: Diagnosis present

## 2021-02-02 DIAGNOSIS — Z800 Family history of malignant neoplasm of digestive organs: Secondary | ICD-10-CM

## 2021-02-02 DIAGNOSIS — Z90710 Acquired absence of both cervix and uterus: Secondary | ICD-10-CM | POA: Diagnosis not present

## 2021-02-02 DIAGNOSIS — I130 Hypertensive heart and chronic kidney disease with heart failure and stage 1 through stage 4 chronic kidney disease, or unspecified chronic kidney disease: Secondary | ICD-10-CM | POA: Diagnosis not present

## 2021-02-02 DIAGNOSIS — R531 Weakness: Secondary | ICD-10-CM | POA: Diagnosis not present

## 2021-02-02 DIAGNOSIS — Z981 Arthrodesis status: Secondary | ICD-10-CM | POA: Diagnosis not present

## 2021-02-02 DIAGNOSIS — K56609 Unspecified intestinal obstruction, unspecified as to partial versus complete obstruction: Secondary | ICD-10-CM | POA: Diagnosis not present

## 2021-02-02 DIAGNOSIS — Z9049 Acquired absence of other specified parts of digestive tract: Secondary | ICD-10-CM

## 2021-02-02 DIAGNOSIS — K5792 Diverticulitis of intestine, part unspecified, without perforation or abscess without bleeding: Secondary | ICD-10-CM

## 2021-02-02 DIAGNOSIS — K5900 Constipation, unspecified: Secondary | ICD-10-CM | POA: Diagnosis not present

## 2021-02-02 DIAGNOSIS — Z7982 Long term (current) use of aspirin: Secondary | ICD-10-CM

## 2021-02-02 DIAGNOSIS — Z825 Family history of asthma and other chronic lower respiratory diseases: Secondary | ICD-10-CM

## 2021-02-02 DIAGNOSIS — I10 Essential (primary) hypertension: Secondary | ICD-10-CM | POA: Diagnosis present

## 2021-02-02 DIAGNOSIS — E785 Hyperlipidemia, unspecified: Secondary | ICD-10-CM | POA: Diagnosis not present

## 2021-02-02 DIAGNOSIS — N281 Cyst of kidney, acquired: Secondary | ICD-10-CM | POA: Diagnosis not present

## 2021-02-02 DIAGNOSIS — I700 Atherosclerosis of aorta: Secondary | ICD-10-CM | POA: Diagnosis present

## 2021-02-02 DIAGNOSIS — K7689 Other specified diseases of liver: Secondary | ICD-10-CM | POA: Diagnosis not present

## 2021-02-02 DIAGNOSIS — Z20822 Contact with and (suspected) exposure to covid-19: Secondary | ICD-10-CM | POA: Diagnosis not present

## 2021-02-02 LAB — CBC
HCT: 35.6 % — ABNORMAL LOW (ref 36.0–46.0)
Hemoglobin: 12.3 g/dL (ref 12.0–15.0)
MCH: 31.8 pg (ref 26.0–34.0)
MCHC: 34.6 g/dL (ref 30.0–36.0)
MCV: 92 fL (ref 80.0–100.0)
Platelets: 459 10*3/uL — ABNORMAL HIGH (ref 150–400)
RBC: 3.87 MIL/uL (ref 3.87–5.11)
RDW: 12.1 % (ref 11.5–15.5)
WBC: 12.5 10*3/uL — ABNORMAL HIGH (ref 4.0–10.5)
nRBC: 0 % (ref 0.0–0.2)

## 2021-02-02 LAB — COMPREHENSIVE METABOLIC PANEL
ALT: 11 U/L (ref 0–44)
AST: 30 U/L (ref 15–41)
Albumin: 4.5 g/dL (ref 3.5–5.0)
Alkaline Phosphatase: 117 U/L (ref 38–126)
Anion gap: 15 (ref 5–15)
BUN: 33 mg/dL — ABNORMAL HIGH (ref 8–23)
CO2: 21 mmol/L — ABNORMAL LOW (ref 22–32)
Calcium: 10 mg/dL (ref 8.9–10.3)
Chloride: 103 mmol/L (ref 98–111)
Creatinine, Ser: 1.54 mg/dL — ABNORMAL HIGH (ref 0.44–1.00)
GFR, Estimated: 35 mL/min — ABNORMAL LOW (ref >60)
Glucose, Bld: 181 mg/dL — ABNORMAL HIGH (ref 70–99)
Potassium: 3.5 mmol/L (ref 3.5–5.1)
Sodium: 139 mmol/L (ref 135–145)
Total Bilirubin: 0.7 mg/dL (ref 0.3–1.2)
Total Protein: 8.2 g/dL — ABNORMAL HIGH (ref 6.5–8.1)

## 2021-02-02 LAB — LIPASE, BLOOD: Lipase: 36 U/L (ref 11–51)

## 2021-02-02 LAB — RESP PANEL BY RT-PCR (FLU A&B, COVID) ARPGX2
Influenza A by PCR: NEGATIVE
Influenza B by PCR: NEGATIVE
SARS Coronavirus 2 by RT PCR: NEGATIVE

## 2021-02-02 LAB — LACTIC ACID, PLASMA
Lactic Acid, Venous: 1.3 mmol/L (ref 0.5–1.9)
Lactic Acid, Venous: 1 mmol/L (ref 0.5–1.9)

## 2021-02-02 MED ORDER — HYDRALAZINE HCL 50 MG PO TABS
50.0000 mg | ORAL_TABLET | Freq: Three times a day (TID) | ORAL | Status: DC
  Administered 2021-02-02 – 2021-02-04 (×6): 50 mg via ORAL
  Filled 2021-02-02 (×6): qty 1

## 2021-02-02 MED ORDER — ENOXAPARIN SODIUM 40 MG/0.4ML ~~LOC~~ SOLN
40.0000 mg | SUBCUTANEOUS | Status: DC
  Administered 2021-02-02 – 2021-02-03 (×2): 40 mg via SUBCUTANEOUS
  Filled 2021-02-02 (×2): qty 0.4

## 2021-02-02 MED ORDER — METOCLOPRAMIDE HCL 5 MG/ML IJ SOLN
10.0000 mg | Freq: Once | INTRAMUSCULAR | Status: AC
  Administered 2021-02-02: 10 mg via INTRAVENOUS
  Filled 2021-02-02: qty 2

## 2021-02-02 MED ORDER — SODIUM CHLORIDE 0.9 % IV SOLN
2.0000 g | Freq: Every day | INTRAVENOUS | Status: DC
  Administered 2021-02-02 – 2021-02-03 (×2): 2 g via INTRAVENOUS
  Filled 2021-02-02: qty 20
  Filled 2021-02-02 (×2): qty 2

## 2021-02-02 MED ORDER — ACETAMINOPHEN 325 MG PO TABS: 650.0000 mg | ORAL_TABLET | Freq: Four times a day (QID) | ORAL | Status: DC | PRN

## 2021-02-02 MED ORDER — ASPIRIN EC 81 MG PO TBEC
81.0000 mg | DELAYED_RELEASE_TABLET | Freq: Every day | ORAL | Status: DC
  Administered 2021-02-02 – 2021-02-04 (×3): 81 mg via ORAL
  Filled 2021-02-02 (×3): qty 1

## 2021-02-02 MED ORDER — POLYETHYLENE GLYCOL 3350 17 G PO PACK
17.0000 g | PACK | Freq: Every day | ORAL | Status: DC
  Administered 2021-02-03 – 2021-02-04 (×2): 17 g via ORAL
  Filled 2021-02-02 (×2): qty 1

## 2021-02-02 MED ORDER — ONDANSETRON HCL 4 MG/2ML IJ SOLN
4.0000 mg | Freq: Four times a day (QID) | INTRAMUSCULAR | Status: DC | PRN
Start: 2021-02-02 — End: 2021-02-04
  Administered 2021-02-03: 4 mg via INTRAVENOUS
  Filled 2021-02-02: qty 2

## 2021-02-02 MED ORDER — METRONIDAZOLE IN NACL 5-0.79 MG/ML-% IV SOLN
500.0000 mg | Freq: Three times a day (TID) | INTRAVENOUS | Status: DC
  Administered 2021-02-02 – 2021-02-03 (×3): 500 mg via INTRAVENOUS
  Filled 2021-02-02 (×5): qty 100

## 2021-02-02 MED ORDER — ALBUTEROL SULFATE HFA 108 (90 BASE) MCG/ACT IN AERS
2.0000 | INHALATION_SPRAY | Freq: Four times a day (QID) | RESPIRATORY_TRACT | Status: DC | PRN
  Filled 2021-02-02: qty 6.7

## 2021-02-02 MED ORDER — MORPHINE SULFATE (PF) 4 MG/ML IV SOLN
4.0000 mg | Freq: Once | INTRAVENOUS | Status: AC
  Administered 2021-02-02: 4 mg via INTRAVENOUS
  Filled 2021-02-02: qty 1

## 2021-02-02 MED ORDER — ONDANSETRON HCL 4 MG PO TABS: 4.0000 mg | ORAL_TABLET | Freq: Four times a day (QID) | ORAL | Status: DC | PRN

## 2021-02-02 MED ORDER — PANTOPRAZOLE SODIUM 40 MG IV SOLR
40.0000 mg | Freq: Every day | INTRAVENOUS | Status: DC
  Administered 2021-02-02 – 2021-02-03 (×2): 40 mg via INTRAVENOUS
  Filled 2021-02-02 (×3): qty 40

## 2021-02-02 MED ORDER — SODIUM CHLORIDE 0.9 % IV BOLUS
1000.0000 mL | Freq: Once | INTRAVENOUS | Status: AC
  Administered 2021-02-02: 1000 mL via INTRAVENOUS

## 2021-02-02 MED ORDER — ONDANSETRON HCL 4 MG/2ML IJ SOLN
4.0000 mg | Freq: Once | INTRAMUSCULAR | Status: AC
  Administered 2021-02-02: 4 mg via INTRAVENOUS

## 2021-02-02 MED ORDER — ACETAMINOPHEN 650 MG RE SUPP: 650.0000 mg | Freq: Four times a day (QID) | RECTAL | Status: DC | PRN

## 2021-02-02 MED ORDER — PAROXETINE HCL 20 MG PO TABS
20.0000 mg | ORAL_TABLET | Freq: Every day | ORAL | Status: DC
  Administered 2021-02-03 – 2021-02-04 (×2): 20 mg via ORAL
  Filled 2021-02-02 (×2): qty 1

## 2021-02-02 MED ORDER — FAMOTIDINE 20 MG PO TABS
20.0000 mg | ORAL_TABLET | Freq: Two times a day (BID) | ORAL | Status: DC
  Administered 2021-02-02 – 2021-02-04 (×4): 20 mg via ORAL
  Filled 2021-02-02 (×4): qty 1

## 2021-02-02 MED ORDER — MECLIZINE HCL 25 MG PO TABS
25.0000 mg | ORAL_TABLET | Freq: Two times a day (BID) | ORAL | Status: DC | PRN
  Filled 2021-02-02: qty 1

## 2021-02-02 MED ORDER — ROSUVASTATIN CALCIUM 10 MG PO TABS
10.0000 mg | ORAL_TABLET | Freq: Every day | ORAL | Status: DC
  Administered 2021-02-03 – 2021-02-04 (×2): 10 mg via ORAL
  Filled 2021-02-02 (×2): qty 1

## 2021-02-02 MED ORDER — IOHEXOL 300 MG/ML  SOLN
75.0000 mL | Freq: Once | INTRAMUSCULAR | Status: AC | PRN
  Administered 2021-02-02: 75 mL via INTRAVENOUS

## 2021-02-02 MED ORDER — METHOCARBAMOL 500 MG PO TABS
500.0000 mg | ORAL_TABLET | Freq: Four times a day (QID) | ORAL | Status: DC | PRN
  Administered 2021-02-03: 500 mg via ORAL
  Filled 2021-02-02 (×2): qty 1

## 2021-02-02 MED ORDER — SODIUM CHLORIDE 0.45 % IV SOLN: INTRAVENOUS | Status: DC

## 2021-02-02 NOTE — H&P (Signed)
History and Physical    Jessica Armstrong [facility] [id number] DOB: 17-Sep-1946 DOA: 02/02/2021  PCP: Jerrol Banana., MD   Patient coming from: Home  I have personally briefly reviewed patient's old medical records in [place]  Chief Complaint: Abdominal pain                               Nausea/vomiting  HPI: Jessica Armstrong is a 75 y.o. female with medical history significant for hypertension, depression, diverticulosis, status post recent right lumbar 4 - 5 transforaminal lumbar interbody fusion which was done on 01/13/21 who presents to the emergency room for evaluation of lower abdominal pain mostly suprapubic and left lower quadrant associated with nausea, vomiting and poor oral intake.  She rates her abdominal pain an 8 x 10 in intensity at its worst.  She has had chills but denies having any fever. Patient states that since her surgery she feels she has been going downhill.  She has had persistent nausea and has been unable to tolerate any oral intake because the smell of food makes her sick.  She has also been unable to tolerate any liquids as well.  She has had constipation and despite using a suppository has not had a regular bowel movement.  She has been on opioid therapy for pain control. She denies having any chest pain, no shortness of breath, no headache, no dizziness, no lightheadedness, no urinary frequency, no nocturia, no dysuria, no blurred vision, no headache, no palpitations, no diaphoresis. Labs show sodium 139, potassium 3.5, chloride 103, bicarb 21, glucose 181, BUN 33 compared to a baseline of 17, 2 weeks ago, serum creatinine 1.54 compared to baseline of 0.99, 2 weeks ago, calcium 10.0, alkaline phosphatase 117, albumin 4.5, lipase 36, AST 30, ALT 11, total protein 8.2, total bilirubin 0.7, white count 12.5, hemoglobin 12.3, hematocrit 35.6, MCV 90.0, RDW 12.1, platelet count 459 CT scan of abdomen and pelvis without contrast shows sigmoid diverticulosis. Mild soft  tissue haziness  around the sigmoid colon is noted along with a small amount of fluid along the left pelvic sidewall. Correlate for any clinical signs or symptoms of acute diverticulitis. Nonobstructive bowel gas pattern. Aortic atherosclerosis. Twelve-lead EKG reviewed by me shows sinus rhythm with abnormal R wave progression.    ED Course: Patient is a 75 year old female who presents to the ER for evaluation of abdominal pain, nausea, vomiting and anorexia.  Patient is status post right lumbar 4 - 5 transforaminal lumbar interbody fusion which was done on 01/13/21.  She has been on opioids for pain control since her surgery. Labs show worsening of her renal function from baseline, serum creatinine 0.99 >> 1.54 and BUN 17 >> 33. She also has left lower quadrant tenderness with associated leukocytosis suggestive of acute diverticulitis. She will be admitted to the hospital for further evaluation.    Review of Systems: As per HPI otherwise all other systems reviewed and negative.    Past Medical History:  Diagnosis Date  . Allergies   . Anemia    mild; no medical treatment  . Anxiety   . Arthritis   . Carotid artery stenosis    a. doppler [id number]: RICA 123456, LICA 123456 f/u 1 year  . Cervical spondyloarthritis   . Chronic diastolic CHF (congestive heart failure) ([place])    a. echo 04/2015: EF 55-60%, no RWMA, mitral valve w/ systolic bowing w/o prolapse, PASP 53 mmHg  .  Claudication ([place])   . Colon polyp   . Cystitis   . Depression    situational  . Diverticulosis   . Dizziness   . Dyspnea   . Dysrhythmia    bradycardia  . GERD (gastroesophageal reflux disease)   . H/O vertigo   . Heart murmur   . History of measles   . History of mumps   . Hypercholesterolemia   . Hyperglycemia   . Hypertension    vs hypotension  . IBS (irritable bowel syndrome)   . Need for SBE (subacute bacterial endocarditis) prophylaxis   . Neuropathy   . Palpitations   . PONV (postoperative  nausea and vomiting)    severe n/v after anesthesia  . Post-menopausal   . Pulmonary valve dysplasia   . Rotator cuff tear, right   . Seasonal allergies   . TMJ (dislocation of temporomandibular joint)   . TMJ (temporomandibular joint syndrome)   . Tricuspid regurgitation    a. echo 04/2013: EF 55-60%, nl wall motion, mild to mod TR, PASP 35 mm Hg    Past Surgical History:  Procedure Laterality Date  . ABDOMINAL HYSTERECTOMY    . ANTERIOR CERVICAL DECOMP/DISCECTOMY FUSION N/A 08/26/2013   Procedure: CERVICAL SIX TO SEVEN ANTERIOR CERVICAL DECOMPRESSION/DISCECTOMY FUSION 1 LEVEL;  Surgeon: Erline Levine, MD;  Location: [place] [facility]URO ORS;  Service: Neurosurgery;  Laterality: N/A;  C6-7 Anterior cervical decompression/diskectomy/fusion  . BREAST BIOPSY Right    neg  . CATARACT EXTRACTION W/PHACO Left 05/05/2020   Procedure: CATARACT EXTRACTION PHACO AND INTRAOCULAR LENS PLACEMENT ([place]) LEFT;  Surgeon: Leandrew Koyanagi, MD;  Location: [place];  Service: Ophthalmology;  Laterality: Left;  3.80 0:43.4 8.8%  . CATARACT EXTRACTION W/PHACO Right 06/02/2020   Procedure: CATARACT EXTRACTION PHACO AND INTRAOCULAR LENS PLACEMENT ([place]) RIGHT;  Surgeon: Leandrew Koyanagi, MD;  Location: [place];  Service: Ophthalmology;  Laterality: Right;  4.97 0:57.8 8.6%  . CHOLECYSTECTOMY    . COLONOSCOPY WITH PROPOFOL N/A 01/14/2016   Procedure: COLONOSCOPY WITH PROPOFOL;  Surgeon: Manya Silvas, MD;  Location[facility] ENDOSCOPY;  Service: Endoscopy;  Laterality: N/A;  . COLONOSCOPY WITH PROPOFOL N/A 11/04/2018   Procedure: COLONOSCOPY WITH PROPOFOL;  Surgeon: Manya Silvas, MD;  Location: [facility] ENDOSCOPY;  Service: Endoscopy;  Laterality: N/A;  . COLONOSCOPY WITH PROPOFOL N/A 07/09/2019   Procedure: COLONOSCOPY WITH PROPOFOL;  Surgeon: Toledo, Benay Pike, MD;  Location: [facility] ENDOSCOPY;  Service: Gastroenterology;  Laterality: N/A;  . ESOPHAGOGASTRODUODENOSCOPY (EGD) WITH PROPOFOL N/A  01/14/2016   Procedure: ESOPHAGOGASTRODUODENOSCOPY (EGD) WITH PROPOFOL;  Surgeon: Manya Silvas, MD;  Location: [facility] ENDOSCOPY;  Service: Endoscopy;  Laterality: N/A;  . ESOPHAGOGASTRODUODENOSCOPY (EGD) WITH PROPOFOL N/A 11/04/2018   Procedure: ESOPHAGOGASTRODUODENOSCOPY (EGD) WITH PROPOFOL;  Surgeon: Manya Silvas, MD;  Location: [facility] ENDOSCOPY;  Service: Endoscopy;  Laterality: N/A;  . EYE SURGERY     benign tumor of eye  . SHOULDER ARTHROSCOPY WITH ROTATOR CUFF REPAIR AND SUBACROMIAL DECOMPRESSION Right 05/31/2017   Procedure: SHOULDER ARTHROSCOPY WITH SUBACROMIAL DECOMPRESSION AND DISTAL CLAVICAL EXCISION;  Surgeon: Tania Ade, MD;  Location: [place];  Service: Orthopedics;  Laterality: Right;  SHOULDER ARTHROSCOPY WITH SUBACROMIAL DECOMPRESSION AND DISTAL CLAVICAL EXCISION  . TRANSFORAMINAL LUMBAR INTERBODY FUSION (TLIF) WITH PEDICLE SCREW FIXATION 1 LEVEL Right 01/13/2021   Procedure: Right Lumbar Four-Five Transforaminal lumbar interbody fusion;  Surgeon: Erline Levine, MD;  Location: [place];  Service: Neurosurgery;  Laterality: Right;  3C/RM 20     reports that she has never smoked. She has  never used smokeless tobacco. She reports current alcohol use. She reports that she does not use drugs.  Allergies  Allergen Reactions  . Cholestyramine Nausea And Vomiting  . Ciprofloxacin Nausea And Vomiting  . Codeine Sulfate Nausea And Vomiting  . Keflex [Cephalexin] Nausea And Vomiting  . Macrobid [Nitrofurantoin Macrocrystal] Nausea And Vomiting  . Sulfa Antibiotics Nausea And Vomiting and Rash    Family History  Problem Relation Age of Onset  . Heart disease Mother   . Hypertension Mother   . COPD Mother        was a smoker  . Hypertension Brother   . Colon cancer Father   . Asthma Maternal Grandfather   . Breast cancer Neg Hx       Prior to Admission medications   Medication Sig Start Date End Date Taking? Authorizing Provider  albuterol (VENTOLIN HFA) 108 (90 Base)  MCG/ACT inhaler Inhale 2 puffs into the lungs as needed for wheezing or shortness of breath.    [provider]  aspirin 81 MG EC tablet Take 81 mg by mouth daily. 12/22/20   [provider]  diazepam (VALIUM) 2 MG tablet TAKE ONE TABLET (2 MG) BY MOUTH EVERY 12HOURS AS NEEDED FOR ANXIETY Patient taking differently: Take 2 mg by mouth every 12 (twelve) hours as needed (TMJ). 12/20/20   Jerrol Banana., MD  famotidine (PEPCID) 20 MG tablet TAKE ONE TABLET BY MOUTH TWICE DAILY Patient taking differently: Take 20 mg by mouth 2 (two) times daily. 06/28/20   Lucilla Lame, MD  hydrALAZINE (APRESOLINE) 50 MG tablet Take 50 mg by mouth 3 (three) times daily.    [provider]  HYDROcodone-acetaminophen (NORCO/VICODIN) 5-325 MG tablet Take 1-2 tablets by mouth every 6 (six) hours as needed for severe pain ((score 7 to 10)). 01/14/21   Marvis Moeller, NP  meclizine (ANTIVERT) 25 MG tablet Take 1 tablet (25 mg total) by mouth 2 (two) times daily as needed for dizziness. 06/22/20   Jerrol Banana., MD  methocarbamol (ROBAXIN) 500 MG tablet Take 1 tablet (500 mg total) by mouth every 6 (six) hours as needed for muscle spasms. 01/14/21   Marvis Moeller, NP  Molnupiravir 200 MG CAPS TAKE 4 CAPSULES (800 MG) BY MOUTH 2 TIMES DAILY FOR 5 DAYS. 11/25/20 11/25/21  Parrett, Fonnie Mu, NP  PARoxetine (PAXIL) 20 MG tablet TAKE 1 TABLET BY MOUTH DAILY Patient taking differently: Take 10 mg by mouth daily. 04/18/20   Jerrol Banana., MD  rosuvastatin (CRESTOR) 10 MG tablet TAKE ONE TABLET EVERY DAY 01/28/21   Jerrol Banana., MD  triamterene-hydrochlorothiazide (DYAZIDE) 37.5-25 MG capsule TAKE 1 CAPSULE EVERY DAY Patient taking differently: Take 1 capsule by mouth daily. 12/20/20   Jerrol Banana., MD    Physical Exam: Vitals:   02/02/21 1131 02/02/21 1234  BP: (!) 191/92 (!) 185/86  Pulse: (!) 114 64  Resp: (!) 22 17  Temp: 97.8 F (36.6 C)   TempSrc: Oral    SpO2: 99% 97%  Weight: 70.9 kg   Height: 5\' 7"  (1.702 m)      Vitals:   02/02/21 1131 02/02/21 1234  BP: (!) 191/92 (!) 185/86  Pulse: (!) 114 64  Resp: (!) 22 17  Temp: 97.8 F (36.6 C)   TempSrc: Oral   SpO2: 99% 97%  Weight: 70.9 kg   Height: 5\' 7"  (1.702 m)       Constitutional: Alert and oriented x 3 .  Appears uncomfortable HEENT:      Head: Normocephalic and atraumatic.         Eyes: PERLA, EOMI, Conjunctivae are normal. Sclera is non-icteric.       Mouth/Throat: Mucous membranes are dry      Neck: Supple with no signs of meningismus. Cardiovascular:  Tachycardic. No murmurs, gallops, or rubs. 2+ symmetrical distal pulses are present . No JVD. No LE edema Respiratory: Respiratory effort normal .Lungs sounds clear bilaterally. No wheezes, crackles, or rhonchi.  Gastrointestinal: Soft,  Tender left lower quadrant and suprapubic area, and non distended with positive bowel sounds.  Genitourinary: No CVA tenderness. Musculoskeletal: Nontender with normal range of motion in all extremities. No cyanosis, or erythema of extremities.  Sensation in the lower back which appears clean and dry Neurologic:  Face is symmetric. Moving all extremities. No gross focal neurologic deficits  Skin: Skin is warm, dry.  No rash or ulcers Psychiatric: Mood and affect are normal   Labs on Admission: I have personally reviewed following labs and imaging studies  CBC: Recent Labs  Lab 02/02/21 1133  WBC 12.5*  HGB 12.3  HCT 35.6*  MCV 92.0  PLT 409*   Basic Metabolic Panel: Recent Labs  Lab 02/02/21 1133  NA 139  K 3.5  CL 103  CO2 21*  GLUCOSE 181*  BUN 33*  CREATININE 1.54*  CALCIUM 10.0   GFR: Estimated Creatinine Clearance: 30.2 mL/min (A) (by C-G formula based on SCr of 1.54 mg/dL (H)). Liver Function Tests: Recent Labs  Lab 02/02/21 1133  AST 30  ALT 11  ALKPHOS 117  BILITOT 0.7  PROT 8.2*  ALBUMIN 4.5   Recent Labs  Lab 02/02/21 1133  LIPASE 36   No  results for input(s): AMMONIA in the last 168 hours. Coagulation Profile: No results for input(s): INR, PROTIME in the last 168 hours. Cardiac Enzymes: No results for input(s): CKTOTAL, CKMB, CKMBINDEX, TROPONINI in the last 168 hours. BNP (last 3 results) No results for input(s): PROBNP in the last 8760 hours. HbA1C: No results for input(s): HGBA1C in the last 72 hours. CBG: No results for input(s): GLUCAP in the last 168 hours. Lipid Profile: No results for input(s): CHOL, HDL, LDLCALC, TRIG, CHOLHDL, LDLDIRECT in the last 72 hours. Thyroid Function Tests: No results for input(s): TSH, T4TOTAL, FREET4, T3FREE, THYROIDAB in the last 72 hours. Anemia Panel: No results for input(s): VITAMINB12, FOLATE, FERRITIN, TIBC, IRON, RETICCTPCT in the last 72 hours. Urine analysis:    Component Value Date/Time   BILIRUBINUR Negative 08/27/2020 1353   PROTEINUR Negative 08/27/2020 1353   UROBILINOGEN 0.2 08/27/2020 1353   NITRITE Negative 08/27/2020 1353   LEUKOCYTESUR Negative 08/27/2020 1353    Radiological Exams on Admission: CT ABDOMEN PELVIS W CONTRAST  Result Date: 02/02/2021 CLINICAL DATA:  Bowel obstruction EXAM: CT ABDOMEN AND PELVIS WITH CONTRAST TECHNIQUE: Multidetector CT imaging of the abdomen and pelvis was performed using the standard protocol following bolus administration of intravenous contrast. CONTRAST:  55mL OMNIPAQUE IOHEXOL 300 MG/ML  SOLN COMPARISON:  05/15/2019 FINDINGS: Lower chest: No acute abnormality. Hepatobiliary: Cyst identified within the left hepatic lobe measures 1.2 cm. 9 mm cyst within inferior right hepatic lobe is also noted. No suspicious liver abnormality. Previous cholecystectomy. No bile duct dilatation. Pancreas: Unremarkable. No pancreatic ductal dilatation or surrounding inflammatory changes. Spleen: Normal in size without focal abnormality. Adrenals/Urinary Tract: Normal appearance of the adrenal glands. The right kidney appears unremarkable. Cyst  within the anterolateral cortex of the left kidney measures  3.1 cm, image 23/2. Unchanged. Bladder unremarkable. Stomach/Bowel: The stomach appears normal. The appendix is visualized and is within normal limits. No small bowel wall thickening, inflammation or distension. No pathologic dilatation of the colon. There is extensive colonic diverticulosis involving the sigmoid colon. Mild soft tissue stranding of the around the sigmoid colon is noted without significant bowel wall thickening. Equivocal for uncomplicated diverticulitis. Moderate stool burden noted throughout the colon. Vascular/Lymphatic: Aortic atherosclerosis. No abdominopelvic adenopathy. Reproductive: Status post hysterectomy. No adnexal masses. Other: Small amount of fluid/edema extends along the peritoneal reflection in the left pelvis. No discrete fluid collections. Musculoskeletal: Postoperative changes from L4-5 PLIF with interbody fusion. No acute or suspicious osseous findings. IMPRESSION: 1. Sigmoid diverticulosis. Mild soft tissue haziness of the around the sigmoid colon is noted along with a small amount of fluid along the left pelvic sidewall. Correlate for any clinical signs or symptoms of acute diverticulitis. 2. Nonobstructive bowel gas pattern. 3. Aortic atherosclerosis. Aortic Atherosclerosis (ICD10-I70.0). Electronically Signed   By: Kerby Moors M.D.   On: 02/02/2021 12:48     Assessment/Plan Principal Problem:   Acute diverticulitis Active Problems:   Essential hypertension   GERD (gastroesophageal reflux disease)   Dehydration   Depression     Acute diverticulitis (POA) We will keep patient n.p.o. for now IV fluid hydration, antiemetics and IV antibiotic therapy with Rocephin and Flagyl We will administer soapsuds enema for constipation and place patient on daily MiraLAX    Dehydration Secondary to poor oral intake At baseline patient has a serum creatinine of 0.99 and on admission it is 1.54 Hydrate  patient Hold triamterene/hydrochlorothiazide for now    Hypertension Continue hydralazine   Depression Continue Paxil    Status post recent right lumbar 4 - 5 transforaminal lumbar interbody fusion Continue Robaxin as needed for muscle spasms Hold hydrocodone for now PT evaluation once patient's acute illness is stabilized   DVT prophylaxis: Lovenox Code Status: full code Family Communication: Greater than 50% of time was spent discussing patient's condition and plan of care with her and her husband at the bedside.  All questions and concerns have been addressed.  They verbalized understanding and agree with the plan. Disposition Plan: Back to previous home environment Consults called: none Status: At the time of admission, it appears that the appropriate admission status for this patient is inpatient. This is judged to be reasonable and necessary in order to provide the required intensity of service to ensure the patient's safety given the presenting symptoms, physical exam findings, and initial radiographic and laboratory data in the context of their comorbid conditions. Patient requires inpatient status due to high intensity of service, high risk for further deterioration and high frequency of surveillance required.    Collier Bullock MD [facility]     02/02/2021, 2:11 PM

## 2021-02-02 NOTE — ED Provider Notes (Addendum)
[facility] Emergency Department Provider Note  ____________________________________________   Event Date/Time   First MD Initiated Contact with Patient 02/02/21 1144     (approximate)  I have reviewed the triage vital signs and the nursing notes.   HISTORY  Chief Complaint Emesis and Constipation    HPI Jessica Armstrong is a 75 y.o. female with history of hypertension, heart murmur, IBS, and recent lumbar spine surgery 3 weeks ago presents with increased pain, abdominal pain, constipation, nausea, vomiting, and unable to eat and drink.  Patient states she stopped the pain medication because she was constipated.  She has only had 1 bowel movement since the surgery and had dark stools.  She got about a teaspoon of stool out this morning.  States she is unsure if this is because she has been able to eat or if she is constipated.  States pain in the lower abdomen across both lower quadrants.  No dysuria.  Patient is still urinating without difficulty.  Surgery was performed and [facility] by Dr. Vertell Limber   Past Medical History:  Diagnosis Date  . Allergies   . Anemia    mild; no medical treatment  . Anxiety   . Arthritis   . Carotid artery stenosis    a. doppler [id number]: RICA 123456, LICA 123456 f/u 1 year  . Cervical spondyloarthritis   . Chronic diastolic CHF (congestive heart failure) ([place])    a. echo 04/2015: EF 55-60%, no RWMA, mitral valve w/ systolic bowing w/o prolapse, PASP 53 mmHg  . Claudication ([place])   . Colon polyp   . Cystitis   . Depression    situational  . Diverticulosis   . Dizziness   . Dyspnea   . Dysrhythmia    bradycardia  . GERD (gastroesophageal reflux disease)   . H/O vertigo   . Heart murmur   . History of measles   . History of mumps   . Hypercholesterolemia   . Hyperglycemia   . Hypertension    vs hypotension  . IBS (irritable bowel syndrome)   . Need for SBE (subacute bacterial endocarditis) prophylaxis   . Neuropathy    . Palpitations   . PONV (postoperative nausea and vomiting)    severe n/v after anesthesia  . Post-menopausal   . Pulmonary valve dysplasia   . Rotator cuff tear, right   . Seasonal allergies   . TMJ (dislocation of temporomandibular joint)   . TMJ (temporomandibular joint syndrome)   . Tricuspid regurgitation    a. echo 04/2013: EF 55-60%, nl wall motion, mild to mod TR, PASP 35 mm Hg    Patient Active Problem List   Diagnosis Date Noted  . Acute diverticulitis 02/02/2021  . Spondylolisthesis of lumbar region 01/13/2021  . Vasovagal syncope 10/21/2019  . Lumbar facet joint pain 10/16/2019  . Osteoarthritis of hip 10/16/2019  . Chronic diarrhea of unknown origin 07/15/2019  . Elevated gastrin level 07/15/2019  . Diarrhea 02/20/2019  . Diverticulosis 02/20/2019  . Schatzki's ring of distal esophagus 02/20/2019  . Dizziness 06/30/2016  . Carotid artery stenosis   . Chronic diastolic CHF (congestive heart failure) ([place])   . Fatigue 04/19/2015  . Anxiety 04/19/2015  . Allergic rhinitis 04/19/2015  . Arthritis 04/19/2015  . Disorder of carbohydrate transport and metabolism ([place]) 04/19/2015  . Diverticulitis 04/19/2015  . Acid reflux 04/19/2015  . Hormone replacement therapy (postmenopausal) 04/19/2015  . Hypercholesteremia 04/19/2015  . Blood glucose elevated 04/19/2015  . Neuropathy 04/19/2015  . Pericarditis  04/19/2015  . Common wart 04/19/2015  . Gastroesophageal reflux disease with esophagitis 04/19/2015  . Hot flashes 06/10/2013  . Chest pain 04/08/2013  . SOB (shortness of breath) 04/08/2013  . Murmur 04/08/2013  . Essential hypertension 04/08/2013    Past Surgical History:  Procedure Laterality Date  . ABDOMINAL HYSTERECTOMY    . ANTERIOR CERVICAL DECOMP/DISCECTOMY FUSION N/A 08/26/2013   Procedure: CERVICAL SIX TO SEVEN ANTERIOR CERVICAL DECOMPRESSION/DISCECTOMY FUSION 1 LEVEL;  Surgeon: Erline Levine, MD;  Location: [place] NEURO ORS;  Service: Neurosurgery;   Laterality: N/A;  C6-7 Anterior cervical decompression/diskectomy/fusion  . BREAST BIOPSY Right    neg  . CATARACT EXTRACTION W/PHACO Left 05/05/2020   Procedure: CATARACT EXTRACTION PHACO AND INTRAOCULAR LENS PLACEMENT ([place]) LEFT;  Surgeon: Leandrew Koyanagi, MD;  Location: [place];  Service: Ophthalmology;  Laterality: Left;  3.80 0:43.4 8.8%  . CATARACT EXTRACTION W/PHACO Right 06/02/2020   Procedure: CATARACT EXTRACTION PHACO AND INTRAOCULAR LENS PLACEMENT ([place]) RIGHT;  Surgeon: Leandrew Koyanagi, MD;  Location: [place];  Service: Ophthalmology;  Laterality: Right;  4.97 0:57.8 8.6%  . CHOLECYSTECTOMY    . COLONOSCOPY WITH PROPOFOL N/A 01/14/2016   Procedure: COLONOSCOPY WITH PROPOFOL;  Surgeon: Manya Silvas, MD;  Location: [facility] ENDOSCOPY;  Service: Endoscopy;  Laterality: N/A;  . COLONOSCOPY WITH PROPOFOL N/A 11/04/2018   Procedure: COLONOSCOPY WITH PROPOFOL;  Surgeon: Manya Silvas, MD;  Location: [facility] ENDOSCOPY;  Service: Endoscopy;  Laterality: N/A;  . COLONOSCOPY WITH PROPOFOL N/A 07/09/2019   Procedure: COLONOSCOPY WITH PROPOFOL;  Surgeon: Toledo, Benay Pike, MD;  Location: [facility] ENDOSCOPY;  Service: Gastroenterology;  Laterality: N/A;  . ESOPHAGOGASTRODUODENOSCOPY (EGD) WITH PROPOFOL N/A 01/14/2016   Procedure: ESOPHAGOGASTRODUODENOSCOPY (EGD) WITH PROPOFOL;  Surgeon: Manya Silvas, MD;  Location[facility] ENDOSCOPY;  Service: Endoscopy;  Laterality: N/A;  . ESOPHAGOGASTRODUODENOSCOPY (EGD) WITH PROPOFOL N/A 11/04/2018   Procedure: ESOPHAGOGASTRODUODENOSCOPY (EGD) WITH PROPOFOL;  Surgeon: Manya Silvas, MD;  Location: [facility] ENDOSCOPY;  Service: Endoscopy;  Laterality: N/A;  . EYE SURGERY     benign tumor of eye  . SHOULDER ARTHROSCOPY WITH ROTATOR CUFF REPAIR AND SUBACROMIAL DECOMPRESSION Right 05/31/2017   Procedure: SHOULDER ARTHROSCOPY WITH SUBACROMIAL DECOMPRESSION AND DISTAL CLAVICAL EXCISION;  Surgeon: Tania Ade, MD;  Location: [place];   Service: Orthopedics;  Laterality: Right;  SHOULDER ARTHROSCOPY WITH SUBACROMIAL DECOMPRESSION AND DISTAL CLAVICAL EXCISION  . TRANSFORAMINAL LUMBAR INTERBODY FUSION (TLIF) WITH PEDICLE SCREW FIXATION 1 LEVEL Right 01/13/2021   Procedure: Right Lumbar Four-Five Transforaminal lumbar interbody fusion;  Surgeon: Erline Levine, MD;  Location: [place];  Service: Neurosurgery;  Laterality: Right;  3C/RM 20    Prior to Admission medications   Medication Sig Start Date End Date Taking? Authorizing Provider  albuterol (VENTOLIN HFA) 108 (90 Base) MCG/ACT inhaler Inhale 2 puffs into the lungs as needed for wheezing or shortness of breath.    [provider]  aspirin 81 MG EC tablet Take 81 mg by mouth daily. 12/22/20   [provider]  diazepam (VALIUM) 2 MG tablet TAKE ONE TABLET (2 MG) BY MOUTH EVERY 12HOURS AS NEEDED FOR ANXIETY Patient taking differently: Take 2 mg by mouth every 12 (twelve) hours as needed (TMJ). 12/20/20   Jerrol Banana., MD  famotidine (PEPCID) 20 MG tablet TAKE ONE TABLET BY MOUTH TWICE DAILY Patient taking differently: Take 20 mg by mouth 2 (two) times daily. 06/28/20   Lucilla Lame, MD  hydrALAZINE (APRESOLINE) 50 MG tablet Take 50 mg by mouth 3 (three) times daily.  [provider]  HYDROcodone-acetaminophen (NORCO/VICODIN) 5-325 MG tablet Take 1-2 tablets by mouth every 6 (six) hours as needed for severe pain ((score 7 to 10)). 01/14/21   Marvis Moeller, NP  meclizine (ANTIVERT) 25 MG tablet Take 1 tablet (25 mg total) by mouth 2 (two) times daily as needed for dizziness. 06/22/20   Jerrol Banana., MD  methocarbamol (ROBAXIN) 500 MG tablet Take 1 tablet (500 mg total) by mouth every 6 (six) hours as needed for muscle spasms. 01/14/21   Marvis Moeller, NP  Molnupiravir 200 MG CAPS TAKE 4 CAPSULES (800 MG) BY MOUTH 2 TIMES DAILY FOR 5 DAYS. 11/25/20 11/25/21  Parrett, Fonnie Mu, NP  PARoxetine (PAXIL) 20 MG tablet TAKE 1 TABLET BY MOUTH  DAILY Patient taking differently: Take 10 mg by mouth daily. 04/18/20   Jerrol Banana., MD  rosuvastatin (CRESTOR) 10 MG tablet TAKE ONE TABLET EVERY DAY 01/28/21   Jerrol Banana., MD  triamterene-hydrochlorothiazide (DYAZIDE) 37.5-25 MG capsule TAKE 1 CAPSULE EVERY DAY Patient taking differently: Take 1 capsule by mouth daily. 12/20/20   Jerrol Banana., MD    Allergies Cholestyramine, Ciprofloxacin, Codeine sulfate, Keflex [cephalexin], Macrobid [nitrofurantoin macrocrystal], and Sulfa antibiotics  Family History  Problem Relation Age of Onset  . Heart disease Mother   . Hypertension Mother   . COPD Mother        was a smoker  . Hypertension Brother   . Colon cancer Father   . Asthma Maternal Grandfather   . Breast cancer Neg Hx     Social History Social History   Tobacco Use  . Smoking status: Never Smoker  . Smokeless tobacco: Never Used  . 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 Vaping Use: Never used  Substance Use Topics  . Alcohol use: Yes    Comment: rare use 1-2 drinks per year.   . Drug use: No    Review of Systems  Constitutional: No fever/chills Eyes: No visual changes. ENT: No sore throat. Respiratory: Denies cough Cardiovascular: Denies chest pain Gastrointestinal: Positive abdominal pain Genitourinary: Negative for dysuria. Musculoskeletal: Positive for back pain. Skin: Negative for rash. Psychiatric: no mood changes,     ____________________________________________   PHYSICAL EXAM:  VITAL SIGNS: ED Triage Vitals [02/02/21 1131]  Enc Vitals Group     BP (!) 191/92     Pulse Rate (!) 114     Resp (!) 22     Temp 97.8 F (36.6 C)     Temp Source Oral     SpO2 99 %     Weight 156 lb 4.9 oz (70.9 kg)     Height 5\' 7"  (1.702 m)     Head Circumference      Peak Flow      Pain Score 5     Pain Loc      Pain Edu?      Excl. in [place]?     Constitutional: Alert and oriented.  Well appearing and in no acute distress.  Patient does appear weak and tired Eyes: Conjunctivae are normal.  Head: Atraumatic. Nose: No congestion/rhinnorhea. Mouth/Throat: Mucous membranes are moist.   Neck:  supple no lymphadenopathy noted Cardiovascular: Normal rate, regular rhythm. Heart sounds with murmur noted  Respiratory: Normal respiratory effort.  No retractions, lungs c t a  Abd: soft tender in lower quadrants bilaterally, bs decreased all 4 quad GU: deferred Musculoskeletal: FROM all  extremities, warm and well perfused Neurologic:  Normal speech and language.  Skin:  Skin is warm, dry and intact. No rash noted. Psychiatric: Mood and affect are normal. Speech and behavior are normal.  ____________________________________________   LABS (all labs ordered are listed, but only abnormal results are displayed)  Labs Reviewed  COMPREHENSIVE METABOLIC PANEL - Abnormal; Notable for the following components:      Result Value   CO2 21 (*)    Glucose, Bld 181 (*)    BUN 33 (*)    Creatinine, Ser 1.54 (*)    Total Protein 8.2 (*)    GFR, Estimated 35 (*)    All other components within normal limits  CBC - Abnormal; Notable for the following components:   WBC 12.5 (*)    HCT 35.6 (*)    Platelets 459 (*)    All other components within normal limits  LIPASE, BLOOD  URINALYSIS, COMPLETE (UACMP) WITH MICROSCOPIC   ____________________________________________   ____________________________________________  RADIOLOGY  CT abdomen/pelvis IV contrast  ____________________________________________   PROCEDURES  Procedure(s) performed: EKG showed normal sinus rhythm, see physician read  Procedures    ____________________________________________   INITIAL IMPRESSION / ASSESSMENT AND PLAN / ED COURSE  Pertinent labs & imaging results that were available during my care of the patient were reviewed by me and considered in my medical decision making (see chart for  details).   Patient 75 year old female presents with nausea/vomiting/constipation and recent lumbar surgery.  See HPI.  Physical exam shows patient to appear stable but she does appear to be weak and tired.  DDx: Volvulus, small bowel obstruction, constipation, gastroparesis, electrolyte imbalance, kidney failure  CBC is elevated WBC of 12.5, comprehensive metabolic panel shows elevated glucose at 181 without history of diabetes, BUN and creatinine are elevated showing AKI, lipase is normal, UA still pending  CT abdomen/pelvis IV contrast  I did speak with Dr. Charna Archer about using contrast due to the AKI.  He states he still comfortable using contrast.   .  CT abdomen/pelvis shows some fluid in the pelvis along with sigmoid diverticulitis with concerns for diverticulitis.  This was reviewed by me and confirmed by radiology  Spoke with Dr. Francine Graven for admission.  She will be admitting the patient for dehydration, AKI, diverticulitis and constipation.  All information was conveyed to the patient.  She is admitted in stable condition.  Carriann Hesse Siebert was evaluated in Emergency Department on 02/02/2021 for the symptoms described in the history of present illness. She was evaluated in the context of the global COVID-19 pandemic, which necessitated consideration that the patient might be at risk for infection with the SARS-CoV-2 virus that causes COVID-19. Institutional protocols and algorithms that pertain to the evaluation of patients at risk for COVID-19 are in a state of rapid change based on information released by regulatory bodies including the CDC and federal and state organizations. These policies and algorithms were followed during the patient's care in the ED.    As part of my medical decision making, I reviewed the following data within the [place] notes reviewed and incorporated, Labs reviewed , EKG interpreted NSR, Old chart reviewed, Radiograph reviewed ,  Discussed with admitting physician , Evaluated by EM attending , Notes from prior ED visits and [place] Controlled Substance Database  ____________________________________________   FINAL CLINICAL IMPRESSION(S) / ED DIAGNOSES  Final diagnoses:  AKI (acute kidney injury) ([place])  Acute diverticulitis  Weakness  Unintentional weight loss  Dehydration  NEW MEDICATIONS STARTED DURING THIS VISIT:  New Prescriptions   No medications on file     Note:  This document was prepared using Dragon voice recognition software and may include unintentional dictation errors.    Versie Starks, PA-C 02/02/21 1327    Caryn Section Linden Dolin, PA-C 02/02/21 1452    Blake Divine, MD 02/02/21 [phone]

## 2021-02-02 NOTE — ED Notes (Signed)
EDP at bedside  

## 2021-02-02 NOTE — ED Triage Notes (Signed)
From home due to NV and constipation since spinal surgery 01/13/21. Pt tearful on arrival. Dry heaving. States she cannot keep food or PO fluids down since 01/13/21

## 2021-02-03 LAB — BASIC METABOLIC PANEL
Anion gap: 9 (ref 5–15)
BUN: 27 mg/dL — ABNORMAL HIGH (ref 8–23)
CO2: 23 mmol/L (ref 22–32)
Calcium: 8.7 mg/dL — ABNORMAL LOW (ref 8.9–10.3)
Chloride: 105 mmol/L (ref 98–111)
Creatinine, Ser: 1.09 mg/dL — ABNORMAL HIGH (ref 0.44–1.00)
GFR, Estimated: 53 mL/min — ABNORMAL LOW (ref >60)
Glucose, Bld: 104 mg/dL — ABNORMAL HIGH (ref 70–99)
Potassium: 3.3 mmol/L — ABNORMAL LOW (ref 3.5–5.1)
Sodium: 137 mmol/L (ref 135–145)

## 2021-02-03 LAB — CBC
HCT: 29.2 % — ABNORMAL LOW (ref 36.0–46.0)
Hemoglobin: 9.9 g/dL — ABNORMAL LOW (ref 12.0–15.0)
MCH: 31.4 pg (ref 26.0–34.0)
MCHC: 33.9 g/dL (ref 30.0–36.0)
MCV: 92.7 fL (ref 80.0–100.0)
Platelets: 309 10*3/uL (ref 150–400)
RBC: 3.15 MIL/uL — ABNORMAL LOW (ref 3.87–5.11)
RDW: 12.2 % (ref 11.5–15.5)
WBC: 9.2 10*3/uL (ref 4.0–10.5)
nRBC: 0 % (ref 0.0–0.2)

## 2021-02-03 MED ORDER — POTASSIUM CHLORIDE CRYS ER 20 MEQ PO TBCR
40.0000 meq | EXTENDED_RELEASE_TABLET | Freq: Once | ORAL | Status: AC
  Administered 2021-02-03: 40 meq via ORAL
  Filled 2021-02-03: qty 2

## 2021-02-03 MED ORDER — METRONIDAZOLE 500 MG/100ML IV SOLN
500.0000 mg | Freq: Three times a day (TID) | INTRAVENOUS | Status: DC
  Administered 2021-02-03 (×2): 500 mg via INTRAVENOUS
  Filled 2021-02-03 (×5): qty 100

## 2021-02-03 NOTE — Progress Notes (Signed)
PROGRESS NOTE    Jessica Armstrong  RXV:[id number] DOB: 03/27/1946 DOA: 02/02/2021 PCP: Jerrol Banana., MD    Chief Complaint  Patient presents with  . Emesis  . Constipation    Brief Narrative:  Jessica Armstrong is a 75 y.o. female with medical history significant for hypertension, depression, diverticulosis, status post recent right lumbar 4 - 5 transforaminal lumbar interbody fusion which was done on 01/13/21 who presents to the emergency room for evaluation of lower abdominal pain mostly suprapubic and left lower quadrant associated with nausea, vomiting and poor oral intake.  Subjective:  No vomiting, still feeling nauseous Continue to have lower ab pain but less severe Had one bm this am No fever She wants to try eat something  Husband at bedside   Assessment & Plan:   Principal Problem:   Acute diverticulitis Active Problems:   Essential hypertension   GERD (gastroesophageal reflux disease)   Dehydration   Depression   Acute diverticulitis/constipation -Last colonoscopy  06/2020 -Continue antibiotics, IV hydration, antiemetics, stool softener  -Start clears, advance diet as tolerated  AKI on CKD 3 a -BUN 33/creatinine 1.54 on presentation -BUN 27/creatinine 1.09 after hydration -Baseline creatinine 0.9-1 -Repeat BMP in the morning Renal dosing medication  Hypokalemia Replace K, check mag,  Acute normocytic anemia -No overt sign of bleeding, dilutional? 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, herniated lumbar disc, radiculopathy, lumbago underwent rightL4/5 TLIF  Transforaminal lumbar interbody fusion (Right) -)on 01/13/2021 F/u with Dr Vertell Limber  Nutritional Assessment: The patient's BMI is: Body mass  index is 24.48 kg/m.Marland Kitchen       Unresulted Labs (From admission, onward)          Start     Ordered   02/09/21 0500  Creatinine, serum  (enoxaparin (LOVENOX)    CrCl >/= 30 ml/min)  Weekly,   STAT     Comments: while on enoxaparin therapy    02/02/21 1342   02/04/21 0500  CBC with Differential/Platelet  Tomorrow morning,   R        02/03/21 1510   02/04/21 5093  Basic metabolic panel  Tomorrow morning,   R        02/03/21 1510   02/04/21 0500  Magnesium  Tomorrow morning,   R        02/03/21 1510   02/02/21 1136  Urinalysis, Complete w Microscopic  ONCE - STAT,   STAT        02/02/21 1135            DVT prophylaxis: enoxaparin (LOVENOX) injection 40 mg Start: 02/02/21 2200   Code Status: full Family Communication: Husband at bedside Disposition:   Status is: Inpatient   Dispo: The patient is from: Home              Anticipated d/c is to: Home              Anticipated d/c date is: 24 to 48 hours, if able to tolerate diet advancement                Consultants:   None  Procedures:   None  Antimicrobials:   Anti-infectives (From admission, onward)   Start     Dose/Rate Route Frequency Ordered Stop  02/03/21 1600  metroNIDAZOLE (FLAGYL) IVPB 500 mg        500 mg 100 mL/hr over 60 Minutes Intravenous Every 8 hours 02/03/21 1001     02/02/21 1600  metroNIDAZOLE (FLAGYL) IVPB 500 mg  Status:  Discontinued        500 mg 100 mL/hr over 60 Minutes Intravenous Every 8 hours 02/02/21 1344 02/03/21 1001   02/02/21 1600  cefTRIAXone (ROCEPHIN) 2 g in sodium chloride 0.9 % 100 mL IVPB        2 g 200 mL/hr over 30 Minutes Intravenous Daily 02/02/21 1344            Objective: Vitals:   02/02/21 2007 02/02/21 2332 02/03/21 0344 02/03/21 0803  BP: (!) 152/67 (!) 142/48 (!) 152/50 135/76  Pulse: 66 66 70 77  Resp: 16 16 16 20   Temp: 97.9 F (36.6 C) 97.6 F (36.4 C) 97.9 F (36.6 C) 98.1 F (36.7 C)  TempSrc: Oral Oral Oral Oral  SpO2: 100% 100% 97% 98%   Weight:      Height:        Intake/Output Summary (Last 24 hours) at 02/03/2021 1514 Last data filed at 02/03/2021 1339 Gross per 24 hour  Intake 2771.21 ml  Output 200 ml  Net 2571.21 ml   Filed Weights   02/02/21 1131  Weight: 70.9 kg    Examination:  General exam: calm, NAD Respiratory system: Clear to auscultation. Respiratory effort normal. Cardiovascular system: S1 & S2 heard, RRR. No JVD, no murmur, No pedal edema. Gastrointestinal system: Abdomen is nondistended, soft , tender lower abdomen,  Normal bowel sounds heard. Well healed surgical scar ( remote h/o hysterectomy) Central nervous system: Alert and oriented. No focal neurological deficits. Extremities: Symmetric 5 x 5 power. Skin: No rashes, lesions or ulcers Psychiatry: Judgement and insight appear normal. Mood & affect appropriate.     Data Reviewed: I have personally reviewed following labs and imaging studies  CBC: Recent Labs  Lab 02/02/21 1133 02/03/21 0444  WBC 12.5* 9.2  HGB 12.3 9.9*  HCT 35.6* 29.2*  MCV 92.0 92.7  PLT 459* [id number]    Basic Metabolic Panel: Recent Labs  Lab 02/02/21 1133 02/03/21 0444  NA 139 137  K 3.5 3.3*  CL 103 105  CO2 21* 23  GLUCOSE 181* 104*  BUN 33* 27*  CREATININE 1.54* 1.09*  CALCIUM 10.0 8.7*    GFR: Estimated Creatinine Clearance: 42.7 mL/min (A) (by C-G formula based on SCr of 1.09 mg/dL (H)).  Liver Function Tests: Recent Labs  Lab 02/02/21 1133  AST 30  ALT 11  ALKPHOS 117  BILITOT 0.7  PROT 8.2*  ALBUMIN 4.5    CBG: No results for input(s): GLUCAP in the last 168 hours.   Recent Results (from the past 240 hour(s))  Resp Panel by RT-PCR (Flu A&B, Covid) Nasopharyngeal Swab     Status: None   Collection Time: 02/02/21  1:44 PM   Specimen: Nasopharyngeal Swab; Nasopharyngeal(NP) swabs in vial transport medium  Result Value Ref Range Status   SARS Coronavirus 2 by RT PCR NEGATIVE NEGATIVE Final    Comment: (NOTE) SARS-CoV-2 target  nucleic acids are NOT DETECTED.  The SARS-CoV-2 RNA is generally detectable in upper respiratory specimens during the acute phase of infection. The lowest concentration of SARS-CoV-2 viral copies this assay can detect is 138 copies/mL. A negative result does not preclude SARS-Cov-2 infection and should not be used as the sole basis for treatment or other patient management  decisions. A negative result may occur with  improper specimen collection/handling, submission of specimen other than nasopharyngeal swab, presence of viral mutation(s) within the areas targeted by this assay, and inadequate number of viral copies(<138 copies/mL). A negative result must be combined with clinical observations, patient history, and epidemiological information. The expected result is Negative.  Fact Sheet for Patients:  EntrepreneurPulse.com.au  Fact Sheet for Healthcare Providers:  IncredibleEmployment.be  This test is no t yet approved or cleared by the [place] FDA and  has been authorized for detection and/or diagnosis of SARS-CoV-2 by FDA under an Emergency Use Authorization (EUA). This EUA will remain  in effect (meaning this test can be used) for the duration of the COVID-19 declaration under Section 564(b)(1) of the Act, 21 U.S.C.section 360bbb-3(b)(1), unless the authorization is terminated  or revoked sooner.       Influenza A by PCR NEGATIVE NEGATIVE Final   Influenza B by PCR NEGATIVE NEGATIVE Final    Comment: (NOTE) The Xpert Xpress SARS-CoV-2/FLU/RSV plus assay is intended as an aid in the diagnosis of influenza from Nasopharyngeal swab specimens and should not be used as a sole basis for treatment. Nasal washings and aspirates are unacceptable for Xpert Xpress SARS-CoV-2/FLU/RSV testing.  Fact Sheet for Patients: EntrepreneurPulse.com.au  Fact Sheet for Healthcare  Providers: IncredibleEmployment.be  This test is not yet approved or cleared by the [place] FDA and has been authorized for detection and/or diagnosis of SARS-CoV-2 by FDA under an Emergency Use Authorization (EUA). This EUA will remain in effect (meaning this test can be used) for the duration of the COVID-19 declaration under Section 564(b)(1) of the Act, 21 U.S.C. section 360bbb-3(b)(1), unless the authorization is terminated or revoked.  Performed at [facility], [address] [place], [place] [address]          Radiology Studies: CT ABDOMEN PELVIS W CONTRAST  Result Date: 02/02/2021 CLINICAL DATA:  Bowel obstruction EXAM: CT ABDOMEN AND PELVIS WITH CONTRAST TECHNIQUE: Multidetector CT imaging of the abdomen and pelvis was performed using the standard protocol following bolus administration of intravenous contrast. CONTRAST:  66mL OMNIPAQUE IOHEXOL 300 MG/ML  SOLN COMPARISON:  05/15/2019 FINDINGS: Lower chest: No acute abnormality. Hepatobiliary: Cyst identified within the left hepatic lobe measures 1.2 cm. 9 mm cyst within inferior right hepatic lobe is also noted. No suspicious liver abnormality. Previous cholecystectomy. No bile duct dilatation. Pancreas: Unremarkable. No pancreatic ductal dilatation or surrounding inflammatory changes. Spleen: Normal in size without focal abnormality. Adrenals/Urinary Tract: Normal appearance of the adrenal glands. The right kidney appears unremarkable. Cyst within the anterolateral cortex of the left kidney measures 3.1 cm, image 23/2. Unchanged. Bladder unremarkable. Stomach/Bowel: The stomach appears normal. The appendix is visualized and is within normal limits. No small bowel wall thickening, inflammation or distension. No pathologic dilatation of the colon. There is extensive colonic diverticulosis involving the sigmoid colon. Mild soft tissue stranding of the around the sigmoid colon is noted without  significant bowel wall thickening. Equivocal for uncomplicated diverticulitis. Moderate stool burden noted throughout the colon. Vascular/Lymphatic: Aortic atherosclerosis. No abdominopelvic adenopathy. Reproductive: Status post hysterectomy. No adnexal masses. Other: Small amount of fluid/edema extends along the peritoneal reflection in the left pelvis. No discrete fluid collections. Musculoskeletal: Postoperative changes from L4-5 PLIF with interbody fusion. No acute or suspicious osseous findings. IMPRESSION: 1. Sigmoid diverticulosis. Mild soft tissue haziness of the around the sigmoid colon is noted along with a small amount of fluid along the left pelvic sidewall. Correlate for any clinical  signs or symptoms of acute diverticulitis. 2. Nonobstructive bowel gas pattern. 3. Aortic atherosclerosis. Aortic Atherosclerosis (ICD10-I70.0). Electronically Signed   By: Kerby Moors M.D.   On: 02/02/2021 12:48        Scheduled Meds: . aspirin EC  81 mg Oral Daily  . enoxaparin (LOVENOX) injection  40 mg Subcutaneous Q24H  . famotidine  20 mg Oral BID  . hydrALAZINE  50 mg Oral TID  . pantoprazole (PROTONIX) IV  40 mg Intravenous QAC breakfast  . PARoxetine  20 mg Oral Daily  . polyethylene glycol  17 g Oral Daily  . rosuvastatin  10 mg Oral Daily   Continuous Infusions: . sodium chloride 100 mL/hr at 02/03/21 1156  . cefTRIAXone (ROCEPHIN)  IV 2 g (02/02/21 1618)  . metronidazole       LOS: 1 day   Time spent: 42mins Greater than 50% of this time was spent in counseling, explanation of diagnosis, planning of further management, and coordination of care.   Voice Recognition Viviann Spare dictation system was used to create this note, attempts have been made to correct errors. Please contact the author with questions and/or clarifications.   Florencia Reasons, MD PhD FACP [facility]  Available via Epic secure chat 7am-7pm for nonurgent issues Please page for urgent issues To page the  attending provider between 7A-7P or the covering provider during after hours 7P-7A, please log into the web site [email or web address] and access using universal [place] password for that web site. If you do not have the password, please call the hospital operator.    02/03/2021, 3:14 PM

## 2021-02-04 LAB — CBC WITH DIFFERENTIAL/PLATELET
Abs Immature Granulocytes: 0.03 10*3/uL (ref 0.00–0.07)
Basophils Absolute: 0.1 10*3/uL (ref 0.0–0.1)
Basophils Relative: 1 %
Eosinophils Absolute: 0.1 10*3/uL (ref 0.0–0.5)
Eosinophils Relative: 1 %
HCT: 29.3 % — ABNORMAL LOW (ref 36.0–46.0)
Hemoglobin: 9.8 g/dL — ABNORMAL LOW (ref 12.0–15.0)
Immature Granulocytes: 0 %
Lymphocytes Relative: 16 %
Lymphs Abs: 1.2 10*3/uL (ref 0.7–4.0)
MCH: 31.3 pg (ref 26.0–34.0)
MCHC: 33.4 g/dL (ref 30.0–36.0)
MCV: 93.6 fL (ref 80.0–100.0)
Monocytes Absolute: 1 10*3/uL (ref 0.1–1.0)
Monocytes Relative: 13 %
Neutro Abs: 5.5 10*3/uL (ref 1.7–7.7)
Neutrophils Relative %: 69 %
Platelets: 272 10*3/uL (ref 150–400)
RBC: 3.13 MIL/uL — ABNORMAL LOW (ref 3.87–5.11)
RDW: 11.9 % (ref 11.5–15.5)
WBC: 7.9 10*3/uL (ref 4.0–10.5)
nRBC: 0 % (ref 0.0–0.2)

## 2021-02-04 LAB — BASIC METABOLIC PANEL
Anion gap: 12 (ref 5–15)
BUN: 16 mg/dL (ref 8–23)
CO2: 18 mmol/L — ABNORMAL LOW (ref 22–32)
Calcium: 8.8 mg/dL — ABNORMAL LOW (ref 8.9–10.3)
Chloride: 107 mmol/L (ref 98–111)
Creatinine, Ser: 0.94 mg/dL (ref 0.44–1.00)
GFR, Estimated: >60 mL/min (ref >60)
Glucose, Bld: 95 mg/dL (ref 70–99)
Potassium: 3.7 mmol/L (ref 3.5–5.1)
Sodium: 137 mmol/L (ref 135–145)

## 2021-02-04 LAB — MAGNESIUM: Magnesium: 1.5 mg/dL — ABNORMAL LOW (ref 1.7–2.4)

## 2021-02-04 MED ORDER — MAGNESIUM OXIDE -MG SUPPLEMENT 400 (240 MG) MG PO TABS
400.0000 mg | ORAL_TABLET | Freq: Once | ORAL | Status: AC
  Administered 2021-02-04: 400 mg via ORAL
  Filled 2021-02-04: qty 1

## 2021-02-04 MED ORDER — FLUCONAZOLE 150 MG PO TABS: 150.0000 mg | ORAL_TABLET | Freq: Once | ORAL | 1 refills | Status: AC

## 2021-02-04 MED ORDER — TRIAMTERENE-HCTZ 37.5-25 MG PO CAPS: 1.0000 | ORAL_CAPSULE | Freq: Every day | ORAL | 3 refills | Status: DC

## 2021-02-04 MED ORDER — POLYETHYLENE GLYCOL 3350 17 G PO PACK: 17.0000 g | PACK | Freq: Every day | ORAL | 0 refills | Status: DC

## 2021-02-04 MED ORDER — AMOXICILLIN-POT CLAVULANATE 875-125 MG PO TABS: 1.0000 | ORAL_TABLET | Freq: Two times a day (BID) | ORAL | 0 refills | Status: AC

## 2021-02-04 MED ORDER — MAGNESIUM SULFATE 2 GM/50ML IV SOLN
2.0000 g | Freq: Once | INTRAVENOUS | Status: DC
  Filled 2021-02-04: qty 50

## 2021-02-04 MED ORDER — AMOXICILLIN-POT CLAVULANATE 875-125 MG PO TABS
1.0000 | ORAL_TABLET | Freq: Two times a day (BID) | ORAL | Status: DC
  Administered 2021-02-04: 1 via ORAL
  Filled 2021-02-04: qty 1

## 2021-02-04 MED ORDER — MAGNESIUM OXIDE -MG SUPPLEMENT 400 (240 MG) MG PO TABS: 400.0000 mg | ORAL_TABLET | Freq: Every day | ORAL | 0 refills | Status: DC

## 2021-02-04 NOTE — Plan of Care (Signed)
Discharge teaching completed with patient who is in stable condition. 

## 2021-02-04 NOTE — Discharge Summary (Signed)
Discharge Summary  Jessica Armstrong [facility] PYP:[id number] DOB: [birth year]  PCP: Jerrol Banana., MD  Admit date: 02/02/2021 Discharge date: 02/04/2021  Time spent:  27mins  Recommendations for Outpatient Follow-up:  1. F/u with PCP within a week  for hospital discharge follow up, repeat cbc/bmp at follow up, PCP follow-up to monitor anemia and renal function    Discharge Diagnoses:  Active Hospital Problems   Diagnosis Date Noted  . Acute diverticulitis 02/02/2021  . Dehydration 02/02/2021  . Depression   . GERD (gastroesophageal reflux disease) 04/19/2015  . Essential hypertension 04/08/2013    Resolved Hospital Problems  No resolved problems to display.    Discharge Condition: stable  Diet recommendation: Regular diet  Filed Weights   02/02/21 1131  Weight: 70.9 kg    History of present illness: ( per admitting MD Dr Francine Graven) Chief Complaint: Abdominal pain                               Nausea/vomiting  HPI: Jessica Armstrong is a 75 y.o. female with medical history significant for hypertension, depression, diverticulosis, status post recent right lumbar 4 - 5 transforaminal lumbar interbody fusion which was done on 01/13/21 who presents to the emergency room for evaluation of lower abdominal pain mostly suprapubic and left lower quadrant associated with nausea, vomiting and poor oral intake.  She rates her abdominal pain an 8 x 10 in intensity at its worst.  She has had chills but denies having any fever. Patient states that since her surgery she feels she has been going downhill.  She has had persistent nausea and has been unable to tolerate any oral intake because the smell of food makes her sick.  She has also been unable to tolerate any liquids as well.  She has had constipation and despite using a suppository has not had a regular bowel movement.  She has been on opioid therapy for pain control. She denies having any chest pain, no shortness of breath, no headache, no  dizziness, no lightheadedness, no urinary frequency, no nocturia, no dysuria, no blurred vision, no headache, no palpitations, no diaphoresis. Labs show sodium 139, potassium 3.5, chloride 103, bicarb 21, glucose 181, BUN 33 compared to a baseline of 17, 2 weeks ago, serum creatinine 1.54 compared to baseline of 0.99, 2 weeks ago, calcium 10.0, alkaline phosphatase 117, albumin 4.5, lipase 36, AST 30, ALT 11, total protein 8.2, total bilirubin 0.7, white count 12.5, hemoglobin 12.3, hematocrit 35.6, MCV 90.0, RDW 12.1, platelet count 459 CT scan of abdomen and pelvis without contrast shows sigmoid diverticulosis. Mild soft tissue haziness  around the sigmoid colon is noted along with a small amount of fluid along the left pelvic sidewall. Correlate for any clinical signs or symptoms of acute diverticulitis. Nonobstructive bowel gas pattern. Aortic atherosclerosis. Twelve-lead EKG reviewed by me shows sinus rhythm with abnormal R wave progression.    ED Course: Patient is a 75 year old female who presents to the ER for evaluation of abdominal pain, nausea, vomiting and anorexia.  Patient is status post right lumbar 4 - 5 transforaminal lumbar interbody fusion which was done on 01/13/21.  She has been on opioids for pain control since her surgery. Labs show worsening of her renal function from baseline, serum creatinine 0.99 >> 1.54 and BUN 17 >> 33. She also has left lower quadrant tenderness with associated leukocytosis suggestive of acute diverticulitis. She will be admitted to the hospital  for further evaluation.  Hospital Course:  Principal Problem:   Acute diverticulitis Active Problems:   Essential hypertension   GERD (gastroesophageal reflux disease)   Dehydration   Depression   Acute diverticulitis/constipation -Last colonoscopy  06/2020 -She received Rocephin and Flagyl , IV hydration, antiemetics, stool softener  -Leukocytosis resolved, symptoms resolved, she had regular bowel  movement, tolerating regular diet, she desires to go home -She is discharged on Augmentin for 8 more days to finish total 10 days antibiotic treatment, (of note, she requested Diflucan for yeast infection ,she  states always gets yeast infection when take antibiotics)   AKI on CKD 3 a/azotemia -BUN 33/creatinine 1.54 on presentation -BUN 16/creatinine 0.9 at discharge -Renal dosing medication F/u with pcp  Hypokalemia/hypomagnesemia Replace K and mag, hold diuretic for 3 more days -Follow-up with PCP  Acute normocytic anemia -No overt sign of bleeding, dilutional? 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, herniated lumbar disc, radiculopathy, lumbago underwentrightL4/5 TLIF  Transforaminal lumbar interbody fusion (Right) -)on 01/13/2021 F/u with Dr Vertell Limber  Nutritional Assessment: The patient's BMI is: Body mass index is 24.48 kg/m.Marland Kitchen   DVT prophylaxis while in the hospital: enoxaparin (LOVENOX) injection 40 mg Start: 02/02/21 2200   Code Status: full Family Communication: Husband at bedside Disposition: home  Consultants:   None  Procedures:   None  Antimicrobials:    Anti-infectives (From admission, onward)   Start     Dose/Rate Route Frequency Ordered Stop   02/04/21 1045  amoxicillin-clavulanate (AUGMENTIN) 875-125 MG per tablet 1 tablet        1 tablet Oral Every 12 hours 02/04/21 0957     02/04/21 0000  fluconazole (DIFLUCAN) 150 MG tablet        150 mg Oral  Once 02/04/21 1004 02/04/21 2359   02/04/21 0000  amoxicillin-clavulanate (AUGMENTIN) 875-125 MG tablet        1 tablet Oral Every 12 hours 02/04/21 1054  02/12/21 2359   02/03/21 1600  metroNIDAZOLE (FLAGYL) IVPB 500 mg  Status:  Discontinued        500 mg 100 mL/hr over 60 Minutes Intravenous Every 8 hours 02/03/21 1001 02/04/21 0957   02/02/21 1600  metroNIDAZOLE (FLAGYL) IVPB 500 mg  Status:  Discontinued        500 mg 100 mL/hr over 60 Minutes Intravenous Every 8 hours 02/02/21 1344 02/03/21 1001   02/02/21 1600  cefTRIAXone (ROCEPHIN) 2 g in sodium chloride 0.9 % 100 mL IVPB  Status:  Discontinued        2 g 200 mL/hr over 30 Minutes Intravenous Daily 02/02/21 1344 02/04/21 0957         Discharge Exam: BP (!) 157/63 (BP Location: Right Arm)   Pulse 85   Temp 98.3 F (36.8 C) (Oral)   Resp 16   Ht 5\' 7"  (1.702 m)   Wt 70.9 kg   SpO2 99%   BMI 24.48 kg/m   General: NAD, very pleasant Cardiovascular: RRR Respiratory: Normal respiratory effort Abdomen: Soft, nontender, positive bowel sounds  Discharge Instructions You were cared for by a hospitalist during your hospital stay. If you have any questions about your discharge medications or the care you received while you  were in the hospital after you are discharged, you can call the unit and asked to speak with the hospitalist on call if the hospitalist that took care of you is not available. Once you are discharged, your primary care physician will handle any further medical issues. Please note that NO REFILLS for any discharge medications will be authorized once you are discharged, as it is imperative that you return to your primary care physician (or establish a relationship with a primary care physician if you do not have one) for your aftercare needs so that they can reassess your need for medications and monitor your lab values.  Discharge Instructions    Diet general   Complete by: As directed    Discharge wound care:   Complete by: As directed    Per neurosurgery recommendation   Increase activity slowly   Complete by: As directed      Allergies as of 02/04/2021       Reactions   Cholestyramine Nausea And Vomiting   Ciprofloxacin Nausea And Vomiting   Codeine Sulfate Nausea And Vomiting   Keflex [cephalexin] Nausea And Vomiting   Macrobid [nitrofurantoin Macrocrystal] Nausea And Vomiting   Sulfa Antibiotics Nausea And Vomiting, Rash      Medication List    STOP taking these medications   HYDROcodone-acetaminophen 5-325 MG tablet Commonly known as: NORCO/VICODIN   Molnupiravir 200 MG Caps     TAKE these medications   albuterol 108 (90 Base) MCG/ACT inhaler Commonly known as: VENTOLIN HFA Inhale 2 puffs into the lungs as needed for wheezing or shortness of breath.   amoxicillin-clavulanate 875-125 MG tablet Commonly known as: AUGMENTIN Take 1 tablet by mouth every 12 (twelve) hours for 8 days.   aspirin 81 MG EC tablet Take 81 mg by mouth daily.   diazepam 2 MG tablet Commonly known as: VALIUM TAKE ONE TABLET (2 MG) BY MOUTH EVERY 12HOURS AS NEEDED FOR ANXIETY What changed: See the new instructions.   famotidine 20 MG tablet Commonly known as: PEPCID TAKE ONE TABLET BY MOUTH TWICE DAILY   fluconazole 150 MG tablet Commonly known as: Diflucan Take 1 tablet (150 mg total) by mouth once for 1 dose. For yeast infection as needed   hydrALAZINE 50 MG tablet Commonly known as: APRESOLINE Take 50 mg by mouth 3 (three) times daily.   magnesium oxide 400 (240 Mg) MG Tabs Take 1 tablet (400 mg total) by mouth daily.   meclizine 25 MG tablet Commonly known as: ANTIVERT Take 1 tablet (25 mg total) by mouth 2 (two) times daily as needed for dizziness.   methocarbamol 500 MG tablet Commonly known as: ROBAXIN Take 1 tablet (500 mg total) by mouth every 6 (six) hours as needed for muscle spasms.   PARoxetine 20 MG tablet Commonly known as: PAXIL TAKE 1 TABLET BY MOUTH DAILY What changed: how much to take   polyethylene glycol 17 g packet Commonly known as: MIRALAX / GLYCOLAX Take 17 g by mouth daily.   rosuvastatin 10 MG  tablet Commonly known as: CRESTOR TAKE ONE TABLET EVERY DAY   triamterene-hydrochlorothiazide 37.5-25 MG capsule Commonly known as: DYAZIDE Take 1 each (1 capsule total) by mouth daily. Hold for three days, f/u with your heart doctor, please monitor your potassium level and magnesium level while taking this medication What changed: additional instructions            Discharge Care Instructions  (From admission, onward)         Start  Ordered   02/04/21 0000  Discharge wound care:       Comments: Per neurosurgery recommendation   02/04/21 1055         Allergies  Allergen Reactions  . Cholestyramine Nausea And Vomiting  . Ciprofloxacin Nausea And Vomiting  . Codeine Sulfate Nausea And Vomiting  . Keflex [Cephalexin] Nausea And Vomiting  . Macrobid [Nitrofurantoin Macrocrystal] Nausea And Vomiting  . Sulfa Antibiotics Nausea And Vomiting and Rash    Follow-up Information    Jerrol Banana., MD. Go on 02/07/2021.   Specialty: Family Medicine Why: 2:20pm appointment Contact information: [address] [place] [address] [phone]        f/u with your GI doctor to monitor diverticula.                The results of significant diagnostics from this hospitalization (including imaging, microbiology, ancillary and laboratory) are listed below for reference.    Significant Diagnostic Studies: DG Lumbar Spine 2-3 Views  Result Date: 01/13/2021 CLINICAL DATA:  L4-L5 interbody fusion. EXAM: DG C-ARM 1-60 MIN; LUMBAR SPINE - 2-3 VIEW FLUOROSCOPY TIME:  Fluoroscopy Time:  34 seconds. Radiation Exposure Index (if provided by the fluoroscopic device): 15.58 mGy. Number of Acquired Spot Images: 2. COMPARISON:  MRI lumbar spine October 16, 2020. FINDINGS: Two C-arm fluoroscopic images were obtained intraoperatively and submitted for post operative interpretation. These images demonstrate bilateral pedicle screws at L4 and L5. Intervening interbody spacer at  L4-L5. Minimal anterolisthesis of L4 on L5, similar to prior. Please see the performing provider's procedural report for further detail. IMPRESSION: Intraoperative fluoroscopy, as detailed above. Electronically Signed   By: Margaretha Sheffield MD   On: 01/13/2021 15:58   CT ABDOMEN PELVIS W CONTRAST  Result Date: 02/02/2021 CLINICAL DATA:  Bowel obstruction EXAM: CT ABDOMEN AND PELVIS WITH CONTRAST TECHNIQUE: Multidetector CT imaging of the abdomen and pelvis was performed using the standard protocol following bolus administration of intravenous contrast. CONTRAST:  29mL OMNIPAQUE IOHEXOL 300 MG/ML  SOLN COMPARISON:  05/15/2019 FINDINGS: Lower chest: No acute abnormality. Hepatobiliary: Cyst identified within the left hepatic lobe measures 1.2 cm. 9 mm cyst within inferior right hepatic lobe is also noted. No suspicious liver abnormality. Previous cholecystectomy. No bile duct dilatation. Pancreas: Unremarkable. No pancreatic ductal dilatation or surrounding inflammatory changes. Spleen: Normal in size without focal abnormality. Adrenals/Urinary Tract: Normal appearance of the adrenal glands. The right kidney appears unremarkable. Cyst within the anterolateral cortex of the left kidney measures 3.1 cm, image 23/2. Unchanged. Bladder unremarkable. Stomach/Bowel: The stomach appears normal. The appendix is visualized and is within normal limits. No small bowel wall thickening, inflammation or distension. No pathologic dilatation of the colon. There is extensive colonic diverticulosis involving the sigmoid colon. Mild soft tissue stranding of the around the sigmoid colon is noted without significant bowel wall thickening. Equivocal for uncomplicated diverticulitis. Moderate stool burden noted throughout the colon. Vascular/Lymphatic: Aortic atherosclerosis. No abdominopelvic adenopathy. Reproductive: Status post hysterectomy. No adnexal masses. Other: Small amount of fluid/edema extends along the peritoneal  reflection in the left pelvis. No discrete fluid collections. Musculoskeletal: Postoperative changes from L4-5 PLIF with interbody fusion. No acute or suspicious osseous findings. IMPRESSION: 1. Sigmoid diverticulosis. Mild soft tissue haziness of the around the sigmoid colon is noted along with a small amount of fluid along the left pelvic sidewall. Correlate for any clinical signs or symptoms of acute diverticulitis. 2. Nonobstructive bowel gas pattern. 3. Aortic atherosclerosis. Aortic Atherosclerosis (ICD10-I70.0). Electronically Signed   By:  Kerby Moors M.D.   On: 02/02/2021 12:48   DG BONE DENSITY (DXA)  Result Date: 01/10/2021 EXAM: DUAL X-RAY ABSORPTIOMETRY (DXA) FOR BONE MINERAL DENSITY IMPRESSION: Dear Dr. Vertell Limber, Your patient Militza Lovingood completed a BMD test on 01/10/2021 using the [place] (software version: 14.10) manufactured by [employer]. The following summarizes the results of our evaluation. Technologist: [facility] PATIENT BIOGRAPHICAL: Name: Charysse, Iseri Patient ID:  [id number] Birth Date: 01/18/1946 Height:     66.0 in. Gender:      Female Exam Date:  01/10/2021 Weight:     134.6 lbs. Indications: Advanced Age, Caucasian, Height Loss, Hysterectomy, Oophorectomy Bilateral, Postmenopausal Fractures: Treatments: Vitamin D DENSITOMETRY RESULTS: Site      Region     Measured Date Measured Age WHO Classification Young Adult T-score BMD         %Change vs. Previous Significant Change (*) AP Spine L1-L2 01[age]10/2019 76.5 Normal 0.2 1.196 g/cm2 -0.9% - AP Spine L1-L2 01/24/2018 73.5 Normal 0.3 1.207 g/cm2 - - DualFemur Total Left 01/10/2021 76.5 Osteopenia -1.8 0.779 g/cm2 -10.7% Yes DualFemur Total Left 01/24/2018 73.5 Osteopenia -1.1 0.872 g/cm2 - - DualFemur Total Mean 01/10/2021 76.5 Osteopenia -1.7 0.790 g/cm2 -11.3% Yes DualFemur Total Mean 01/24/2018 73.5 Normal -0.9 0.891 g/cm2 - - ASSESSMENT: The BMD measured at Femur Total Left is 0.779 g/cm2 with a T-score of -1.8. This  patient is considered OSTEOPENIC according to [facility]) criteria. The scan quality is good. L-3 & 4 was excluded due to degenerative changes. Compared with prior study, there has been no significant change in the spine. Compared with prior study, there has been significant decrease in the total hip. World Pharmacologist [facility]) criteria for post-menopausal, Caucasian Women: Normal:                   T-score at or above -1 SD Osteopenia/low bone mass: T-score between -1 and -2.5 SD Osteoporosis:             T-score at or below -2.5 SD RECOMMENDATIONS: 1. All patients should optimize calcium and vitamin D intake. 2. Consider FDA-approved medical therapies in postmenopausal women and men aged 37 years and older, based on the following: a. A hip or vertebral(clinical or morphometric) fracture b. T-score < -2.5 at the femoral neck or spine after appropriate evaluation to exclude secondary causes c. Low bone mass (T-score between -1.0 and -2.5 at the femoral neck or spine) and a 10-year probability of a hip fracture > 3% or a 10-year probability of a major osteoporosis-related fracture > 20% based on the [place]-adapted WHO algorithm 3. Clinician judgment and/or patient preferences may indicate treatment for people with 10-year fracture probabilities above or below these levels FOLLOW-UP: People with diagnosed cases of osteoporosis or at high risk for fracture should have regular bone mineral density tests. For patients eligible for Medicare, routine testing is allowed once every 2 years. The testing frequency can be increased to one year for patients who have rapidly progressing disease, those who are receiving or discontinuing medical therapy to restore bone mass, or have additional risk factors. I have reviewed this report, and agree with the above findings. Mark A. Thornton Papas, M.D. [facility] Your patient SHAW ALICE completed a FRAX assessment on 01/10/2021 using the [place] (analysis version: 14.10) manufactured by [employer]. The following summarizes the results of our evaluation. PATIENT BIOGRAPHICAL: Name: Ashia, Musico Patient ID: [id number] Birth Date: 05/04/1946 Height:  66.0 in. Gender:     Female    [age]       Weight:    134.6 lbs. Ethnicity:  White                            Exam Date: 01/10/2021 FRAX* RESULTS:  (version: 3.5) 10-year Probability of Fracture1 Major Osteoporotic Fracture2 Hip Fracture 12.0% 3.1% Population: [place] (Caucasian) Risk Factors: None Based on Femur (Right) Neck BMD 1 -The 10-year probability of fracture may be lower than reported if the patient has received treatment. 2 -Major Osteoporotic Fracture: Clinical Spine, Forearm, Hip or Shoulder *FRAX is a Materials engineer of the [employer] for Metabolic Bone Disease, a [place] (WHO) [facility]. ASSESSMENT: The probability of a major osteoporotic fracture is 12.0% within the next ten years. The probability of a hip fracture is 3.1% within the next ten years. I have reviewed this report and agree with the above findings. Mark A. Thornton Papas, M.D. [facility] Electronically Signed   By: Lavonia Dana M.D.   On: 01/10/2021 17:12   DG C-Arm 1-60 Min  Result Date: 01/13/2021 CLINICAL DATA:  L4-L5 interbody fusion. EXAM: DG C-ARM 1-60 MIN; LUMBAR SPINE - 2-3 VIEW FLUOROSCOPY TIME:  Fluoroscopy Time:  34 seconds. Radiation Exposure Index (if provided by the fluoroscopic device): 15.58 mGy. Number of Acquired Spot Images: 2. COMPARISON:  MRI lumbar spine October 16, 2020. FINDINGS: Two C-arm fluoroscopic images were obtained intraoperatively and submitted for post operative interpretation. These images demonstrate bilateral pedicle screws at L4 and L5. Intervening interbody spacer at L4-L5. Minimal anterolisthesis of L4 on L5, similar to prior. Please see the performing provider's procedural report for further detail. IMPRESSION:  Intraoperative fluoroscopy, as detailed above. Electronically Signed   By: Margaretha Sheffield MD   On: 01/13/2021 15:58    Microbiology: Recent Results (from the past 240 hour(s))  Resp Panel by RT-PCR (Flu A&B, Covid) Nasopharyngeal Swab     Status: None   Collection Time: 02/02/21  1:44 PM   Specimen: Nasopharyngeal Swab; Nasopharyngeal(NP) swabs in vial transport medium  Result Value Ref Range Status   SARS Coronavirus 2 by RT PCR NEGATIVE NEGATIVE Final    Comment: (NOTE) SARS-CoV-2 target nucleic acids are NOT DETECTED.  The SARS-CoV-2 RNA is generally detectable in upper respiratory specimens during the acute phase of infection. The lowest concentration of SARS-CoV-2 viral copies this assay can detect is 138 copies/mL. A negative result does not preclude SARS-Cov-2 infection and should not be used as the sole basis for treatment or other patient management decisions. A negative result may occur with  improper specimen collection/handling, submission of specimen other than nasopharyngeal swab, presence of viral mutation(s) within the areas targeted by this assay, and inadequate number of viral copies(<138 copies/mL). A negative result must be combined with clinical observations, patient history, and epidemiological information. The expected result is Negative.  Fact Sheet for Patients:  EntrepreneurPulse.com.au  Fact Sheet for Healthcare Providers:  IncredibleEmployment.be  This test is no t yet approved or cleared by the [place] FDA and  has been authorized for detection and/or diagnosis of SARS-CoV-2 by FDA under an Emergency Use Authorization (EUA). This EUA will remain  in effect (meaning this test can be used) for the duration of the COVID-19 declaration under Section 564(b)(1) of the Act, 21 U.S.C.section 360bbb-3(b)(1), unless the authorization is terminated  or revoked  sooner.       Influenza A by PCR NEGATIVE NEGATIVE  Final   Influenza B by PCR NEGATIVE NEGATIVE Final    Comment: (NOTE) The Xpert Xpress SARS-CoV-2/FLU/RSV plus assay is intended as an aid in the diagnosis of influenza from Nasopharyngeal swab specimens and should not be used as a sole basis for treatment. Nasal washings and aspirates are unacceptable for Xpert Xpress SARS-CoV-2/FLU/RSV testing.  Fact Sheet for Patients: EntrepreneurPulse.com.au  Fact Sheet for Healthcare Providers: IncredibleEmployment.be  This test is not yet approved or cleared by the [place] FDA and has been authorized for detection and/or diagnosis of SARS-CoV-2 by FDA under an Emergency Use Authorization (EUA). This EUA will remain in effect (meaning this test can be used) for the duration of the COVID-19 declaration under Section 564(b)(1) of the Act, 21 U.S.C. section 360bbb-3(b)(1), unless the authorization is terminated or revoked.  Performed at [facility], [address] [place], [place] [address]      Labs: Basic Metabolic Panel: Recent Labs  Lab 02/02/21 1133 02/03/21 0444 02/04/21 0508  NA 139 137 137  K 3.5 3.3* 3.7  CL 103 105 107  CO2 21* 23 18*  GLUCOSE 181* 104* 95  BUN 33* 27* 16  CREATININE 1.54* 1.09* 0.94  CALCIUM 10.0 8.7* 8.8*  MG  --   --  1.5*   Liver Function Tests: Recent Labs  Lab 02/02/21 1133  AST 30  ALT 11  ALKPHOS 117  BILITOT 0.7  PROT 8.2*  ALBUMIN 4.5   Recent Labs  Lab 02/02/21 1133  LIPASE 36   No results for input(s): AMMONIA in the last 168 hours. CBC: Recent Labs  Lab 02/02/21 1133 02/03/21 0444 02/04/21 0508  WBC 12.5* 9.2 7.9  NEUTROABS  --   --  5.5  HGB 12.3 9.9* 9.8*  HCT 35.6* 29.2* 29.3*  MCV 92.0 92.7 93.6  PLT 459* 309 272   Cardiac Enzymes: No results for input(s): CKTOTAL, CKMB, CKMBINDEX, TROPONINI in the last 168 hours. BNP: BNP (last 3 results) No results for input(s): BNP in the last 8760 hours.  ProBNP (last  3 results) No results for input(s): PROBNP in the last 8760 hours.  CBG: No results for input(s): GLUCAP in the last 168 hours.     Signed:  Florencia Reasons MD, PhD, FACP  [facility] 02/04/2021, 2:54 PM

## 2021-02-04 NOTE — Plan of Care (Signed)
Continuing with plan of care. 

## 2021-02-04 NOTE — Care Management Important Message (Signed)
Important Message  Patient Details  Name: Jessica Armstrong MRN: [id number] Date of Birth: 07-14-1946   Medicare Important Message Given:  Yes     Dannette Barbara 02/04/2021, 10:52 AM

## 2021-02-07 ENCOUNTER — Inpatient Hospital Stay: Payer: PPO | Admitting: Family Medicine

## 2021-02-21 ENCOUNTER — Other Ambulatory Visit: Payer: Self-pay

## 2021-02-21 ENCOUNTER — Ambulatory Visit (INDEPENDENT_AMBULATORY_CARE_PROVIDER_SITE_OTHER): Payer: PPO | Admitting: Family Medicine

## 2021-02-21 ENCOUNTER — Encounter: Payer: Self-pay | Admitting: Family Medicine

## 2021-02-21 VITALS — BP 133/64 | HR 71 | Temp 98.2°F | Resp 18 | Wt 128.0 lb

## 2021-02-21 DIAGNOSIS — M5441 Lumbago with sciatica, right side: Secondary | ICD-10-CM | POA: Diagnosis not present

## 2021-02-21 DIAGNOSIS — L309 Dermatitis, unspecified: Secondary | ICD-10-CM

## 2021-02-21 DIAGNOSIS — G4700 Insomnia, unspecified: Secondary | ICD-10-CM | POA: Diagnosis not present

## 2021-02-21 DIAGNOSIS — K219 Gastro-esophageal reflux disease without esophagitis: Secondary | ICD-10-CM

## 2021-02-21 DIAGNOSIS — G8929 Other chronic pain: Secondary | ICD-10-CM

## 2021-02-21 DIAGNOSIS — N179 Acute kidney failure, unspecified: Secondary | ICD-10-CM

## 2021-02-21 DIAGNOSIS — I5032 Chronic diastolic (congestive) heart failure: Secondary | ICD-10-CM | POA: Diagnosis not present

## 2021-02-21 DIAGNOSIS — K5790 Diverticulosis of intestine, part unspecified, without perforation or abscess without bleeding: Secondary | ICD-10-CM

## 2021-02-21 DIAGNOSIS — M5442 Lumbago with sciatica, left side: Secondary | ICD-10-CM

## 2021-02-21 DIAGNOSIS — R634 Abnormal weight loss: Secondary | ICD-10-CM

## 2021-02-21 NOTE — Progress Notes (Signed)
I,[name],acting as a scribe for Wilhemena Durie, MD.,have documented all relevant documentation on the behalf of Wilhemena Durie, MD,as directed by  Wilhemena Durie, MD while in the presence of Wilhemena Durie, MD.   Established patient visit   [name]   DOB: Dec 26, 1945   75 y.o. Female  MRN: [id number] Visit Date: 02/21/2021  Today's healthcare provider: Wilhemena Durie, MD   Chief Complaint  Patient presents with  . Hospitalization Follow-up   Subjective    HPI  Patient comes in today for follow-up for hospitalization for nausea vomiting diarrhea acute kidney injury secondary to the dehydration and thought to be secondary to possible diverticulitis. Also of note is that she was given narcotics after her neurosurgery which was entirely necessary but she has had significant problems with nausea and vomiting since and and is slowly getting over this.  She has lost evidently a total of about 30 pounds.  She is starting to feel better overall.  No abdominal pain, no recent nausea or vomiting, no blood in her stool.  No fever or chills recently. Follow up Hospitalization  Patient was admitted to [facility] on 02/02/2021 and discharged on 02/02/2021. She was treated for Emesis and Constipation. Treatment for this included; see notes in chart. Telephone follow up was done on none She reports good compliance with treatment. She reports this condition is improved.  --------------------------------------------------------------------  Patient states she has improved since Armstrong discharge. However, she still very little appetitive and indigestion. Patient states she is having alot insomnia still Armstrong stay.       Medications: Outpatient Medications Prior to Visit  Medication Sig  . albuterol (VENTOLIN HFA) 108 (90 Base) MCG/ACT inhaler Inhale 2 puffs into the lungs as needed for wheezing or shortness of breath.  . diazepam (VALIUM) 2 MG tablet TAKE  ONE TABLET (2 MG) BY MOUTH EVERY 12HOURS AS NEEDED FOR ANXIETY (Patient taking differently: Take 2 mg by mouth every 12 (twelve) hours as needed (TMJ).)  . famotidine (PEPCID) 20 MG tablet TAKE ONE TABLET BY MOUTH TWICE DAILY (Patient taking differently: Take 20 mg by mouth 2 (two) times daily.)  . hydrALAZINE (APRESOLINE) 50 MG tablet Take 50 mg by mouth 3 (three) times daily.  . meclizine (ANTIVERT) 25 MG tablet Take 1 tablet (25 mg total) by mouth 2 (two) times daily as needed for dizziness.  Marland Kitchen PARoxetine (PAXIL) 20 MG tablet TAKE 1 TABLET BY MOUTH DAILY (Patient taking differently: Take 10 mg by mouth daily.)  . rosuvastatin (CRESTOR) 10 MG tablet TAKE ONE TABLET EVERY DAY  . triamterene-hydrochlorothiazide (DYAZIDE) 37.5-25 MG capsule Take 1 each (1 capsule total) by mouth daily. Hold for three days, f/u with your heart doctor, please monitor your potassium level and magnesium level while taking this medication  . aspirin 81 MG EC tablet Take 81 mg by mouth daily. (Patient not taking: No sig reported)  . magnesium oxide 400 (240 Mg) MG TABS Take 1 tablet (400 mg total) by mouth daily. (Patient not taking: Reported on 02/21/2021)  . methocarbamol (ROBAXIN) 500 MG tablet Take 1 tablet (500 mg total) by mouth every 6 (six) hours as needed for muscle spasms. (Patient not taking: No sig reported)  . polyethylene glycol (MIRALAX / GLYCOLAX) 17 g packet Take 17 g by mouth daily. (Patient not taking: Reported on 02/21/2021)   No facility-administered medications prior to visit.    Review of Systems  Constitutional: Negative for appetite change, chills,  fatigue and fever.  Respiratory: Negative for chest tightness and shortness of breath.   Cardiovascular: Negative for chest pain and palpitations.  Gastrointestinal: Negative for abdominal pain, nausea and vomiting.  Neurological: Negative for dizziness and weakness.        Objective    BP 133/64 (BP Location: Right Arm, Patient Position: Sitting,  Cuff Size: Normal)   Pulse 71   Temp 98.2 F (36.8 C) (Oral)   Resp 18   Wt 128 lb (58.1 kg)   SpO2 97%   BMI 20.05 kg/m  BP Readings from Last 3 Encounters:  02/21/21 133/64  02/04/21 (!) 157/63  01/14/21 (!) 121/44   Wt Readings from Last 3 Encounters:  02/21/21 128 lb (58.1 kg)  02/02/21 156 lb 4.9 oz (70.9 kg)  01/13/21 133 lb (60.3 kg)       Physical Exam Vitals reviewed.  Constitutional:      Appearance: She is well-developed.     Comments: Patient looks fairly well but she has lost a good about her weight since her last visit  HENT:     Head: Normocephalic and atraumatic.     Right Ear: External ear normal.     Left Ear: External ear normal.  Eyes:     General: No scleral icterus.    Conjunctiva/sclera: Conjunctivae normal.  Cardiovascular:     Rate and Rhythm: Normal rate and regular rhythm.     Heart sounds: Normal heart sounds. No murmur heard.   Pulmonary:     Effort: Pulmonary effort is normal.     Breath sounds: Normal breath sounds.  Chest:  Breasts:     Right: Normal.     Left: Normal.    Abdominal:     Palpations: Abdomen is soft. There is no mass.     Tenderness: There is no guarding or rebound.  Genitourinary:    Vagina: Normal. No vaginal discharge.     Rectum: Guaiac result negative.  Musculoskeletal:        General: No tenderness. Normal range of motion.     Cervical back: Normal range of motion and neck supple.  Skin:    General: Skin is warm and dry.     Findings: No erythema or rash.  Neurological:     General: No focal deficit present.     Mental Status: She is alert and oriented to person, place, and time.     Cranial Nerves: No cranial nerve deficit.     Coordination: Coordination normal.  Psychiatric:        Mood and Affect: Mood normal.        Behavior: Behavior normal.        Thought Content: Thought content normal.        Judgment: Judgment normal.       No results found for any visits on 02/21/21.  Assessment &  Plan     1. Chronic low back pain with bilateral sciatica, unspecified back pain laterality Patient has had recent neurosurgery and she may need to follow-up with the surgeon.  2. Chronic diastolic CHF (congestive heart failure) ([place]) Clinically stable at this time.  3. AKI (acute kidney injury) ([place]) Follow-up labs.  I think this was due to acute dehydration - Comprehensive metabolic panel  4. Weight loss Multifactorial after neurosurgery with adverse reaction to narcotics and recent diverticulitis.  Mild malnutrition present - CBC with Differential/Platelet  5. Diverticulosis With diverticulitis.  Clinically this is resolved.  6. Insomnia, unspecified type Try increasing Paxil  from 10 to 20 mg daily.  Consider changing to mirtazapine if this does not help.  7. Dermatitis This may be a stage I sacral decubitus but now patient is up and about.  She was quite debilitated during her Armstrong time.  Due to the itching nature of it will treat as possible yeast. - clotrimazole-betamethasone (LOTRISONE) cream; Apply 1 application topically daily.  Dispense: 30 g; Refill: 0  8. Gastroesophageal reflux disease, unspecified whether esophagitis present  - omeprazole (PRILOSEC) 40 MG capsule; Take 1 capsule (40 mg total) by mouth daily.  Dispense: 90 capsule; Refill: 3   No follow-ups on file.      I, Wilhemena Durie, MD, have reviewed all documentation for this visit. The documentation on 02/24/21 for the exam, diagnosis, procedures, and orders are all accurate and complete.    Lanique Gonzalo Cranford Mon, MD  [facility] [phone] (phone) [phone] (fax)  [place]

## 2021-02-22 ENCOUNTER — Telehealth: Payer: Self-pay

## 2021-02-22 LAB — COMPREHENSIVE METABOLIC PANEL
ALT: 10 IU/L (ref 0–32)
AST: 18 IU/L (ref 0–40)
Albumin/Globulin Ratio: 1.7 (ref 1.2–2.2)
Albumin: 4.5 g/dL (ref 3.7–4.7)
Alkaline Phosphatase: 101 IU/L (ref 44–121)
BUN/Creatinine Ratio: 21 (ref 12–28)
BUN: 21 mg/dL (ref 8–27)
Bilirubin Total: 0.2 mg/dL (ref 0.0–1.2)
CO2: 20 mmol/L (ref 20–29)
Calcium: 9.8 mg/dL (ref 8.7–10.3)
Chloride: 105 mmol/L (ref 96–106)
Creatinine, Ser: 1.01 mg/dL — ABNORMAL HIGH (ref 0.57–1.00)
Globulin, Total: 2.7 g/dL (ref 1.5–4.5)
Glucose: 98 mg/dL (ref 65–99)
Potassium: 5 mmol/L (ref 3.5–5.2)
Sodium: 142 mmol/L (ref 134–144)
Total Protein: 7.2 g/dL (ref 6.0–8.5)
eGFR: 58 mL/min/{1.73_m2} — ABNORMAL LOW (ref >59)

## 2021-02-22 LAB — CBC WITH DIFFERENTIAL/PLATELET
Basophils Absolute: 0 10*3/uL (ref 0.0–0.2)
Basos: 0 %
EOS (ABSOLUTE): 0.1 10*3/uL (ref 0.0–0.4)
Eos: 2 %
Hematocrit: 32.1 % — ABNORMAL LOW (ref 34.0–46.6)
Hemoglobin: 10.6 g/dL — ABNORMAL LOW (ref 11.1–15.9)
Immature Grans (Abs): 0 10*3/uL (ref 0.0–0.1)
Immature Granulocytes: 0 %
Lymphocytes Absolute: 2.2 10*3/uL (ref 0.7–3.1)
Lymphs: 28 %
MCH: 31.3 pg (ref 26.6–33.0)
MCHC: 33 g/dL (ref 31.5–35.7)
MCV: 95 fL (ref 79–97)
Monocytes Absolute: 0.8 10*3/uL (ref 0.1–0.9)
Monocytes: 11 %
Neutrophils Absolute: 4.5 10*3/uL (ref 1.4–7.0)
Neutrophils: 59 %
Platelets: 307 10*3/uL (ref 150–450)
RBC: 3.39 x10E6/uL — ABNORMAL LOW (ref 3.77–5.28)
RDW: 12.6 % (ref 11.7–15.4)
WBC: 7.7 10*3/uL (ref 3.4–10.8)

## 2021-02-22 MED ORDER — OMEPRAZOLE 40 MG PO CPDR: 40.0000 mg | DELAYED_RELEASE_CAPSULE | Freq: Every day | ORAL | 3 refills | Status: DC

## 2021-02-22 MED ORDER — CLOTRIMAZOLE-BETAMETHASONE 1-0.05 % EX CREA: 1.0000 "application " | TOPICAL_CREAM | Freq: Every day | CUTANEOUS | 0 refills | Status: DC

## 2021-02-22 NOTE — Telephone Encounter (Signed)
Copied from [place] [phone] [phone]. Topic: General - Other >> Feb 22, 2021  9:07 AM Tessa Lerner A wrote: Reason for CRM: Patient would like to be contacted by a member of staff regarding potential prescriptions  Patient shares that the medications were discussed at their last visit with their PCP yesterday 02/21/21  Patient shares that they discussed multiple medications for concerns related to indigestion and a cream for a skin concern   Please contact to further advise when possible

## 2021-02-22 NOTE — Telephone Encounter (Signed)
Dr. Rosanna Randy please review. Forgot to mention yesterday that it was a contraindication with paroxetine and mirtazapine. It says that it increases SSRI effect. Ok to send?

## 2021-02-28 ENCOUNTER — Other Ambulatory Visit: Payer: Self-pay | Admitting: Family Medicine

## 2021-03-23 DIAGNOSIS — M5416 Radiculopathy, lumbar region: Secondary | ICD-10-CM | POA: Diagnosis not present

## 2021-04-05 ENCOUNTER — Other Ambulatory Visit: Payer: Self-pay | Admitting: Family Medicine

## 2021-05-03 DIAGNOSIS — R0602 Shortness of breath: Secondary | ICD-10-CM | POA: Diagnosis not present

## 2021-05-03 DIAGNOSIS — I10 Essential (primary) hypertension: Secondary | ICD-10-CM | POA: Diagnosis not present

## 2021-05-03 DIAGNOSIS — R55 Syncope and collapse: Secondary | ICD-10-CM | POA: Diagnosis not present

## 2021-05-03 DIAGNOSIS — E7800 Pure hypercholesterolemia, unspecified: Secondary | ICD-10-CM | POA: Diagnosis not present

## 2021-05-03 DIAGNOSIS — I5032 Chronic diastolic (congestive) heart failure: Secondary | ICD-10-CM | POA: Diagnosis not present

## 2021-05-23 DIAGNOSIS — M4316 Spondylolisthesis, lumbar region: Secondary | ICD-10-CM | POA: Diagnosis not present

## 2021-05-23 DIAGNOSIS — M5416 Radiculopathy, lumbar region: Secondary | ICD-10-CM | POA: Diagnosis not present

## 2021-05-23 DIAGNOSIS — M5441 Lumbago with sciatica, right side: Secondary | ICD-10-CM | POA: Diagnosis not present

## 2021-05-23 DIAGNOSIS — M5136 Other intervertebral disc degeneration, lumbar region: Secondary | ICD-10-CM | POA: Diagnosis not present

## 2021-06-12 ENCOUNTER — Other Ambulatory Visit: Payer: Self-pay | Admitting: Family Medicine

## 2021-06-12 NOTE — Telephone Encounter (Signed)
Requested Prescriptions  Pending Prescriptions Disp Refills  . PARoxetine (PAXIL) 20 MG tablet [Pharmacy Med Name: PAROXETINE HCL 20 MG TAB] 90 tablet 0    Sig: TAKE 1 TABLET BY MOUTH DAILY     Psychiatry:  Antidepressants - SSRI Passed - 06/12/2021 10:28 AM      Passed - Completed PHQ-2 or PHQ-9 in the last 360 days      Passed - Valid encounter within last 6 months    Recent Outpatient Visits          3 months ago Chronic low back pain with bilateral sciatica, unspecified back pain laterality   [facility] Jerrol Banana., MD   6 months ago Encounter for subsequent annual wellness visit (AWV) in Medicare patient   [facility] Jerrol Banana., MD   9 months ago Acute right-sided low back pain without sciatica   [place], [place]   11 months ago Acute right-sided low back pain without sciatica   [facility] Jerrol Banana., MD   1 year ago Fatigue, unspecified type   [facility] Jerrol Banana., MD      Future Appointments            In 5 months Jerrol Banana., MD [facility], [place]

## 2021-06-27 ENCOUNTER — Other Ambulatory Visit: Payer: Self-pay | Admitting: Gastroenterology

## 2021-07-07 ENCOUNTER — Other Ambulatory Visit: Payer: Self-pay | Admitting: Family Medicine

## 2021-07-07 DIAGNOSIS — Z1231 Encounter for screening mammogram for malignant neoplasm of breast: Secondary | ICD-10-CM

## 2021-08-01 ENCOUNTER — Other Ambulatory Visit: Payer: Self-pay

## 2021-08-01 ENCOUNTER — Ambulatory Visit (INDEPENDENT_AMBULATORY_CARE_PROVIDER_SITE_OTHER): Payer: PPO | Admitting: Family Medicine

## 2021-08-01 VITALS — BP 128/57 | HR 66 | Temp 98.2°F | Wt 134.0 lb

## 2021-08-01 DIAGNOSIS — F3341 Major depressive disorder, recurrent, in partial remission: Secondary | ICD-10-CM | POA: Diagnosis not present

## 2021-08-01 DIAGNOSIS — R634 Abnormal weight loss: Secondary | ICD-10-CM

## 2021-08-01 DIAGNOSIS — M4316 Spondylolisthesis, lumbar region: Secondary | ICD-10-CM

## 2021-08-01 DIAGNOSIS — K2100 Gastro-esophageal reflux disease with esophagitis, without bleeding: Secondary | ICD-10-CM

## 2021-08-01 DIAGNOSIS — I10 Essential (primary) hypertension: Secondary | ICD-10-CM

## 2021-08-01 DIAGNOSIS — E7800 Pure hypercholesterolemia, unspecified: Secondary | ICD-10-CM | POA: Diagnosis not present

## 2021-08-01 DIAGNOSIS — R5382 Chronic fatigue, unspecified: Secondary | ICD-10-CM | POA: Diagnosis not present

## 2021-08-01 DIAGNOSIS — F419 Anxiety disorder, unspecified: Secondary | ICD-10-CM

## 2021-08-01 DIAGNOSIS — I5032 Chronic diastolic (congestive) heart failure: Secondary | ICD-10-CM

## 2021-08-01 DIAGNOSIS — M654 Radial styloid tenosynovitis [de Quervain]: Secondary | ICD-10-CM

## 2021-08-01 MED ORDER — PREDNISONE 10 MG PO TABS: 10.0000 mg | ORAL_TABLET | Freq: Every day | ORAL | 0 refills | Status: DC

## 2021-08-01 MED ORDER — PREDNISONE 10 MG PO TABS
10.0000 mg | ORAL_TABLET | Freq: Every day | ORAL | 0 refills | Status: DC
Start: 2021-08-01 — End: 2021-11-21

## 2021-08-01 NOTE — Patient Instructions (Signed)
Wrist splint

## 2021-08-01 NOTE — Progress Notes (Signed)
Established patient visit   [name]   DOB: [birth year]   75 y.o. Female  MRN: [id number] Visit Date: 08/01/2021  Today's healthcare provider: Wilhemena Durie, MD   No chief complaint on file.  Subjective    HPI  Patient is a 75 year old female who presents to follow up on weight loss.  She states she has no appetite and has been losing weight since her surgery.  She has however gained some since the last time she was seen here. Overall she is feeling fairly well. He does have some recent right wrist pain which hurts to move her wrist laterally and hurts at the base of the thumb and up the radial side of the wrist.  No known trauma.   Wt Readings from Last 3 Encounters:  08/01/21 134 lb (60.8 kg)  02/21/21 128 lb (58.1 kg)  02/02/21 156 lb 4.9 oz (70.9 kg)   She is also concerned that since her surgery she has had some memory loss.  She feels it is minimal but it has been sudden and concerns her.  She has asked about the possibility of medication to take. Her MMSE today is 29/30  Her last complaint is that of right wrist pain.  She has had it for several weeks now.  She said it radiates into her arm and is especially painful at night when sleeping if she rolls over or it moves certain ways. She has had no known trauma    Medications: Outpatient Medications Prior to Visit  Medication Sig   albuterol (VENTOLIN HFA) 108 (90 Base) MCG/ACT inhaler Inhale 2 puffs into the lungs as needed for wheezing or shortness of breath.   aspirin 81 MG EC tablet Take 81 mg by mouth daily.   diazepam (VALIUM) 2 MG tablet TAKE ONE TABLET (2 MG) BY MOUTH EVERY 12HOURS AS NEEDED FOR ANXIETY (Patient taking differently: Take 2 mg by mouth every 12 (twelve) hours as needed (TMJ).)   famotidine (PEPCID) 20 MG tablet Take 1 tablet (20 mg total) by mouth 2 (two) times daily. **PLEASE SCHEDULE FOLLOW UP APPT**   hydrALAZINE (APRESOLINE) 50 MG tablet TAKE ONE TABLET 3 TIMES DAILY    meclizine (ANTIVERT) 25 MG tablet Take 1 tablet (25 mg total) by mouth 2 (two) times daily as needed for dizziness.   PARoxetine (PAXIL) 20 MG tablet TAKE 1 TABLET BY MOUTH DAILY   rosuvastatin (CRESTOR) 10 MG tablet TAKE ONE TABLET EVERY DAY   triamterene-hydrochlorothiazide (DYAZIDE) 37.5-25 MG capsule Take 1 each (1 capsule total) by mouth daily. Hold for three days, f/u with your heart doctor, please monitor your potassium level and magnesium level while taking this medication   clotrimazole-betamethasone (LOTRISONE) cream Apply 1 application topically daily. (Patient not taking: Reported on 08/01/2021)   magnesium oxide 400 (240 Mg) MG TABS Take 1 tablet (400 mg total) by mouth daily. (Patient not taking: No sig reported)   methocarbamol (ROBAXIN) 500 MG tablet Take 1 tablet (500 mg total) by mouth every 6 (six) hours as needed for muscle spasms. (Patient not taking: No sig reported)   omeprazole (PRILOSEC) 40 MG capsule Take 1 capsule (40 mg total) by mouth daily. (Patient not taking: Reported on 08/01/2021)   polyethylene glycol (MIRALAX / GLYCOLAX) 17 g packet Take 17 g by mouth daily. (Patient not taking: No sig reported)   No facility-administered medications prior to visit.    Review of Systems  Constitutional:  Negative for appetite  change.  Gastrointestinal:  Negative for abdominal pain, blood in stool, constipation, diarrhea, nausea and vomiting.  Musculoskeletal:  Arthralgias: right wrist.  Psychiatric/Behavioral:  Negative for confusion and decreased concentration.        Sudden change in memory       Objective    BP (!) 128/57 (BP Location: Right Arm, Patient Position: Sitting, Cuff Size: Normal)   Pulse 66   Temp 98.2 F (36.8 C) (Oral)   Wt 134 lb (60.8 kg)   SpO2 99%   BMI 20.99 kg/m  {Show previous vital signs (optional):23777}  Physical Exam Vitals reviewed.  Constitutional:      General: She is not in acute distress.    Appearance: She is well-developed.   HENT:     Head: Normocephalic and atraumatic.     Right Ear: Hearing normal.     Left Ear: Hearing normal.     Nose: Nose normal.  Eyes:     General: Lids are normal. No scleral icterus.       Right eye: No discharge.        Left eye: No discharge.     Conjunctiva/sclera: Conjunctivae normal.  Cardiovascular:     Rate and Rhythm: Normal rate and regular rhythm.     Heart sounds: Normal heart sounds.  Pulmonary:     Effort: Pulmonary effort is normal. No respiratory distress.  Musculoskeletal:        General: Tenderness present. No swelling, deformity or signs of injury.     Comments: No tenderness over the snuffbox of the right wrist but she does have some minimal tenderness along the base of the right thumb and in the extensor tendon area of the thumb.  Skin:    General: Skin is warm and dry.     Findings: No lesion or rash.  Neurological:     General: No focal deficit present.     Mental Status: She is alert and oriented to person, place, and time. Mental status is at baseline.  Psychiatric:        Mood and Affect: Mood normal.        Speech: Speech normal.        Behavior: Behavior normal.        Thought Content: Thought content normal.        Judgment: Judgment normal.      No results found for any visits on 08/01/21.  Assessment & Plan     1. De Quervain's tenosynovitis, right Patient might have arthritis or just tendinitis but I think specifically she has de Quervain's tenosynovitis.  Time put her in a wrist splint and try short prednisone burst to 5 days. May need referral to hand surgeon. - predniSONE (DELTASONE) 10 MG tablet; Take 1 tablet (10 mg total) by mouth daily with breakfast.  Dispense: 21 tablet; Refill: 0 - predniSONE (DELTASONE) 10 MG tablet; Take 1 tablet (10 mg total) by mouth daily with breakfast.  Dispense: 5 tablet; Refill: 0  2. Essential hypertension Controlled Dyazide and hydralazine  3. Chronic diastolic CHF (congestive heart failure)  ([place]) Niccoli stable and followed by cardiology  4. Gastroesophageal reflux disease with esophagitis without hemorrhage On omeprazole 40  5. Spondylolisthesis of lumbar region Followed by spine surgeon,  6. Hypercholesteremia On rosuvastatin  7. Chronic fatigue Improving  8. Recurrent major depressive disorder, in partial remission ([place]) On chronic paroxetine and doing well.  PHQ-9 and GAD-7 on next visit  9. Anxiety On paroxetine  10. Weight loss Clinically  this is improved.  Her weight is good at this time we will follow her    No follow-ups on file.      I, Wilhemena Durie, MD, have reviewed all documentation for this visit. The documentation on 08/06/21 for the exam, diagnosis, procedures, and orders are all accurate and complete.    Alli Jasmer Cranford Mon, MD  [facility] [phone] (phone) [phone] (fax)  [place]

## 2021-08-05 ENCOUNTER — Ambulatory Visit
Admission: RE | Admit: 2021-08-05 | Discharge: 2021-08-05 | Disposition: A | Discharge status: Home / Self Care | Payer: PPO | Source: Ambulatory Visit | Attending: Family Medicine | Admitting: Family Medicine

## 2021-08-05 ENCOUNTER — Other Ambulatory Visit: Payer: Self-pay

## 2021-08-05 DIAGNOSIS — Z1231 Encounter for screening mammogram for malignant neoplasm of breast: Secondary | ICD-10-CM | POA: Insufficient documentation

## 2021-08-12 ENCOUNTER — Telehealth: Payer: Self-pay | Admitting: *Deleted

## 2021-08-12 DIAGNOSIS — M25531 Pain in right wrist: Secondary | ICD-10-CM

## 2021-08-12 NOTE — Telephone Encounter (Signed)
Please advise 

## 2021-08-12 NOTE — Telephone Encounter (Signed)
Copied from [place] [id number]. Topic: General - Other >> Aug 12, 2021 11:14 AM Antonieta Iba C wrote: Reason for CRM: pt called in for assistance. Pt says that she was prescribed prednisone for her wrist pain. Pt says unfortunately the medication isn't helping. Pt would like to know if provider could place an order to have images. Pt says that she would like to know why her wrist is hurting.   [phone]  -

## 2021-08-15 NOTE — Telephone Encounter (Signed)
Patient called in again asking if Dr Rosanna Randy will place orders for her to get an Xray on her wrist. Was disappointed that she have not heard from anyone at the office. Patient was informed that Dr was not in on Friday and today have been a very busy day and short staffed she would like a call back today if possible. Can be reached at Ph# [phone]

## 2021-08-17 ENCOUNTER — Ambulatory Visit
Admission: RE | Admit: 2021-08-17 | Discharge: 2021-08-17 | Disposition: A | Discharge status: Home / Self Care | Payer: PPO | Source: Ambulatory Visit | Attending: Family Medicine | Admitting: Family Medicine

## 2021-08-17 ENCOUNTER — Other Ambulatory Visit: Payer: Self-pay

## 2021-08-17 ENCOUNTER — Ambulatory Visit
Admission: RE | Admit: 2021-08-17 | Discharge: 2021-08-17 | Disposition: A | Discharge status: Home / Self Care | Payer: PPO | Attending: Family Medicine | Admitting: Family Medicine

## 2021-08-17 DIAGNOSIS — M19031 Primary osteoarthritis, right wrist: Secondary | ICD-10-CM | POA: Diagnosis not present

## 2021-08-17 DIAGNOSIS — M25531 Pain in right wrist: Secondary | ICD-10-CM

## 2021-08-17 DIAGNOSIS — M189 Osteoarthritis of first carpometacarpal joint, unspecified: Secondary | ICD-10-CM | POA: Diagnosis not present

## 2021-08-18 ENCOUNTER — Telehealth: Payer: Self-pay | Admitting: Family Medicine

## 2021-08-18 NOTE — Telephone Encounter (Signed)
Results are not in chart yet.

## 2021-08-18 NOTE — Telephone Encounter (Signed)
Patient inquiring about imaging results due to wrist pain, patient states she does not want to go through the weekend in pain and would like a follow up call [phone]

## 2021-08-19 NOTE — Telephone Encounter (Signed)
Pt called in requesting her results, pt stated she feels like the office does not care about her and is upset that she is having trouble getting some assistance, pt stated she is having really bad wrist pain and does not want to go through another weekend in pain.

## 2021-08-19 NOTE — Telephone Encounter (Signed)
[facility] walkin recommended.

## 2021-08-19 NOTE — Telephone Encounter (Signed)
LMOVM for pt to return call. Okay for pec triage to give patient x-ray results.

## 2021-08-23 NOTE — Telephone Encounter (Signed)
Patient advised of x-ray result. Patient reports she is an established patient at [facility] and will call them if need to.

## 2021-10-11 ENCOUNTER — Other Ambulatory Visit: Payer: Self-pay | Admitting: Gastroenterology

## 2021-10-11 ENCOUNTER — Other Ambulatory Visit: Payer: Self-pay | Admitting: Family Medicine

## 2021-11-09 ENCOUNTER — Telehealth: Payer: Self-pay | Admitting: Family Medicine

## 2021-11-09 NOTE — Telephone Encounter (Signed)
Pt called reporting that she needs antibiotics prior to her dental work, says that he has done this several times before. Please advise  [facility] - [place], [place] - [address]  [address] [place] [address]  Phone: [phone] Fax: [phone]   She will be having dental work on the feb. 14th, please advise

## 2021-11-09 NOTE — Telephone Encounter (Signed)
Please advise 

## 2021-11-11 DIAGNOSIS — H40003 Preglaucoma, unspecified, bilateral: Secondary | ICD-10-CM | POA: Diagnosis not present

## 2021-11-15 NOTE — Telephone Encounter (Signed)
Pt called to remind the office she will need this antibiotic to have her dental work.  She would like to get it this week.

## 2021-11-16 ENCOUNTER — Other Ambulatory Visit: Payer: Self-pay | Admitting: *Deleted

## 2021-11-16 MED ORDER — AMOXICILLIN 500 MG PO CAPS: ORAL_CAPSULE | ORAL | 0 refills | Status: DC

## 2021-11-16 NOTE — Telephone Encounter (Addendum)
Rx was sent to pharmacy. Tried calling pt, line busy.

## 2021-11-18 NOTE — Progress Notes (Signed)
Annual Wellness Visit    I,Jessica Armstrong,acting as a scribe for Jessica Durie, MD.,have documented all relevant documentation on the behalf of Jessica Durie, MD,as directed by  Jessica Durie, MD while in the presence of Jessica Durie, MD.   [name], Female    DOB: Aug 30, 1946, 76 y.o.   MRN: [id number] Visit Date: 11/21/2021  Today's Provider: Wilhemena Durie, MD   Chief Complaint  Patient presents with   Medicare Wellness   Subjective    Jessica Armstrong is a 76 y.o. female who presents today for her Annual Wellness Visit.Annual Physical. She reports consuming a general and low sodium diet. Exercise is limited by age. She generally feels fairly well. She reports sleeping poorly. She does not have additional problems to discuss today.  She has insomnia and no appetite over the past couple of years.  Overall she is feeling well. HPI    Medications: Outpatient Medications Prior to Visit  Medication Sig   albuterol (VENTOLIN HFA) 108 (90 Base) MCG/ACT inhaler Inhale 2 puffs into the lungs as needed for wheezing or shortness of breath.   amoxicillin (AMOXIL) 500 MG capsule Take 4 Capsules 1 Hour Before Dental Procedure   aspirin 81 MG EC tablet Take 81 mg by mouth daily.   diazepam (VALIUM) 2 MG tablet TAKE ONE TABLET (2 MG) BY MOUTH EVERY 12HOURS AS NEEDED FOR ANXIETY (Patient taking differently: Take 2 mg by mouth every 12 (twelve) hours as needed (TMJ).)   famotidine (PEPCID) 20 MG tablet TAKE ONE TABLET BY MOUTH TWICE DAILY   gabapentin (NEURONTIN) 300 MG capsule    meclizine (ANTIVERT) 25 MG tablet Take 1 tablet (25 mg total) by mouth 2 (two) times daily as needed for dizziness.   omeprazole (PRILOSEC) 40 MG capsule Take 1 capsule (40 mg total) by mouth daily.   PARoxetine (PAXIL) 20 MG tablet TAKE 1 TABLET BY MOUTH DAILY   rosuvastatin (CRESTOR) 10 MG tablet TAKE ONE TABLET EVERY DAY   triamterene-hydrochlorothiazide (DYAZIDE) 37.5-25 MG  capsule TAKE 1 CAPSULE EVERY DAY   [DISCONTINUED] clotrimazole-betamethasone (LOTRISONE) cream Apply 1 application topically daily. (Patient not taking: Reported on 08/01/2021)   [DISCONTINUED] hydrALAZINE (APRESOLINE) 50 MG tablet TAKE ONE TABLET 3 TIMES DAILY (Patient not taking: Reported on 11/21/2021)   [DISCONTINUED] magnesium oxide 400 (240 Mg) MG TABS Take 1 tablet (400 mg total) by mouth daily. (Patient not taking: Reported on 02/21/2021)   [DISCONTINUED] methocarbamol (ROBAXIN) 500 MG tablet Take 1 tablet (500 mg total) by mouth every 6 (six) hours as needed for muscle spasms. (Patient not taking: Reported on 02/02/2021)   [DISCONTINUED] polyethylene glycol (MIRALAX / GLYCOLAX) 17 g packet Take 17 g by mouth daily. (Patient not taking: Reported on 02/21/2021)   [DISCONTINUED] predniSONE (DELTASONE) 10 MG tablet Take 1 tablet (10 mg total) by mouth daily with breakfast. (Patient not taking: Reported on 11/21/2021)   [DISCONTINUED] predniSONE (DELTASONE) 10 MG tablet Take 1 tablet (10 mg total) by mouth daily with breakfast. (Patient not taking: Reported on 11/21/2021)   No facility-administered medications prior to visit.    Allergies  Allergen Reactions   Cholestyramine Nausea And Vomiting   Ciprofloxacin Nausea And Vomiting   Codeine Sulfate Nausea And Vomiting   Hydromorphone    Keflex [Cephalexin] Nausea And Vomiting   Macrobid [Nitrofurantoin Macrocrystal] Nausea And Vomiting   Sulfa Antibiotics Nausea And Vomiting and Rash    Patient Care Team: Jerrol Banana., MD as  PCP - General (Family Medicine)  Review of Systems  Musculoskeletal:  Positive for back pain.  All other systems reviewed and are negative.  Last thyroid functions Lab Results  Component Value Date   TSH 2.310 11/21/2021        Objective    Vitals: BP 128/77 (BP Location: Right Arm, Patient Position: Sitting, Cuff Size: Normal)    Pulse 65    Temp 98.3 F (36.8 C) (Temporal)    Resp 16    Ht 5\' 7"   (1.702 m)    Wt 131 lb (59.4 kg)    SpO2 96%    BMI 20.52 kg/m  BP Readings from Last 3 Encounters:  11/21/21 128/77  08/01/21 (!) 128/57  02/21/21 133/64   Wt Readings from Last 3 Encounters:  11/21/21 131 lb (59.4 kg)  08/01/21 134 lb (60.8 kg)  02/21/21 128 lb (58.1 kg)      Physical Exam Vitals reviewed.  Constitutional:      Appearance: She is well-developed.  HENT:     Head: Normocephalic and atraumatic.     Right Ear: External ear normal.     Left Ear: External ear normal.  Eyes:     Conjunctiva/sclera: Conjunctivae normal.     Pupils: Pupils are equal, round, and reactive to light.  Cardiovascular:     Rate and Rhythm: Normal rate and regular rhythm.     Heart sounds: Normal heart sounds. No murmur heard. Pulmonary:     Effort: Pulmonary effort is normal.     Breath sounds: Normal breath sounds.  Chest:  Breasts:    Right: Normal.     Left: Normal.  Abdominal:     Palpations: Abdomen is soft. There is no mass.     Tenderness: There is no guarding or rebound.  Genitourinary:    Vagina: Normal. No vaginal discharge.     Rectum: Guaiac result negative.  Musculoskeletal:        General: No tenderness.     Cervical back: Normal range of motion and neck supple.     Right lower leg: No edema.     Left lower leg: No edema.  Skin:    General: Skin is warm and dry.     Findings: No erythema or rash.  Neurological:     General: No focal deficit present.     Mental Status: She is alert and oriented to person, place, and time.     Cranial Nerves: No cranial nerve deficit.     Coordination: Coordination normal.  Psychiatric:        Mood and Affect: Mood normal.        Behavior: Behavior normal.        Thought Content: Thought content normal.        Judgment: Judgment normal.     Most recent functional status assessment: In your present state of health, do you have any difficulty performing the following activities: 02/21/2021  Hearing? N  Vision? N   Difficulty concentrating or making decisions? N  Walking or climbing stairs? N  Dressing or bathing? N  Doing errands, shopping? N  Some recent data might be hidden   Most recent fall risk assessment: Fall Risk  02/21/2021  Falls in the past year? 0  Comment -  Number falls in past yr: 0  Comment -  Injury with Fall? 0  Risk for fall due to : -  Follow up Falls evaluation completed    Most recent depression screenings: [facility] 2/9  Scores 02/21/2021 11/16/2020  PHQ - 2 Score 0 1  PHQ- 9 Score 6 3   Most recent cognitive screening: 6CIT Screen 11/16/2020  What Year? 0 points  What month? 0 points  What time? 0 points  Count back from 20 0 points  Months in reverse 0 points  Repeat phrase 2 points  Total Score 2   Most recent Audit-C alcohol use screening Alcohol Use Disorder Test (AUDIT) 02/21/2021  1. How often do you have a drink containing alcohol? 1  2. How many drinks containing alcohol do you have on a typical day when you are drinking? 0  3. How often do you have six or more drinks on one occasion? 0  AUDIT-C Score 1  Alcohol Brief Interventions/Follow-up -   A score of 3 or more in women, and 4 or more in men indicates increased risk for alcohol abuse, EXCEPT if all of the points are from question 1   No results found for any visits on 11/21/21.  Assessment & Plan     Annual wellness visit done today including the all of the following: Reviewed patient's Family Medical History Reviewed and updated list of patient's medical providers Assessment of cognitive impairment was done Assessed patient's functional ability Established a written schedule for health screening [place] Completed and Reviewed  Exercise Activities and Dietary recommendations  Goals   None     Immunization History  Administered Date(s) Administered   Influenza Whole 07/09/2012   Influenza, High Dose Seasonal PF 07/11/2016, 08/06/2017, 07/17/2018, 07/28/2019, 07/14/2020    Influenza-Unspecified 09/10/2015, 07/17/2018, 07/18/2021   PFIZER(Purple Top)SARS-COV-2 Vaccination 11/21/2019, 12/17/2019, 08/02/2020   Pneumococcal Conjugate-13 12/07/2015   Pneumococcal Polysaccharide-23 11/23/2011   Zoster, Live 08/06/2013    Health Maintenance  Topic Date Due   Hepatitis C Screening  Never done   TETANUS/TDAP  Never done   Zoster Vaccines- Shingrix (1 of 2) Never done   COVID-19 Vaccine (4 - Booster for [place] series) 09/27/2020   COLONOSCOPY (Pts 45-27yrs Insurance coverage will need to be confirmed)  07/08/2024   Pneumonia Vaccine 69+ Years old  Completed   INFLUENZA VACCINE  Completed   DEXA SCAN  Completed   HPV VACCINES  Aged Out     Discussed health benefits of physical activity, and encouraged her to engage in regular exercise appropriate for her age and condition.    1. Encounter for subsequent annual wellness visit (AWV) in Medicare patient Present MOLST forms next year - Lipid panel - TSH - Hemoglobin A1c - CBC w/Diff/Platelet - Comprehensive Metabolic Panel (CMET)  2. Annual physical exam Up-to-date  3. Essential hypertension  - Lipid panel - TSH - Hemoglobin A1c - CBC w/Diff/Platelet - Comprehensive Metabolic Panel (CMET) - triamterene-hydrochlorothiazide (DYAZIDE) 37.5-25 MG capsule; Take 1 each (1 capsule total) by mouth daily.  Dispense: 90 capsule; Refill: 3  4. Hypercholesteremia  - Lipid panel - TSH - Hemoglobin A1c - CBC w/Diff/Platelet - Comprehensive Metabolic Panel (CMET) - rosuvastatin (CRESTOR) 10 MG tablet; Take 1 tablet (10 mg total) by mouth daily.  Dispense: 90 tablet; Refill: 3  5. Blood glucose elevated  - Lipid panel - TSH - Hemoglobin A1c - CBC w/Diff/Platelet - Comprehensive Metabolic Panel (CMET)  6. Chronic fatigue Stable - Lipid panel - TSH - Hemoglobin A1c - CBC w/Diff/Platelet - Comprehensive Metabolic Panel (CMET)  7. Chronic diastolic CHF (congestive heart failure) (HCC)  - Lipid  panel - TSH - Hemoglobin A1c - CBC w/Diff/Platelet - Comprehensive Metabolic Panel (  CMET)  8. Gastroesophageal reflux disease with esophagitis without hemorrhage  - Lipid panel - TSH - Hemoglobin A1c - CBC w/Diff/Platelet - Comprehensive Metabolic Panel (CMET)  9. Dizziness Infrequent - meclizine (ANTIVERT) 25 MG tablet; Take 1 tablet (25 mg total) by mouth 2 (two) times daily as needed for dizziness.  Dispense: 60 tablet; Refill: 2  10. Gastroesophageal reflux disease, unspecified whether esophagitis present  - omeprazole (PRILOSEC) 40 MG capsule; Take 1 capsule (40 mg total) by mouth daily.  Dispense: 90 capsule; Refill: 3  11. Mixed hyperlipidemia  - rosuvastatin (CRESTOR) 10 MG tablet; Take 1 tablet (10 mg total) by mouth daily.  Dispense: 90 tablet; Refill: 3  12. Insomnia, unspecified type Paxil and start Remeron 30 mg at bedtime.  13. Weight loss Weight loss is actually stabilized over the past year.  Flow the Remeron will help overall.    Return in about 3 months (around 02/18/2022).     I, Jessica Durie, MD, have reviewed all documentation for this visit. The documentation on 11/26/21 for the exam, diagnosis, procedures, and orders are all accurate and complete.    Elyn Krogh Cranford Mon, MD  [facility] [phone] (phone) [phone] (fax)  [place]

## 2021-11-21 ENCOUNTER — Ambulatory Visit (INDEPENDENT_AMBULATORY_CARE_PROVIDER_SITE_OTHER): Payer: PPO | Admitting: Family Medicine

## 2021-11-21 ENCOUNTER — Encounter: Payer: Self-pay | Admitting: Family Medicine

## 2021-11-21 ENCOUNTER — Other Ambulatory Visit: Payer: Self-pay

## 2021-11-21 VITALS — BP 128/77 | HR 65 | Temp 98.3°F | Resp 16 | Ht 67.0 in | Wt 131.0 lb

## 2021-11-21 DIAGNOSIS — I10 Essential (primary) hypertension: Secondary | ICD-10-CM

## 2021-11-21 DIAGNOSIS — R42 Dizziness and giddiness: Secondary | ICD-10-CM

## 2021-11-21 DIAGNOSIS — Z0000 Encounter for general adult medical examination without abnormal findings: Secondary | ICD-10-CM | POA: Diagnosis not present

## 2021-11-21 DIAGNOSIS — E7800 Pure hypercholesterolemia, unspecified: Secondary | ICD-10-CM | POA: Diagnosis not present

## 2021-11-21 DIAGNOSIS — R5382 Chronic fatigue, unspecified: Secondary | ICD-10-CM

## 2021-11-21 DIAGNOSIS — R739 Hyperglycemia, unspecified: Secondary | ICD-10-CM

## 2021-11-21 DIAGNOSIS — K2100 Gastro-esophageal reflux disease with esophagitis, without bleeding: Secondary | ICD-10-CM

## 2021-11-21 DIAGNOSIS — I5032 Chronic diastolic (congestive) heart failure: Secondary | ICD-10-CM

## 2021-11-21 DIAGNOSIS — R634 Abnormal weight loss: Secondary | ICD-10-CM

## 2021-11-21 DIAGNOSIS — G4700 Insomnia, unspecified: Secondary | ICD-10-CM

## 2021-11-21 DIAGNOSIS — K219 Gastro-esophageal reflux disease without esophagitis: Secondary | ICD-10-CM | POA: Diagnosis not present

## 2021-11-21 DIAGNOSIS — E782 Mixed hyperlipidemia: Secondary | ICD-10-CM

## 2021-11-21 MED ORDER — ROSUVASTATIN CALCIUM 10 MG PO TABS: 10.0000 mg | ORAL_TABLET | Freq: Every day | ORAL | 3 refills | Status: DC

## 2021-11-21 MED ORDER — FAMOTIDINE 20 MG PO TABS: 20.0000 mg | ORAL_TABLET | Freq: Two times a day (BID) | ORAL | 2 refills | Status: DC

## 2021-11-21 MED ORDER — PAROXETINE HCL 20 MG PO TABS: 20.0000 mg | ORAL_TABLET | Freq: Every day | ORAL | 0 refills | Status: DC

## 2021-11-21 MED ORDER — MIRTAZAPINE 15 MG PO TABS: 15.0000 mg | ORAL_TABLET | Freq: Every day | ORAL | 3 refills | Status: DC

## 2021-11-21 MED ORDER — OMEPRAZOLE 40 MG PO CPDR: 40.0000 mg | DELAYED_RELEASE_CAPSULE | Freq: Every day | ORAL | 3 refills | Status: DC

## 2021-11-21 MED ORDER — MECLIZINE HCL 25 MG PO TABS: 25.0000 mg | ORAL_TABLET | Freq: Two times a day (BID) | ORAL | 2 refills | Status: DC | PRN

## 2021-11-21 MED ORDER — TRIAMTERENE-HCTZ 37.5-25 MG PO CAPS: 1.0000 | ORAL_CAPSULE | Freq: Every day | ORAL | 3 refills | Status: DC

## 2021-11-21 NOTE — Patient Instructions (Signed)
STOP PAROXETINE!!  Health Maintenance After Age 78 After age 82, you are at a higher risk for certain long-term diseases and infections as well as injuries from falls. Falls are a major cause of broken bones and head injuries in people who are older than age 7. Getting regular preventive care can help to keep you healthy and well. Preventive care includes getting regular testing and making lifestyle changes as recommended by your health care provider. Talk with your health care provider about: Which screenings and tests you should have. A screening is a test that checks for a disease when you have no symptoms. A diet and exercise plan that is right for you. What should I know about screenings and tests to prevent falls? Screening and testing are the best ways to find a health problem early. Early diagnosis and treatment give you the best chance of managing medical conditions that are common after age 57. Certain conditions and lifestyle choices may make you more likely to have a fall. Your health care provider may recommend: Regular vision checks. Poor vision and conditions such as cataracts can make you more likely to have a fall. If you wear glasses, make sure to get your prescription updated if your vision changes. Medicine review. Work with your health care provider to regularly review all of the medicines you are taking, including over-the-counter medicines. Ask your health care provider about any side effects that may make you more likely to have a fall. Tell your health care provider if any medicines that you take make you feel dizzy or sleepy. Strength and balance checks. Your health care provider may recommend certain tests to check your strength and balance while standing, walking, or changing positions. Foot health exam. Foot pain and numbness, as well as not wearing proper footwear, can make you more likely to have a fall. Screenings, including: Osteoporosis screening. Osteoporosis is a  condition that causes the bones to get weaker and break more easily. Blood pressure screening. Blood pressure changes and medicines to control blood pressure can make you feel dizzy. Depression screening. You may be more likely to have a fall if you have a fear of falling, feel depressed, or feel unable to do activities that you used to do. Alcohol use screening. Using too much alcohol can affect your balance and may make you more likely to have a fall. Follow these instructions at home: Lifestyle Do not drink alcohol if: Your health care provider tells you not to drink. If you drink alcohol: Limit how much you have to: 0-1 drink a day for women. 0-2 drinks a day for men. Know how much alcohol is in your drink. In the [place] one drink equals one 12 oz bottle of beer (355 mL), one 5 oz glass of wine (148 mL), or one 1 oz glass of hard liquor (44 mL). Do not use any products that contain nicotine or tobacco. These products include cigarettes, chewing tobacco, and vaping devices, such as e-cigarettes. If you need help quitting, ask your health care provider. Activity  Follow a regular exercise program to stay fit. This will help you maintain your balance. Ask your health care provider what types of exercise are appropriate for you. If you need a cane or walker, use it as recommended by your health care provider. Wear supportive shoes that have nonskid soles. Safety  Remove any tripping hazards, such as rugs, cords, and clutter. Install safety equipment such as grab bars in bathrooms and safety rails on stairs. Keep  rooms and walkways well-lit. General instructions Talk with your health care provider about your risks for falling. Tell your health care provider if: You fall. Be sure to tell your health care provider about all falls, even ones that seem minor. You feel dizzy, tiredness (fatigue), or off-balance. Take over-the-counter and prescription medicines only as told by your health care  provider. These include supplements. Eat a healthy diet and maintain a healthy weight. A healthy diet includes low-fat dairy products, low-fat (lean) meats, and fiber from whole grains, beans, and lots of fruits and vegetables. Stay current with your vaccines. Schedule regular health, dental, and eye exams. Summary Having a healthy lifestyle and getting preventive care can help to protect your health and wellness after age 35. Screening and testing are the best way to find a health problem early and help you avoid having a fall. Early diagnosis and treatment give you the best chance for managing medical conditions that are more common for people who are older than age 35. Falls are a major cause of broken bones and head injuries in people who are older than age 26. Take precautions to prevent a fall at home. Work with your health care provider to learn what changes you can make to improve your health and wellness and to prevent falls. This information is not intended to replace advice given to you by your health care provider. Make sure you discuss any questions you have with your health care provider. Document Revised: 02/14/2021 Document Reviewed: 02/14/2021 Elsevier Patient Education  [place].

## 2021-11-22 LAB — COMPREHENSIVE METABOLIC PANEL
ALT: 12 IU/L (ref 0–32)
AST: 16 IU/L (ref 0–40)
Albumin/Globulin Ratio: 1.9 (ref 1.2–2.2)
Albumin: 4.8 g/dL — ABNORMAL HIGH (ref 3.7–4.7)
Alkaline Phosphatase: 89 IU/L (ref 44–121)
BUN/Creatinine Ratio: 25 (ref 12–28)
BUN: 26 mg/dL (ref 8–27)
Bilirubin Total: 0.4 mg/dL (ref 0.0–1.2)
CO2: 22 mmol/L (ref 20–29)
Calcium: 9.7 mg/dL (ref 8.7–10.3)
Chloride: 104 mmol/L (ref 96–106)
Creatinine, Ser: 1.03 mg/dL — ABNORMAL HIGH (ref 0.57–1.00)
Globulin, Total: 2.5 g/dL (ref 1.5–4.5)
Glucose: 114 mg/dL — ABNORMAL HIGH (ref 70–99)
Potassium: 4.7 mmol/L (ref 3.5–5.2)
Sodium: 142 mmol/L (ref 134–144)
Total Protein: 7.3 g/dL (ref 6.0–8.5)
eGFR: 56 mL/min/{1.73_m2} — ABNORMAL LOW (ref >59)

## 2021-11-22 LAB — LIPID PANEL
Chol/HDL Ratio: 2.3 ratio (ref 0.0–4.4)
Cholesterol, Total: 138 mg/dL (ref 100–199)
HDL: 60 mg/dL (ref >39)
LDL Chol Calc (NIH): 58 mg/dL (ref 0–99)
Triglycerides: 112 mg/dL (ref 0–149)
VLDL Cholesterol Cal: 20 mg/dL (ref 5–40)

## 2021-11-22 LAB — HEMOGLOBIN A1C
Est. average glucose Bld gHb Est-mCnc: 120 mg/dL
Hgb A1c MFr Bld: 5.8 % — ABNORMAL HIGH (ref 4.8–5.6)

## 2021-11-22 LAB — CBC WITH DIFFERENTIAL/PLATELET
Basophils Absolute: 0 10*3/uL (ref 0.0–0.2)
Basos: 1 %
EOS (ABSOLUTE): 0.2 10*3/uL (ref 0.0–0.4)
Eos: 2 %
Hematocrit: 36.1 % (ref 34.0–46.6)
Hemoglobin: 12.2 g/dL (ref 11.1–15.9)
Immature Grans (Abs): 0 10*3/uL (ref 0.0–0.1)
Immature Granulocytes: 0 %
Lymphocytes Absolute: 1.5 10*3/uL (ref 0.7–3.1)
Lymphs: 24 %
MCH: 31.7 pg (ref 26.6–33.0)
MCHC: 33.8 g/dL (ref 31.5–35.7)
MCV: 94 fL (ref 79–97)
Monocytes Absolute: 0.8 10*3/uL (ref 0.1–0.9)
Monocytes: 12 %
Neutrophils Absolute: 3.8 10*3/uL (ref 1.4–7.0)
Neutrophils: 61 %
Platelets: 254 10*3/uL (ref 150–450)
RBC: 3.85 x10E6/uL (ref 3.77–5.28)
RDW: 12.1 % (ref 11.7–15.4)
WBC: 6.4 10*3/uL (ref 3.4–10.8)

## 2021-11-22 LAB — TSH: TSH: 2.31 u[IU]/mL (ref 0.450–4.500)

## 2021-12-08 DIAGNOSIS — E7800 Pure hypercholesterolemia, unspecified: Secondary | ICD-10-CM | POA: Diagnosis not present

## 2021-12-08 DIAGNOSIS — I5032 Chronic diastolic (congestive) heart failure: Secondary | ICD-10-CM | POA: Diagnosis not present

## 2021-12-08 DIAGNOSIS — R55 Syncope and collapse: Secondary | ICD-10-CM | POA: Diagnosis not present

## 2021-12-08 DIAGNOSIS — R0602 Shortness of breath: Secondary | ICD-10-CM | POA: Diagnosis not present

## 2021-12-08 DIAGNOSIS — I10 Essential (primary) hypertension: Secondary | ICD-10-CM | POA: Diagnosis not present

## 2022-01-04 ENCOUNTER — Ambulatory Visit: Payer: Self-pay | Admitting: *Deleted

## 2022-01-04 NOTE — Telephone Encounter (Signed)
Summary: advice - medications  ? Pt was calling in to let find out if there was alternative medication to mirtazapine (REMERON) 15 MG tablet, pt stated it was working great for her appetite but "made her crazy" and changed her personality so she stopped taking this Rx but has no appetite again. Pt wanted to know if there was something else that would work that would not change her personality.  ? ?Pt also mentioned Dr Rosanna Randy normally calls in an antibiotic for her when she has a dental procedure, pt mentioned she also has heart failure and wanted Antibiotics sent in.  ?  ?  ?  ?  ? ? ? ?Chief Complaint: medication remeron caused sx of irritability, personality changes. Requesting alternative medication ?Symptoms: medication caused side effects patient does not like , see above. Reports it did help with appetite but now she has stopped taking medication and she is not sleeping or eating well.  ?Frequency: na ?Pertinent Negatives: Patient denies na ?Disposition: '[]'$ ED /'[]'$ Urgent Care (no appt availability in office) / '[]'$ Appointment(In office/virtual)/ '[]'$  [facility]/ '[]'$ Home Care/ '[]'$ Refused Recommended Disposition /'[]'$ [facility]/ '[x]'$  Follow-up with PCP ?Additional Notes:  ? ?Please advise if alternative medication can be prescribed without appt. Patient also requesting antibiotic to be ordered due to dental work will be done on 01/09/22.  ? ?Patient requesting a call back and requesting medications be sent to [facility] in the future.   ? ?Reason for Disposition ? Caller wants to use a complementary or alternative medicine ? ?Answer Assessment - Initial Assessment Questions ?1. NAME of MEDICATION: "What medicine are you calling about?" ?    Remeron 15 mg  ?2. QUESTION: "What is your question?" (e.g., double dose of medicine, side effect) ?    Can an alternative medication be prescribed. Side effects caused personality change, irritability.  ?3. PRESCRIBING HCP: "Who prescribed it?" Reason: if  prescribed by specialist, call should be referred to that group. ?    PCP ?4. SYMPTOMS: "Do you have any symptoms?" ?    Yes  ?5. SEVERITY: If symptoms are present, ask "Are they mild, moderate or severe?" ?    Moderate  ?6. PREGNANCY:  "Is there any chance that you are pregnant?" "When was your last menstrual period?" ?    na ? ?Protocols used: Medication Question Call-A-AH ? ?

## 2022-01-05 NOTE — Telephone Encounter (Signed)
Please advise 

## 2022-01-08 ENCOUNTER — Other Ambulatory Visit: Payer: Self-pay | Admitting: Gastroenterology

## 2022-01-09 ENCOUNTER — Other Ambulatory Visit: Payer: Self-pay | Admitting: *Deleted

## 2022-01-09 ENCOUNTER — Telehealth: Payer: Self-pay

## 2022-01-09 DIAGNOSIS — G4700 Insomnia, unspecified: Secondary | ICD-10-CM

## 2022-01-09 DIAGNOSIS — I10 Essential (primary) hypertension: Secondary | ICD-10-CM

## 2022-01-09 DIAGNOSIS — E782 Mixed hyperlipidemia: Secondary | ICD-10-CM

## 2022-01-09 DIAGNOSIS — K219 Gastro-esophageal reflux disease without esophagitis: Secondary | ICD-10-CM

## 2022-01-09 DIAGNOSIS — E7800 Pure hypercholesterolemia, unspecified: Secondary | ICD-10-CM

## 2022-01-09 DIAGNOSIS — R42 Dizziness and giddiness: Secondary | ICD-10-CM

## 2022-01-09 DIAGNOSIS — F419 Anxiety disorder, unspecified: Secondary | ICD-10-CM

## 2022-01-09 MED ORDER — ROSUVASTATIN CALCIUM 10 MG PO TABS: 10.0000 mg | ORAL_TABLET | Freq: Every day | ORAL | 3 refills | Status: AC

## 2022-01-09 MED ORDER — PAROXETINE HCL 20 MG PO TABS: 20.0000 mg | ORAL_TABLET | Freq: Every day | ORAL | 1 refills | Status: DC

## 2022-01-09 MED ORDER — AMOXICILLIN 500 MG PO CAPS: ORAL_CAPSULE | ORAL | 0 refills | Status: DC

## 2022-01-09 MED ORDER — OMEPRAZOLE 40 MG PO CPDR: 40.0000 mg | DELAYED_RELEASE_CAPSULE | Freq: Every day | ORAL | 3 refills | Status: DC

## 2022-01-09 MED ORDER — MECLIZINE HCL 25 MG PO TABS: 25.0000 mg | ORAL_TABLET | Freq: Two times a day (BID) | ORAL | 2 refills | Status: AC | PRN

## 2022-01-09 MED ORDER — TRIAMTERENE-HCTZ 37.5-25 MG PO CAPS: 1.0000 | ORAL_CAPSULE | Freq: Every day | ORAL | 3 refills | Status: DC

## 2022-01-09 MED ORDER — TRAZODONE HCL 50 MG PO TABS: ORAL_TABLET | ORAL | 1 refills | Status: DC

## 2022-01-09 NOTE — Addendum Note (Signed)
Addended by: Birdie Sons on: 01/09/2022 04:03 PM ? ? Modules accepted: Orders ? ?

## 2022-01-09 NOTE — Telephone Encounter (Signed)
Have sent amoxicillin prescription to her pharmacy.  ?She can change mirtazapine to trazodone '50mg'$  1-2 caps hs, #30, rf x 1.  ?

## 2022-01-09 NOTE — Telephone Encounter (Signed)
Returned call to patient and advised below. Patient willing to switch to trazodone 50 mg. Rx was sent to pharmacy. ?

## 2022-01-09 NOTE — Telephone Encounter (Signed)
This patient called last week on 01/04/22 about her Remeron making her "be someone she is not"  and wanted it switched.    No one has contacted her regarding this.   Also she was to get Amoxicillin called in for a dental procedure that she was suppose to have done today (01/09/22) but she had to cancel it because it wasn't done either.      Can you please do this either today or tomorrow for her??

## 2022-01-16 ENCOUNTER — Telehealth: Payer: Self-pay

## 2022-01-16 ENCOUNTER — Other Ambulatory Visit: Payer: Self-pay

## 2022-01-16 MED ORDER — PAXLOVID (300/100) 20 X 150 MG & 10 X 100MG PO TBPK
2.0000 | ORAL_TABLET | Freq: Two times a day (BID) | ORAL | 0 refills | Status: DC
  Filled 2022-01-16: qty 20, 5d supply, fill #0

## 2022-01-16 NOTE — Telephone Encounter (Signed)
Outpatient Pharmacy Oral COVID Treatment Note ? ?I connected with Jessica Armstrong on 01/16/2022/12:30 PM by telephone and verified that I am speaking with the correct person using two identifiers.  I discussed the limitations, risks, security, and privacy concerns of performing an evaluation and management service by telephone and the availability of in person appointments via referral to a physician. The patient expressed understanding and agreed to proceed. ? ?Pharmacy location: [facility] ? ?Diagnosis: COVID-19 infection ? ?Purpose of visit: Discussion of potential use of Paxlovid, a new treatment for mild to moderate COVID-19 viral infection in non-hospitalized patients. ? ?Subjective/Objective: Patient is a 76 y.o. female who is presenting with COVID 19 viral infection.  COVID 19 viral infection. Their symptoms began on 12/14/21 with weakness, cough, nausea.  The patient has confirmed COVID-19 via a home test on 01/16/22. ? ? ?Past Medical History:  ?Diagnosis Date  ? Allergies   ? Anemia   ? mild; no medical treatment  ? Anxiety   ? Arthritis   ? Carotid artery stenosis   ? a. doppler 10/7406: RICA 14-48%, LICA 1-85% f/u 1 year  ? Cervical spondyloarthritis   ? Chronic diastolic CHF (congestive heart failure) ([place])   ? a. echo 04/2015: EF 55-60%, no RWMA, mitral valve w/ systolic bowing w/o prolapse, PASP 53 mmHg  ? Claudication [facility])   ? Colon polyp   ? Cystitis   ? Depression   ? situational  ? Diverticulosis   ? Dizziness   ? Dyspnea   ? Dysrhythmia   ? bradycardia  ? GERD (gastroesophageal reflux disease)   ? H/O vertigo   ? Heart murmur   ? History of measles   ? History of mumps   ? Hypercholesterolemia   ? Hyperglycemia   ? Hypertension   ? vs hypotension  ? IBS (irritable bowel syndrome)   ? Need for SBE (subacute bacterial endocarditis) prophylaxis   ? Neuropathy   ? Palpitations   ? PONV (postoperative nausea and vomiting)   ? severe n/v after anesthesia  ? Post-menopausal   ?  Pulmonary valve dysplasia   ? Rotator cuff tear, right   ? Seasonal allergies   ? TMJ (dislocation of temporomandibular joint)   ? TMJ (temporomandibular joint syndrome)   ? Tricuspid regurgitation   ? a. echo 04/2013: EF 55-60%, nl wall motion, mild to mod TR, PASP 35 mm Hg  ? ? ? ?Allergies  ?Allergen Reactions  ? Cholestyramine Nausea And Vomiting  ? Ciprofloxacin Nausea And Vomiting  ? Codeine Sulfate Nausea And Vomiting  ? Hydromorphone   ? Keflex [Cephalexin] Nausea And Vomiting  ? Macrobid [Nitrofurantoin Macrocrystal] Nausea And Vomiting  ? Sulfa Antibiotics Nausea And Vomiting and Rash  ? ? ? ?Current Outpatient Medications:  ?  albuterol (VENTOLIN HFA) 108 (90 Base) MCG/ACT inhaler, Inhale 2 puffs into the lungs as needed for wheezing or shortness of breath., Disp: , Rfl:  ?  amoxicillin (AMOXIL) 500 MG capsule, Take 4 Capsules 1 Hour Before Dental Procedure, Disp: 4 capsule, Rfl: 0 ?  aspirin 81 MG EC tablet, Take 81 mg by mouth daily., Disp: , Rfl:  ?  diazepam (VALIUM) 2 MG tablet, TAKE ONE TABLET (2 MG) BY MOUTH EVERY 12HOURS AS NEEDED FOR ANXIETY (Patient taking differently: Take 2 mg by mouth every 12 (twelve) hours as needed (TMJ).), Disp: 60 tablet, Rfl: 2 ?  famotidine (PEPCID) 20 MG tablet, Take 1 tablet (20 mg total) by mouth 2 (two) times daily.,  Disp: 60 tablet, Rfl: 2 ?  gabapentin (NEURONTIN) 300 MG capsule, , Disp: , Rfl:  ?  meclizine (ANTIVERT) 25 MG tablet, Take 1 tablet (25 mg total) by mouth 2 (two) times daily as needed for dizziness., Disp: 60 tablet, Rfl: 2 ?  nirmatrelvir & ritonavir (PAXLOVID, 300/100,) 20 x 150 MG & 10 x $Re'100MG'RZf$  TBPK, Take 2 tablets 1 tablet of $RemoveB'150mg'qVXkZoLu$  nirmatrelvir and 1 tablet of 100 mg ritonavir) by mouth 2 (two) times daily., Disp: 20 tablet, Rfl: 0 ?  omeprazole (PRILOSEC) 40 MG capsule, Take 1 capsule (40 mg total) by mouth daily., Disp: 90 capsule, Rfl: 3 ?  PARoxetine (PAXIL) 20 MG tablet, Take 1 tablet (20 mg total) by mouth daily., Disp: 90 tablet, Rfl: 1 ?   rosuvastatin (CRESTOR) 10 MG tablet, Take 1 tablet (10 mg total) by mouth daily., Disp: 90 tablet, Rfl: 3 ?  traZODone (DESYREL) 50 MG tablet, Take 1-2 Tablets At bedtimes AS Needed, Disp: 30 tablet, Rfl: 1 ?  triamterene-hydrochlorothiazide (DYAZIDE) 37.5-25 MG capsule, Take 1 each (1 capsule total) by mouth daily., Disp: 90 capsule, Rfl: 3 ? ?Lab Monitoring: eGFR 56 ? ?Drug Interactions Noted: Rosuvastatin - pt instructed to hold. ? ?Plan: ? ?This patient is a 76 y.o. female that meets the criteria for Emergency Use Authorization of Paxlovid. After reviewing the emergency use authorization with the patient, the patient agrees to receive Paxlovid. ? ?Through FDA guidance and current [facility] standing order Paxlovid will be prescribed to the patient.  ? ?Patient contacted for counseling on 01/16/22 and verbalized understanding.  ? ?Delivery or Pick-Up Date: 01/16/22 ? ?Follow up instructions:  ?  ?Take prescription BID x 5 days as directed ?Counseling was provided by pharmacist. Reach out to pharmacist with follow up questions ?For concerns regarding further COVID symptoms please follow up with your PCP or urgent care ?For urgent or life-threatening issues, seek care at your local emergency department ? ? ?[name] J ?01/16/2022, 12:30 PM ?[place] Pharmacist ?Phone# [phone]  ?

## 2022-01-19 ENCOUNTER — Ambulatory Visit: Payer: Self-pay | Admitting: *Deleted

## 2022-01-19 ENCOUNTER — Telehealth: Payer: Self-pay | Admitting: Family Medicine

## 2022-01-19 NOTE — Telephone Encounter (Signed)
? ?  Summary: sore throat  ? Pt has covid and would like to know if she can be prescribed something for her her sore throat.  She states she was to let Jiles Garter know how she was doing.  She has a terrible sore throat.   ?  ? ? ?Chief Complaint: sore throat recent dx positive for covid  ?Symptoms: pain with swallowing , difficulty eating regular foods, nasal drainage ?Frequency: since dx covid  ?Pertinent Negatives: Patient denies chest  pain difficulty breathing difficulty swallowing no drooling.no fever  ?Disposition: '[]'$ ED /'[]'$ Urgent Care (no appt availability in office) / '[]'$ Appointment(In office/virtual)/ '[]'$  [place] [facility]/ '[]'$ Home Care/ '[]'$ Refused Recommended Disposition /'[]'$ [place] [facility]/ '[x]'$  Follow-up with PCP ?Additional Notes:  ? ?Calling back to request medication for sore throat. Patient would like for Jiles Garter to know she appreciates all she has done helping her.   ? ? ? ? ? ? ? ?Reason for Disposition ? [1] Sore throat with cough/cold symptoms AND [2] present > 5 days ?   Positive covid ? ?Answer Assessment - Initial Assessment Questions ?1. ONSET: "When did the throat start hurting?" (Hours or days ago)  ?    With covid  ?2. SEVERITY: "How bad is the sore throat?" (Scale 1-10; mild, moderate or severe) ?  - MILD (1-3):  doesn't interfere with eating or normal activities ?  - MODERATE (4-7): interferes with eating some solids and normal activities ?  - SEVERE (8-10):  excruciating pain, interferes with most normal activities ?  - SEVERE DYSPHAGIA: can't swallow liquids, drooling ?    moderate ?3. STREP EXPOSURE: "Has there been any exposure to strep within the past week?" If Yes, ask: "What type of contact occurred?"  ?    No  dx covid  ?4.  VIRAL SYMPTOMS: "Are there any symptoms of a cold, such as a runny nose, cough, hoarse voice or red eyes?"  ?    Covid sx. Nasal drainage  ?5. FEVER: "Do you have a fever?" If Yes, ask: "What is your temperature, how was it measured, and when did it start?" ?     No  ?6. PUS ON THE TONSILS: "Is there pus on the tonsils in the back of your throat?" ?    na ?7. OTHER SYMPTOMS: "Do you have any other symptoms?" (e.g., difficulty breathing, headache, rash) ?    Sore throat pain with swallowing ?8. PREGNANCY: "Is there any chance you are pregnant?" "When was your last menstrual period?" ?    na ? ?Protocols used: Sore Throat-A-AH ? ?

## 2022-01-19 NOTE — Telephone Encounter (Signed)
error 

## 2022-02-05 ENCOUNTER — Other Ambulatory Visit: Payer: Self-pay | Admitting: Gastroenterology

## 2022-02-07 ENCOUNTER — Other Ambulatory Visit (HOSPITAL_COMMUNITY): Payer: Self-pay

## 2022-02-07 NOTE — Addendum Note (Signed)
Addended by: Matilde Sprang on: 02/07/2022 03:35 PM ? ? Modules accepted: Orders ? ?

## 2022-02-07 NOTE — Telephone Encounter (Signed)
Requested medication (s) are due for refill today: yes ? ?Requested medication (s) are on the active medication list: yes ? ?Last refill:  famotidine 11/21/21 #60/2, gabapentin 11/11/21 ? ?Future visit scheduled: yes ? ?Notes to clinic:  gabapentin is historical med, pt has appt coming up on 03/01/22. No protocol attached to rxs ? ? ?  ?Requested Prescriptions  ?Pending Prescriptions Disp Refills  ? gabapentin (NEURONTIN) 300 MG capsule    ?  ? There is no refill protocol information for this order  ?  ? famotidine (PEPCID) 20 MG tablet 60 tablet 2  ?  Sig: Take 1 tablet (20 mg total) by mouth 2 (two) times daily.  ?  ? There is no refill protocol information for this order  ?  ?Refused Prescriptions Disp Refills  ? famotidine (PEPCID) 20 MG tablet [Pharmacy Med Name: FAMOTIDINE 20 MG TAB[*]] 60 tablet 0  ?  Sig: TAKE ONE TABLET BY MOUTH TWICE A DAY  ?  ? There is no refill protocol information for this order  ?  ? ?

## 2022-02-07 NOTE — Telephone Encounter (Signed)
Pt falling up on refill request for ? ?famotidine (PEPCID) 20 MG tablet ?BID ?90 day ? ?gabapentin (NEURONTIN) 300 MG capsule ?90 day ? ?[facility] [address] [facility] - [place], [place] - [address] ?  ?(This was set to Dr Allen Norris) ? ?Pt states at last appointment Dr Rosanna Randy was to fill ?

## 2022-02-08 ENCOUNTER — Telehealth: Payer: Self-pay | Admitting: Family Medicine

## 2022-02-08 ENCOUNTER — Other Ambulatory Visit: Payer: Self-pay | Admitting: Gastroenterology

## 2022-02-08 MED ORDER — FAMOTIDINE 20 MG PO TABS: 20.0000 mg | ORAL_TABLET | Freq: Two times a day (BID) | ORAL | 2 refills | Status: DC

## 2022-02-08 NOTE — Telephone Encounter (Signed)
[facility] faxed refill request for the following medications: ? ?famotidine (PEPCID) 20 MG tablet  ? ?Please advise. ? ?

## 2022-02-12 IMAGING — MG DIGITAL SCREENING BILAT W/ TOMO W/ CAD
6 of 10 series · 6 of 30 positions shown · non-contrast
Comparison: Previous exam(s).

CLINICAL DATA: Screening.

EXAM:
DIGITAL SCREENING BILATERAL MAMMOGRAM WITH TOMO AND CAD

[L MLO synth-2D (1 of 2)]
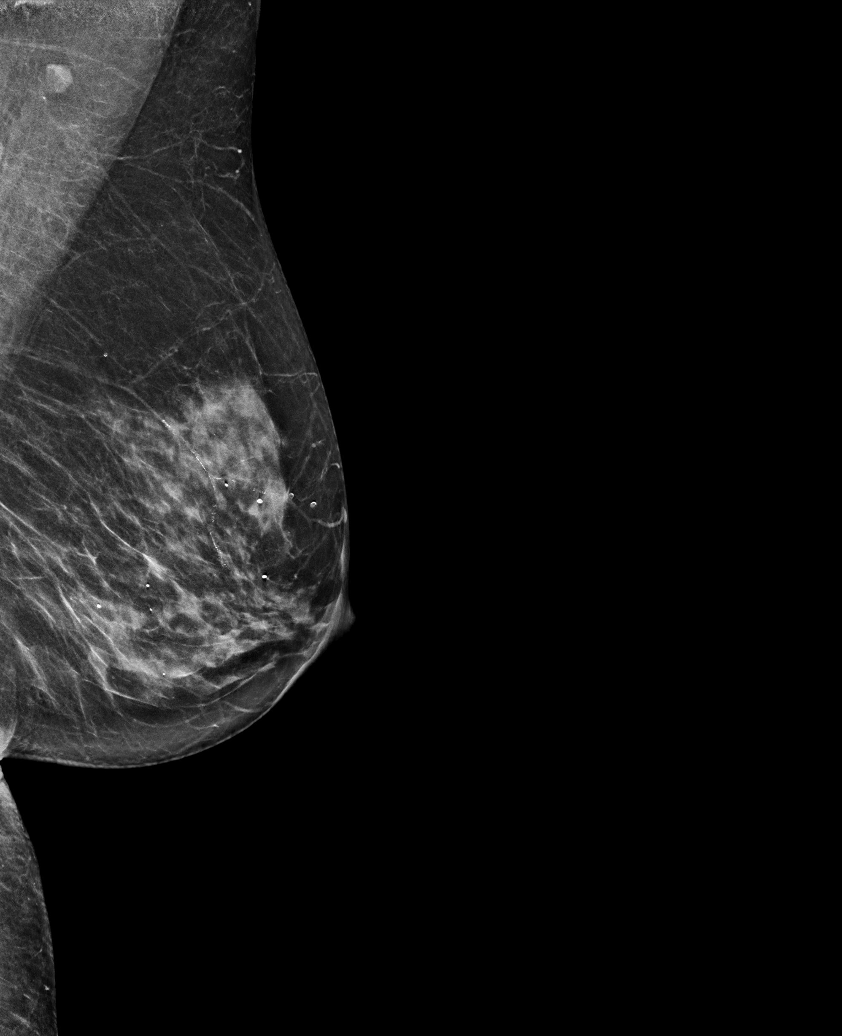

[L MLO synth-2D (2 of 2)]
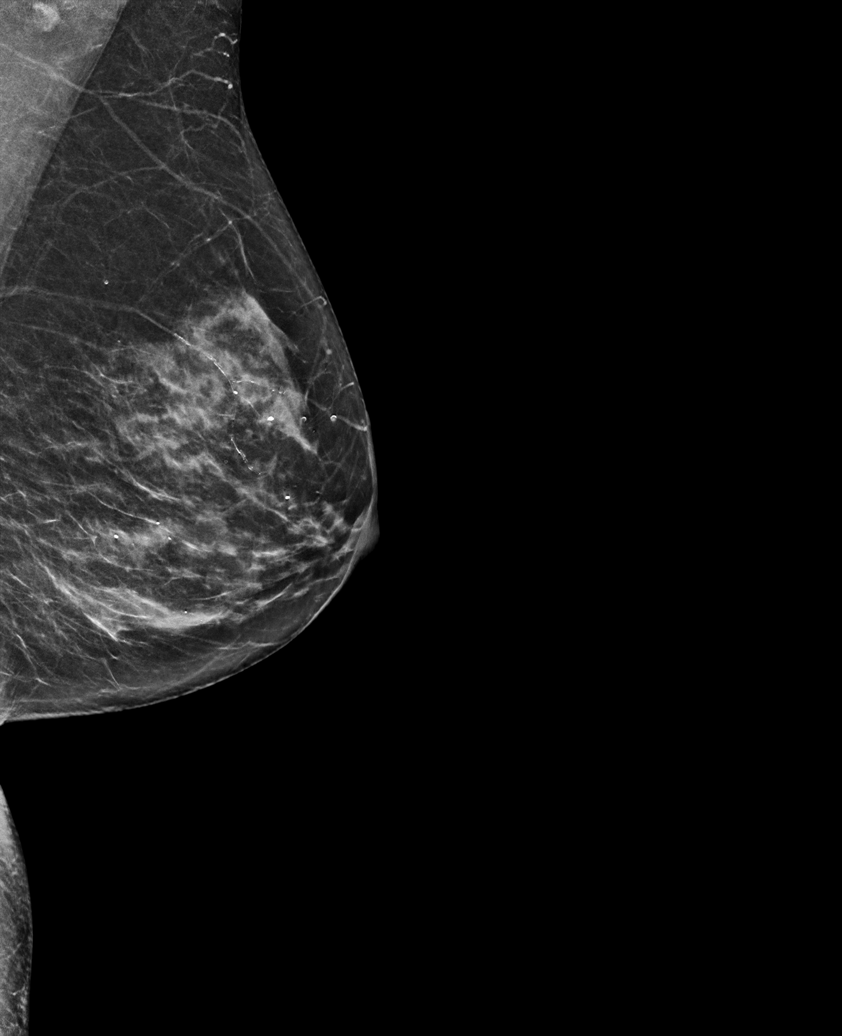

[R MLO synth-2D]
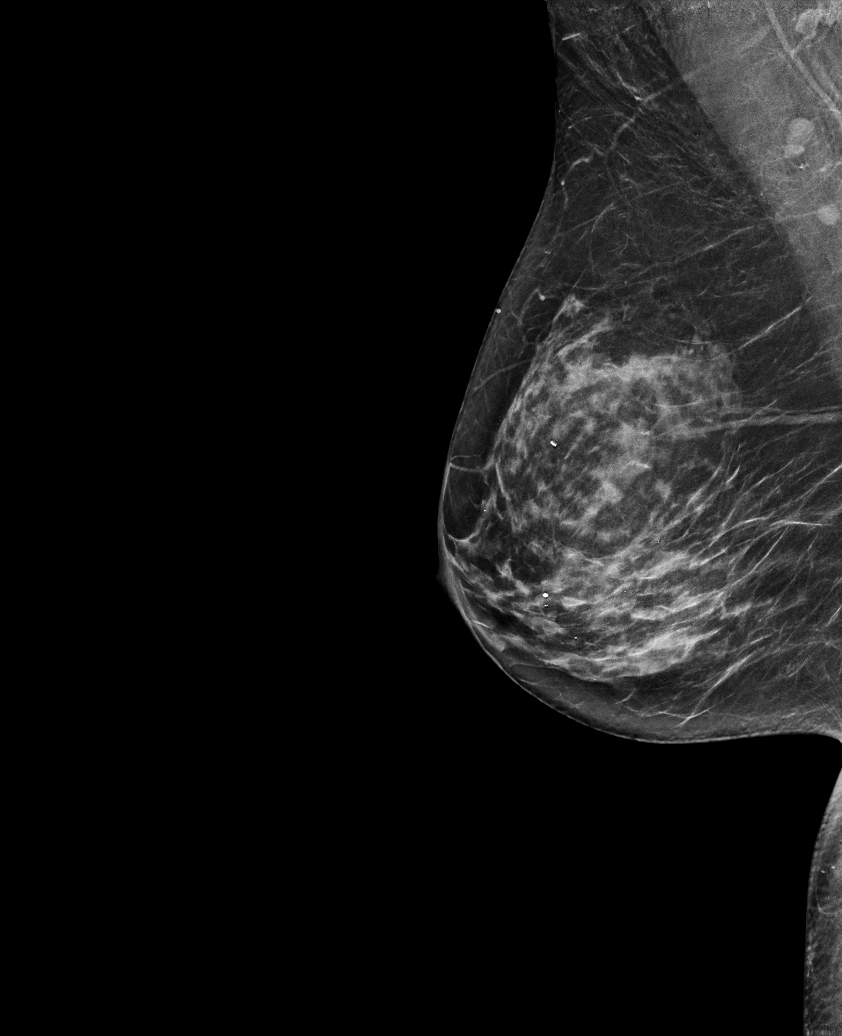

[L CC synth-2D]
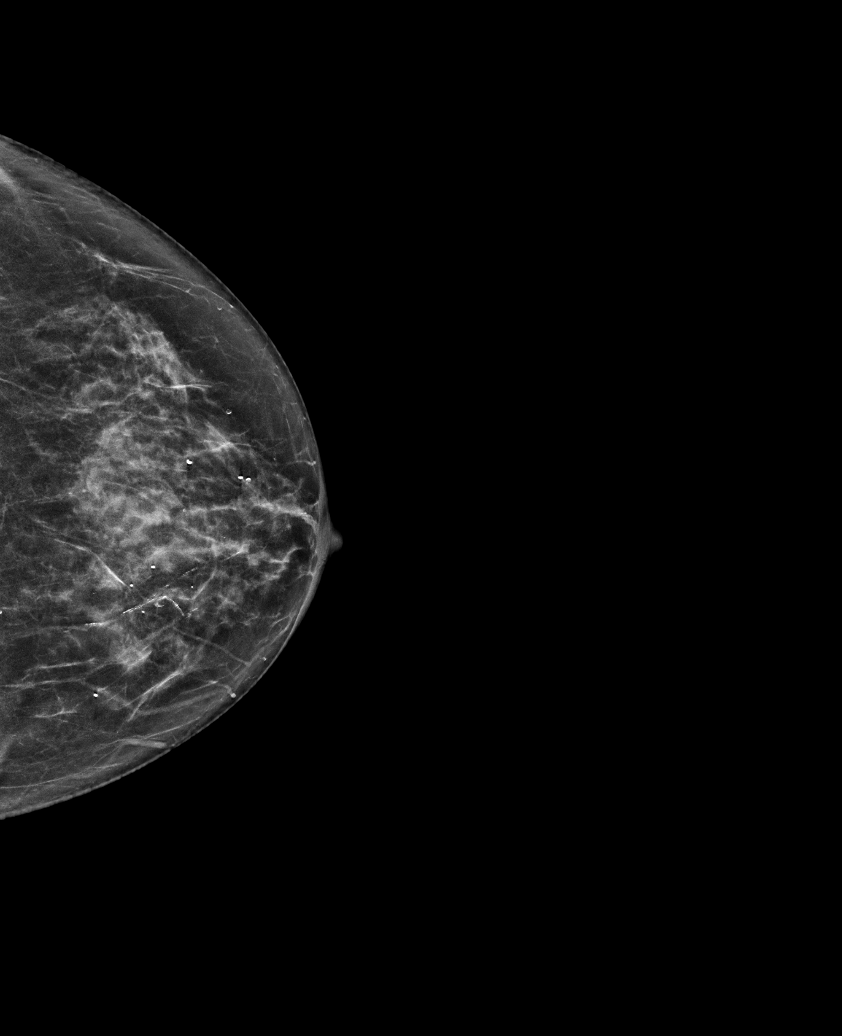

[R CC synth-2D]
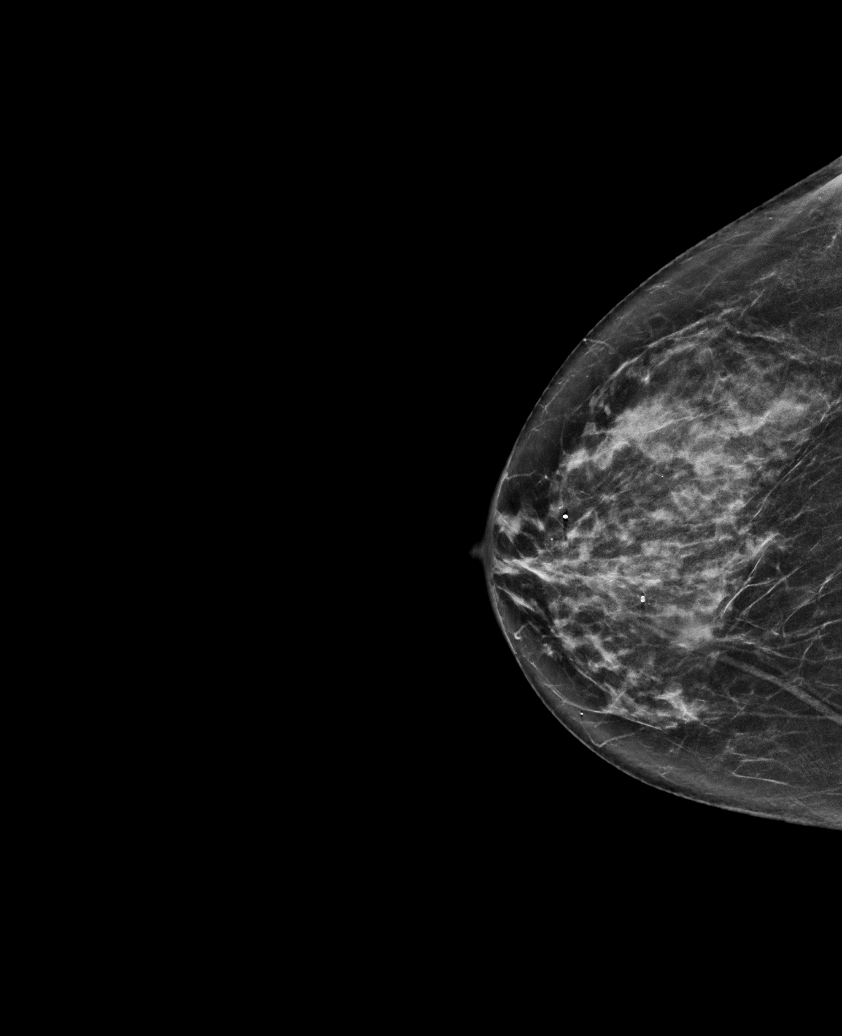

[L MLO tomo · tomo slice 31/62.0]
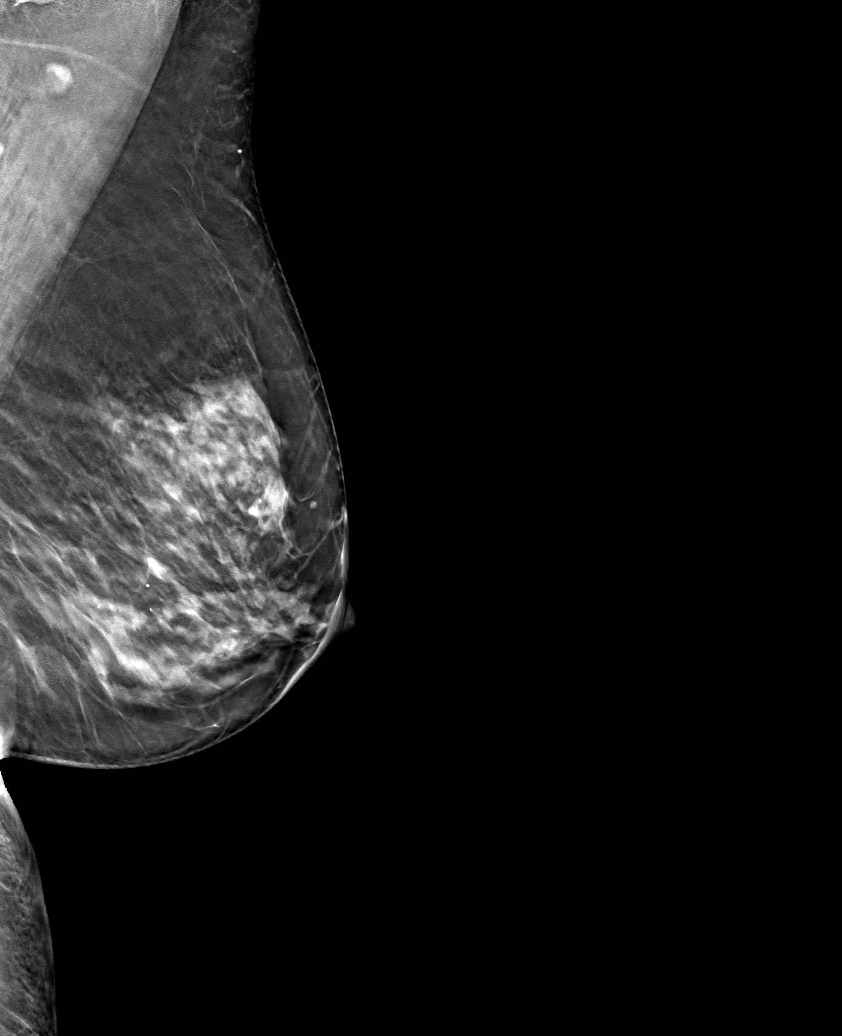

[6 of 30 positions shown; findings below may reference images not displayed]

ACR Breast Density Category c: The breast tissue is heterogeneously
dense, which may obscure small masses.
FINDINGS: There are no findings suspicious for malignancy. Images were
processed with CAD.
IMPRESSION: No mammographic evidence of malignancy. A result letter of this
screening mammogram will be mailed directly to the patient.

RECOMMENDATION:
Screening mammogram in one year. (Code:[id number])

BI-RADS CATEGORY  1: Negative.

## 2022-02-13 MED ORDER — FAMOTIDINE 20 MG PO TABS
20.0000 mg | ORAL_TABLET | Freq: Two times a day (BID) | ORAL | 2 refills | Status: DC
Start: 2022-02-13 — End: 2022-04-27

## 2022-02-13 MED ORDER — GABAPENTIN 300 MG PO CAPS: 300.0000 mg | ORAL_CAPSULE | Freq: Three times a day (TID) | ORAL | 5 refills | Status: DC

## 2022-03-01 ENCOUNTER — Ambulatory Visit: Payer: PPO | Admitting: Family Medicine

## 2022-03-09 DIAGNOSIS — M48062 Spinal stenosis, lumbar region with neurogenic claudication: Secondary | ICD-10-CM | POA: Diagnosis not present

## 2022-03-09 DIAGNOSIS — M5442 Lumbago with sciatica, left side: Secondary | ICD-10-CM | POA: Diagnosis not present

## 2022-03-09 DIAGNOSIS — Z981 Arthrodesis status: Secondary | ICD-10-CM | POA: Diagnosis not present

## 2022-03-09 DIAGNOSIS — M5441 Lumbago with sciatica, right side: Secondary | ICD-10-CM | POA: Diagnosis not present

## 2022-03-09 DIAGNOSIS — M4807 Spinal stenosis, lumbosacral region: Secondary | ICD-10-CM | POA: Diagnosis not present

## 2022-03-09 DIAGNOSIS — G8929 Other chronic pain: Secondary | ICD-10-CM | POA: Diagnosis not present

## 2022-03-16 ENCOUNTER — Other Ambulatory Visit: Payer: Self-pay | Admitting: Neurosurgery

## 2022-03-16 DIAGNOSIS — Z981 Arthrodesis status: Secondary | ICD-10-CM

## 2022-03-16 DIAGNOSIS — M5451 Vertebrogenic low back pain: Secondary | ICD-10-CM | POA: Diagnosis not present

## 2022-03-16 DIAGNOSIS — G8929 Other chronic pain: Secondary | ICD-10-CM

## 2022-03-16 DIAGNOSIS — M48062 Spinal stenosis, lumbar region with neurogenic claudication: Secondary | ICD-10-CM

## 2022-03-30 ENCOUNTER — Ambulatory Visit
Admission: RE | Admit: 2022-03-30 | Discharge: 2022-03-30 | Disposition: A | Discharge status: Home / Self Care | Payer: PPO | Source: Ambulatory Visit | Attending: Neurosurgery | Admitting: Neurosurgery

## 2022-03-30 DIAGNOSIS — M545 Low back pain, unspecified: Secondary | ICD-10-CM | POA: Diagnosis not present

## 2022-03-30 DIAGNOSIS — Z981 Arthrodesis status: Secondary | ICD-10-CM | POA: Insufficient documentation

## 2022-03-30 DIAGNOSIS — M5442 Lumbago with sciatica, left side: Secondary | ICD-10-CM | POA: Insufficient documentation

## 2022-03-30 DIAGNOSIS — M5441 Lumbago with sciatica, right side: Secondary | ICD-10-CM | POA: Diagnosis not present

## 2022-03-30 DIAGNOSIS — M4316 Spondylolisthesis, lumbar region: Secondary | ICD-10-CM | POA: Diagnosis not present

## 2022-03-30 DIAGNOSIS — G8929 Other chronic pain: Secondary | ICD-10-CM

## 2022-03-30 DIAGNOSIS — M549 Dorsalgia, unspecified: Secondary | ICD-10-CM | POA: Diagnosis not present

## 2022-03-30 DIAGNOSIS — M48062 Spinal stenosis, lumbar region with neurogenic claudication: Secondary | ICD-10-CM | POA: Diagnosis not present

## 2022-04-05 DIAGNOSIS — R002 Palpitations: Secondary | ICD-10-CM | POA: Diagnosis not present

## 2022-04-12 ENCOUNTER — Telehealth: Payer: Self-pay

## 2022-04-12 NOTE — Telephone Encounter (Signed)
Return appt with Dr Darreld Mclean 6-8 weeks after her last visit w/ Dr Darreld Mclean after she completes PT. We will need the PT notes. Thanks

## 2022-04-12 NOTE — Telephone Encounter (Signed)
-----   Message from [name] sent at 04/12/2022 10:54 AM EDT ----- Regarding: MRI results Contact: [phone] She had 2 MRIs and CT scan, can she schedule for a return or phone visit? She has a new symptom that her legs cramp at night.

## 2022-04-12 NOTE — Telephone Encounter (Signed)
[facility] gave her exercises to do at home and was told to follow-up with them as needed. Appt with Dr.Yarbrough on 7/20, 6 weeks from last visit.

## 2022-04-12 NOTE — Telephone Encounter (Signed)
Left message to call back  

## 2022-04-12 NOTE — Telephone Encounter (Signed)
Records have been requested.

## 2022-04-13 DIAGNOSIS — E7800 Pure hypercholesterolemia, unspecified: Secondary | ICD-10-CM | POA: Diagnosis not present

## 2022-04-13 DIAGNOSIS — I10 Essential (primary) hypertension: Secondary | ICD-10-CM | POA: Diagnosis not present

## 2022-04-13 DIAGNOSIS — I5032 Chronic diastolic (congestive) heart failure: Secondary | ICD-10-CM | POA: Diagnosis not present

## 2022-04-13 DIAGNOSIS — R0602 Shortness of breath: Secondary | ICD-10-CM | POA: Diagnosis not present

## 2022-04-13 DIAGNOSIS — R002 Palpitations: Secondary | ICD-10-CM | POA: Diagnosis not present

## 2022-04-20 DIAGNOSIS — R0602 Shortness of breath: Secondary | ICD-10-CM | POA: Diagnosis not present

## 2022-04-24 DIAGNOSIS — R002 Palpitations: Secondary | ICD-10-CM | POA: Diagnosis not present

## 2022-04-27 ENCOUNTER — Encounter: Payer: Self-pay | Admitting: Neurosurgery

## 2022-04-27 ENCOUNTER — Ambulatory Visit: Payer: PPO | Admitting: Neurosurgery

## 2022-04-27 VITALS — BP 175/74 | HR 83 | Wt 129.4 lb

## 2022-04-27 DIAGNOSIS — G629 Polyneuropathy, unspecified: Secondary | ICD-10-CM

## 2022-04-27 NOTE — Progress Notes (Signed)
Referring Physician:  Jerrol Banana., MD [address] [place],  [place] [address]  Primary Physician:  Jerrol Banana., MD  History of Present Illness: 04/27/2022 Ms. Jessica Armstrong is here for follow-up.  She has done physical therapy which is helped somewhat.  Her primary issue is burning in her feet when she sleeps.  She can live with the rest of her symptoms.   History of Present Illness: 03/09/2022 Ms. Jessica Armstrong is here today with a chief complaint of pain in her thoracic and low back. She also reports right groin pain, bilateral anterior thigh pain, cramping in her calves, and bilateral leg weakness. The weakness started about 1 month ago and has worsened over the past couple of weeks.  She had surgery and spring 2022 and had temporary relief. She has then been having worsening symptoms over the past 6 months of heaviness and discomfort in her legs. She cannot walk very far. Her legs cramp. She has no upper extremity symptoms. Standing and walking make her symptoms worse. Laying down sometimes makes it worse as well. Heating pad and gabapentin have helped.  Bowel/Bladder Dysfunction: none  Conservative measures:  Physical therapy: has participated after surgery  Multimodal medical therapy including regular antiinflammatories: baclofen, gabapentin, norco, methocarbamol, methylprednisolone, prednisone, tramadol  Injections: has received epidural steroid injections prior to surgery  Past Surgery: L4-5 TLIF on 01/13/21 by Dr. Vertell Limber; cervical surgery 5-6 years ago by Dr Louis [facility] has no symptoms of cervical myelopathy.  The symptoms are causing a significant impact on the patient's life.   Review of Systems:  A 10 point review of systems is negative, except for the pertinent positives and negatives detailed in the HPI.  Past Medical History: Past Medical History:  Diagnosis Date   Allergies    Anemia    mild; no medical treatment   Anxiety     Arthritis    Carotid artery stenosis    a. doppler 0/0349: RICA 17-91%, LICA 5-05% f/u 1 year   Cervical spondyloarthritis    Chronic diastolic CHF (congestive heart failure) ([place])    a. echo 04/2015: EF 55-60%, no RWMA, mitral valve w/ systolic bowing w/o prolapse, PASP 53 mmHg   Claudication ([facility])    Colon polyp    Cystitis    Depression    situational   Diverticulosis    Dizziness    Dyspnea    Dysrhythmia    bradycardia   GERD (gastroesophageal reflux disease)    H/O vertigo    Heart murmur    History of measles    History of mumps    Hypercholesterolemia    Hyperglycemia    Hypertension    vs hypotension   IBS (irritable bowel syndrome)    Need for SBE (subacute bacterial endocarditis) prophylaxis    Neuropathy    Palpitations    PONV (postoperative nausea and vomiting)    severe n/v after anesthesia   Post-menopausal    Pulmonary valve dysplasia    Rotator cuff tear, right    Seasonal allergies    TMJ (dislocation of temporomandibular joint)    TMJ (temporomandibular joint syndrome)    Tricuspid regurgitation    a. echo 04/2013: EF 55-60%, nl wall motion, mild to mod TR, PASP 35 mm Hg    Past Surgical History: Past Surgical History:  Procedure Laterality Date   ABDOMINAL HYSTERECTOMY     ANTERIOR CERVICAL DECOMP/DISCECTOMY FUSION N/A 08/26/2013   Procedure: CERVICAL SIX  TO SEVEN ANTERIOR CERVICAL DECOMPRESSION/DISCECTOMY FUSION 1 LEVEL;  Surgeon: Erline Levine, MD;  Location: [facility];  Service: Neurosurgery;  Laterality: N/A;  C6-7 Anterior cervical decompression/diskectomy/fusion   BREAST BIOPSY Right    neg   CATARACT EXTRACTION W/PHACO Left 05/05/2020   Procedure: CATARACT EXTRACTION PHACO AND INTRAOCULAR LENS PLACEMENT ([place]) LEFT;  Surgeon: Leandrew Koyanagi, MD;  Location: [facility];  Service: Ophthalmology;  Laterality: Left;  3.80 0:43.4 8.8%   CATARACT EXTRACTION W/PHACO Right 06/02/2020   Procedure: CATARACT EXTRACTION PHACO AND  INTRAOCULAR LENS PLACEMENT ([place]) RIGHT;  Surgeon: Leandrew Koyanagi, MD;  Location: [facility];  Service: Ophthalmology;  Laterality: Right;  4.97 0:57.8 8.6%   CHOLECYSTECTOMY     COLONOSCOPY WITH PROPOFOL N/A 01/14/2016   Procedure: COLONOSCOPY WITH PROPOFOL;  Surgeon: Manya Silvas, MD;  Location[facility] END[facility]COPY;  Service: Endoscopy;  Laterality: N/A;   COLONOSCOPY WITH PROPOFOL N/A 11/04/2018   Procedure: COLONOSCOPY WITH PROPOFOL;  Surgeon: Manya Silvas, MD;  [facility];  Service: Endoscopy;  Laterality: N/A;   COLONOSCOPY WITH PROPOFOL N/A 07/09/2019   Procedure: COLONOSCOPY WITH PROPOFOL;  Surgeon: Toledo, Benay Pike, MD;  Location: [facility] ENDOSCOPY;  Service: Gastroenterology;  Laterality: N/A;   ESOPHAGOGASTRODUODENOSCOPY (EGD) WITH PROPOFOL N/A 01/14/2016   Procedure: ESOPHAGOGASTRODUODENOSCOPY (EGD) WITH PROPOFOL;  Surgeon: Manya Silvas, MD;  Location: [facility] ENDOSCOPY;  Service: Endoscopy;  Laterality: N/A;   ESOPHAGOGASTRODUODENOSCOPY (EGD) WITH PROPOFOL N/A 11/04/2018   Procedure: ESOPHAGOGASTRODUODENOSCOPY (EGD) WITH PROPOFOL;  Surgeon: Manya Silvas, MD;  Location: [facility] ENDOSCOPY;  Service: Endoscopy;  Laterality: N/A;   EYE SURGERY     benign tumor of eye   SHOULDER ARTHROSCOPY WITH ROTATOR CUFF REPAIR AND SUBACROMIAL DECOMPRESSION Right 05/31/2017   Procedure: SHOULDER ARTHROSCOPY WITH SUBACROMIAL DECOMPRESSION AND DISTAL CLAVICAL EXCISION;  Surgeon: Tania Ade, MD;  Location: [facility];  Service: Orthopedics;  Laterality: Right;  SHOULDER ARTHROSCOPY WITH SUBACROMIAL DECOMPRESSION AND DISTAL CLAVICAL EXCISION   TRANSFORAMINAL LUMBAR INTERBODY FUSION (TLIF) WITH PEDICLE SCREW FIXATION 1 LEVEL Right 01/13/2021   Procedure: Right Lumbar Four-Five Transforaminal lumbar interbody fusion;  Surgeon: Erline Levine, MD;  Location: [facility];  Service: Neurosurgery;  Laterality: Right;  3C/RM 20    Allergies: Allergies as of 04/27/2022 - Review Complete  11/21/2021  Allergen Reaction Noted   Cholestyramine Nausea And Vomiting 10/22/2018   Ciprofloxacin Nausea And Vomiting 06/15/2015   Codeine sulfate Nausea And Vomiting 04/07/2013   Hydromorphone  03/23/2021   Keflex [cephalexin] Nausea And Vomiting 04/07/2013   Macrobid [nitrofurantoin macrocrystal] Nausea And Vomiting 04/07/2013   Sulfa antibiotics Nausea And Vomiting and Rash 04/07/2013    Medications: No outpatient medications have been marked as taking for the 04/27/22 encounter (Office Visit) with Meade Maw, MD.    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    Drug use: No    Family Medical History: Family History  Problem Relation Age of Onset   Heart disease Mother    Hypertension Mother    COPD Mother        was a smoker   Hypertension Brother    Colon cancer Father    Asthma Maternal Grandfather    Breast cancer  Neg Hx     Physical Examination: There were no vitals filed for this visit.  General: Patient is well developed, well nourished, calm, collected, and in no apparent distress. Attention to examination is appropriate.  Neck:   Supple.  Full range of motion.  Respiratory: Patient is breathing without any difficulty.   NEUROLOGICAL:     Awake, alert, oriented to person, place, and time.  Speech is clear and fluent. Fund of knowledge is appropriate.   Cranial Nerves: Pupils equal round and reactive to light.  Facial tone is symmetric.  Facial sensation is symmetric. Shoulder shrug is symmetric. Tongue protrusion is midline.  There is no pronator drift.  ROM of spine: full.    Strength: Side Biceps Triceps Deltoid Interossei Grip Wrist Ext. Wrist Flex.  R '5 5 5 5 5 5 5  '$ L '5 5 5 5 5 5 5    '$ Side Iliopsoas Quads Hamstring PF DF EHL  R '5 5 5 5 5 5  '$ L '5 5 5 5 5 5   '$ Reflexes are 1+ and symmetric at the biceps, triceps, brachioradialis, patella and achilles.   Hoffman's is absent.  Clonus is not present.  Toes are down-going.  Bilateral upper and lower extremity sensation is intact to light touch.    No evidence of dysmetria noted.  Gait is normal.    Medical Decision Making  Imaging: CT CAP 02/02/21 IMPRESSION:  1. Sigmoid diverticulosis. Mild soft tissue haziness of the around  the sigmoid colon is noted along with a small amount of fluid along  the left pelvic sidewall. Correlate for any clinical signs or  symptoms of acute diverticulitis.  2. Nonobstructive bowel gas pattern.  3. Aortic atherosclerosis.   Aortic Atherosclerosis (ICD10-I70.0).   Electronically Signed    By: Kerby Moors M.D.    On: 02/02/2021 12:48  MRI TL spine 03/30/22 IMPRESSION: 1. Postsurgical changes reflecting posterior instrumented fusion at L4-L5. Grade 1 anterolisthesis is not significantly changed compared to the preoperative study. No significant spinal canal or neural foraminal stenosis. 2. Disc degeneration and bulging at L3-L4 and facet arthropathy resulting in mild left neural foraminal stenosis, not significantly changed. 3. Moderate facet arthropathy at L5-S1 without significant spinal canal or neural foraminal stenosis. 4. Disc degeneration at T12-L1 without significant spinal canal or neural foraminal stenosis. Signal abnormality along the adjacent endplates is favored degenerative in nature. The previously seen superiorly migrated disc extrusion on the study from 2022 has essentially resolved. 5. Mild scoliotic curvature of the lumbar spine is unchanged. 6. Overall mild degenerative changes in the thoracic spine without significant spinal canal or neural foraminal stenosis. No evidence of cord or nerve root compression. No evidence of acute injury in the thoracic  spine.     Electronically Signed   By: Valetta Mole M.D.   On: 03/31/2022 08:54   I have personally reviewed the images and agree with the above interpretation.  Assessment and Plan: Jessica Armstrong is a pleasant 76 y.o. female with chronic back pain with symptoms of neuropathy.  We discussed options for her which include increasing her gabapentin to 600 mg at night versus consideration of spinal cord stimulation.  She would like to try increasing her gabapentin dose for now.  I do not think she is a good candidate for spine surgery.  If she would like to consider a stimulator, she will let me know so that I can refer her for psychology evaluation and to the pain clinic for stimulator evaluation.  I spent a total of 15 minutes in face-to-face and non-face-to-face activities related to this patient's care today.  Thank you for involving me in the care of this patient.      Jessica Singleton K. Izora Ribas MD, [facility] Neurosurgery

## 2022-05-03 ENCOUNTER — Other Ambulatory Visit: Payer: Self-pay | Admitting: Family Medicine

## 2022-05-08 ENCOUNTER — Other Ambulatory Visit: Payer: Self-pay | Admitting: Family Medicine

## 2022-05-09 NOTE — Telephone Encounter (Signed)
last RF 04/13/22 #60 2 RF Requested Prescriptions  Refused Prescriptions Disp Refills  . famotidine (PEPCID) 20 MG tablet [Pharmacy Med Name: FAMOTIDINE 20 MG TAB[*]] 60 tablet 2    Sig: TAKE ONE TABLET BY MOUTH TWICE A DAY     Gastroenterology:  H2 Antagonists Passed - 05/08/2022 10:56 AM      Passed - Valid encounter within last 12 months    Recent Outpatient Visits          5 months ago Encounter for subsequent annual wellness visit (AWV) in Medicare patient   [facility] Jerrol Banana., MD   9 months ago [employer], right   [facility] Jerrol Banana., MD   1 year ago Chronic low back pain with bilateral sciatica, unspecified back pain laterality   [facility] Jerrol Banana., MD   1 year ago Encounter for subsequent annual wellness visit (AWV) in Medicare patient   [facility] Jerrol Banana., MD   1 year ago Acute right-sided low back pain without sciatica   [facility] Chrismon, Vickki Muff, PA-C      Future Appointments            In 6 months Jerrol Banana., MD [facility] [place]

## 2022-05-10 DIAGNOSIS — R002 Palpitations: Secondary | ICD-10-CM | POA: Diagnosis not present

## 2022-05-10 MED ORDER — FAMOTIDINE 20 MG PO TABS: 20.0000 mg | ORAL_TABLET | Freq: Two times a day (BID) | ORAL | 2 refills | Status: AC

## 2022-05-10 NOTE — Telephone Encounter (Signed)
Requested Prescriptions  Pending Prescriptions Disp Refills  . famotidine (PEPCID) 20 MG tablet 180 tablet 2    Sig: Take 1 tablet (20 mg total) by mouth 2 (two) times daily.     Gastroenterology:  H2 Antagonists Passed - 05/10/2022  2:45 PM      Passed - Valid encounter within last 12 months    Recent Outpatient Visits          5 months ago Encounter for subsequent annual wellness visit (AWV) in Medicare patient   [facility] Jerrol Banana., MD   9 months ago [employer], right   [facility] Jerrol Banana., MD   1 year ago Chronic low back pain with bilateral sciatica, unspecified back pain laterality   [facility] Jerrol Banana., MD   1 year ago Encounter for subsequent annual wellness visit (AWV) in Medicare patient   [facility] Jerrol Banana., MD   1 year ago Acute right-sided low back pain without sciatica   [facility] Chrismon, Vickki Muff, PA-C      Future Appointments            In 6 months Jerrol Banana., MD [facility]           Refused Prescriptions Disp Refills  . famotidine (PEPCID) 20 MG tablet [Pharmacy Med Name: FAMOTIDINE 20 MG TAB[*]] 60 tablet 2    Sig: TAKE ONE TABLET BY MOUTH TWICE A DAY     Gastroenterology:  H2 Antagonists Passed - 05/10/2022  2:45 PM      Passed - Valid encounter within last 12 months    Recent Outpatient Visits          5 months ago Encounter for subsequent annual wellness visit (AWV) in Medicare patient   [facility] Jerrol Banana., MD   9 months ago [employer], right   [facility] Jerrol Banana., MD   1 year ago Chronic low back pain with bilateral sciatica, unspecified back pain laterality   [facility] Jerrol Banana., MD   1 year ago Encounter for subsequent annual wellness visit (AWV) in Medicare  patient   [facility] Jerrol Banana., MD   1 year ago Acute right-sided low back pain without sciatica   [facility] Chrismon, Vickki Muff, PA-C      Future Appointments            In 6 months Jerrol Banana., MD [facility] [place]

## 2022-05-10 NOTE — Telephone Encounter (Signed)
Pt stated that for her medication, famotidine (PEPCID) 20 MG tablets, she received 60 tablets. She stated she needs 180 tablets for a 39-monthsupply, not 60 tablets pt stated that this way, it's cheaper for her.  Pt stated she picked up medication but then returned it . Pt requesting the Rx be resent and sent correctly with 180 tablets.   Please advise.

## 2022-05-10 NOTE — Telephone Encounter (Signed)
See previous note

## 2022-05-15 DIAGNOSIS — I5032 Chronic diastolic (congestive) heart failure: Secondary | ICD-10-CM | POA: Diagnosis not present

## 2022-05-15 DIAGNOSIS — R55 Syncope and collapse: Secondary | ICD-10-CM | POA: Diagnosis not present

## 2022-05-15 DIAGNOSIS — R0602 Shortness of breath: Secondary | ICD-10-CM | POA: Diagnosis not present

## 2022-05-15 DIAGNOSIS — E7800 Pure hypercholesterolemia, unspecified: Secondary | ICD-10-CM | POA: Diagnosis not present

## 2022-05-15 DIAGNOSIS — R002 Palpitations: Secondary | ICD-10-CM | POA: Diagnosis not present

## 2022-05-22 ENCOUNTER — Ambulatory Visit: Payer: Self-pay | Admitting: *Deleted

## 2022-05-22 NOTE — Patient Instructions (Signed)
Visit Information  Thank you for taking time to visit with me today. Please don't hesitate to contact me if I can be of assistance to you.   Following are the goals we discussed today:   Goals Addressed             This Visit's Progress    care coordination activities       Care Coordination Interventions:  Care Management program offered/discussed SDOH screening completed Next appointment with PCP for annual physical scheduled for 11/23/22 Needs assessment completed, patient verbalized having no nursing or community resource needs at this time         Please call the care guide team at [phone] if you need to cancel or reschedule your appointment.   If you are experiencing a Mental Health or [place] or need someone to talk to, please call the Suicide and Crisis Lifeline: 988   Patient verbalizes understanding of instructions and care plan provided today and agrees to view in [place]. Active MyChart status and patient understanding of how to access instructions and care plan via MyChart confirmed with patient.     Next PCP appointment scheduled for:  11/23/21  Elliot Gurney, [place] Worker  [facility] Care Management [phone]

## 2022-05-22 NOTE — Patient Outreach (Signed)
  Care Coordination   Initial Visit Note   05/22/2022 Name: Jessica Armstrong MRN: [id number] DOB: 1946-03-10  Jessica Armstrong is a 76 y.o. year old female who sees Jerrol Banana., MD for primary care. I spoke with  Jessica Armstrong by phone today  What matters to the patients health and wellness today?  Patient verbalized having no nursing or community resource needs at this time    Goals Addressed             This Visit's Progress    care coordination activities       Care Coordination Interventions:  Care Management program offered/discussed SDOH screening completed Next appointment with PCP for annual physical scheduled for 11/23/22 Needs assessment completed, patient verbalized having no nursing or community resource needs at this time        SDOH assessments and interventions completed:  Yes  SDOH Interventions Today    Flowsheet Row Most Recent Value  SDOH Interventions   Food Insecurity Interventions Intervention Not Indicated  Financial Strain Interventions Intervention Not Indicated  Housing Interventions Intervention Not Indicated  Stress Interventions Intervention Not Indicated  Transportation Interventions Intervention Not Indicated        Care Coordination Interventions Activated:  Yes  Care Coordination Interventions:  Yes, provided   Follow up plan: No further intervention required.   Encounter Outcome:  Pt. Visit Completed

## 2022-05-25 DIAGNOSIS — H40003 Preglaucoma, unspecified, bilateral: Secondary | ICD-10-CM | POA: Diagnosis not present

## 2022-06-05 ENCOUNTER — Encounter: Payer: Self-pay | Admitting: Family Medicine

## 2022-06-05 ENCOUNTER — Ambulatory Visit (INDEPENDENT_AMBULATORY_CARE_PROVIDER_SITE_OTHER): Payer: PPO | Admitting: Family Medicine

## 2022-06-05 ENCOUNTER — Ambulatory Visit: Payer: Self-pay | Admitting: *Deleted

## 2022-06-05 VITALS — BP 126/60 | HR 77 | Resp 16 | Wt 126.0 lb

## 2022-06-05 DIAGNOSIS — F419 Anxiety disorder, unspecified: Secondary | ICD-10-CM

## 2022-06-05 DIAGNOSIS — M5416 Radiculopathy, lumbar region: Secondary | ICD-10-CM

## 2022-06-05 DIAGNOSIS — G629 Polyneuropathy, unspecified: Secondary | ICD-10-CM | POA: Diagnosis not present

## 2022-06-05 MED ORDER — GABAPENTIN 400 MG PO CAPS: 400.0000 mg | ORAL_CAPSULE | Freq: Three times a day (TID) | ORAL | 5 refills | Status: AC

## 2022-06-05 NOTE — Telephone Encounter (Signed)
Reason for Disposition  [1] Weakness of the face, arm / hand, or leg / foot on one side of the body AND [2] gradual onset (e.g., days to weeks) AND [3] present now  Answer Assessment - Initial Assessment Questions 1. SYMPTOM: "What is the main symptom you are concerned about?" (e.g., weakness, numbness)     Numbness in foot- sometimes pain, right foot 2. ONSET: "When did this start?" (minutes, hours, days; while sleeping)     1-2 weeks 3. LAST NORMAL: "When was the last time you (the patient) were normal (no symptoms)?"     1-2 weeks 4. PATTERN "Does this come and go, or has it been constant since it started?"  "Is it present now?"     Constant , unable to lift leg 5. CARDIAC SYMPTOMS: "Have you had any of the following symptoms: chest pain, difficulty breathing, palpitations?"     no 6. NEUROLOGIC SYMPTOMS: "Have you had any of the following symptoms: headache, dizziness, vision loss, double vision, changes in speech, unsteady on your feet?"     Unsteady on feet- patient has history of neurological problems  7. OTHER SYMPTOMS: "Do you have any other symptoms?"     no 8. PREGNANCY: "Is there any chance you are pregnant?" "When was your last menstrual period?"  Protocols used: Neurologic Deficit-A-AH

## 2022-06-05 NOTE — Telephone Encounter (Signed)
  Chief Complaint: numbness- bottom of R foot, right leg weakness Symptoms: pain at times- numbness Right foot- bottom, unable to lift right leg Frequency: started 2 weeks ago Pertinent Negatives: Patient denies chest pain, difficulty breathing, palpitations Disposition: '[]'$ ED /'[]'$ Urgent Care (no appt availability in office) / '[x]'$ Appointment(In office/virtual)/ '[]'$  [place] [facility]/ '[]'$ Home Care/ '[]'$ Refused Recommended Disposition /'[]'$ [place] Mobile Bus/ '[]'$  Follow-up with PCP Additional Notes:

## 2022-06-05 NOTE — Progress Notes (Unsigned)
Established patient visit  I,[name],acting as a scribe for Wilhemena Durie, MD.,have documented all relevant documentation on the behalf of Wilhemena Durie, MD,as directed by  Wilhemena Durie, MD while in the presence of Wilhemena Durie, MD.   [name]   DOB: 1945/10/25   76 y.o. Female  MRN: [id number] Visit Date: 06/05/2022  Today's healthcare provider: Wilhemena Durie, MD   Chief Complaint  Patient presents with   Foot Problem   Subjective    HPI  Patient has been having right leg pain and right foot pain. Patient states she also has numbness on the bottom of her foot. Patient states she is pain at night in bed. She has been seen by neurosurgery Dr. Cari Caraway and is being referred for consideration of spinal stimulator.  No bowel or bladder symptoms.   Medications: Outpatient Medications Prior to Visit  Medication Sig   aspirin 81 MG EC tablet Take 81 mg by mouth daily.   diazepam (VALIUM) 2 MG tablet TAKE ONE TABLET (2 MG) BY MOUTH EVERY 12HOURS AS NEEDED FOR ANXIETY (Patient taking differently: Take 2 mg by mouth every 12 (twelve) hours as needed (TMJ).)   famotidine (PEPCID) 20 MG tablet Take 1 tablet (20 mg total) by mouth 2 (two) times daily.   gabapentin (NEURONTIN) 300 MG capsule Take 1 capsule (300 mg total) by mouth 3 (three) times daily.   meclizine (ANTIVERT) 25 MG tablet Take 1 tablet (25 mg total) by mouth 2 (two) times daily as needed for dizziness.   PARoxetine (PAXIL) 20 MG tablet Take 1 tablet (20 mg total) by mouth daily.   rosuvastatin (CRESTOR) 10 MG tablet Take 1 tablet (10 mg total) by mouth daily.   triamterene-hydrochlorothiazide (DYAZIDE) 37.5-25 MG capsule Take 1 each (1 capsule total) by mouth daily.   traZODone (DESYREL) 50 MG tablet Take 1-2 Tablets At bedtimes AS Needed (Patient not taking: Reported on 06/05/2022)   No facility-administered medications prior to visit.    Review of Systems  Constitutional:   Negative for appetite change, chills, fatigue and fever.  Respiratory:  Negative for chest tightness and shortness of breath.   Cardiovascular:  Negative for chest pain and palpitations.  Gastrointestinal:  Negative for abdominal pain, nausea and vomiting.  Neurological:  Negative for dizziness and weakness.    Last CBC Lab Results  Component Value Date   WBC 6.4 11/21/2021   HGB 12.2 11/21/2021   HCT 36.1 11/21/2021   MCV 94 11/21/2021   MCH 31.7 11/21/2021   RDW 12.1 11/21/2021   PLT 254 11/21/2021       Objective    BP 126/60 (BP Location: Left Arm, Patient Position: Sitting, Cuff Size: Normal)   Pulse 77   Resp 16   Wt 126 lb (57.2 kg)   SpO2 99%   BMI 19.73 kg/m  BP Readings from Last 3 Encounters:  06/05/22 126/60  04/27/22 (!) 175/74  11/21/21 128/77   Wt Readings from Last 3 Encounters:  06/05/22 126 lb (57.2 kg)  04/27/22 129 lb 6.4 oz (58.7 kg)  11/21/21 131 lb (59.4 kg)      Physical Exam Vitals reviewed.  Constitutional:      General: She is not in acute distress.    Appearance: She is well-developed.  HENT:     Head: Normocephalic and atraumatic.     Right Ear: Hearing normal.     Left Ear: Hearing normal.  Nose: Nose normal.  Eyes:     General: Lids are normal. No scleral icterus.       Right eye: No discharge.        Left eye: No discharge.     Conjunctiva/sclera: Conjunctivae normal.  Cardiovascular:     Rate and Rhythm: Normal rate and regular rhythm.     Heart sounds: Normal heart sounds.  Pulmonary:     Effort: Pulmonary effort is normal. No respiratory distress.  Skin:    Findings: No lesion or rash.  Neurological:     General: No focal deficit present.     Mental Status: She is alert and oriented to person, place, and time.  Psychiatric:        Mood and Affect: Mood normal.        Speech: Speech normal.        Behavior: Behavior normal.        Thought Content: Thought content normal.        Judgment: Judgment normal.        No results found for any visits on 06/05/22.  Assessment & Plan     1. Neuropathy Increased gabapentin from 300 to 400 mg TID. - gabapentin (NEURONTIN) 400 MG capsule; Take 1 capsule (400 mg total) by mouth 3 (three) times daily.  Dispense: 90 capsule; Refill: 5 ly.  Dispense: 90 capsule; Refill: 5  2. Lumbar radiculopathy, right By neurosurgery  3. Anxiety Chronic issue but stable present   No follow-ups on file.      I, Wilhemena Durie, MD, have reviewed all documentation for this visit. The documentation on 06/06/22 for the exam, diagnosis, procedures, and orders are all accurate and complete.    Marija Calamari Cranford Mon, MD  [facility] [phone] (phone) [phone] (fax)  [place]

## 2022-06-07 ENCOUNTER — Telehealth: Payer: Self-pay | Admitting: Family Medicine

## 2022-06-07 DIAGNOSIS — B379 Candidiasis, unspecified: Secondary | ICD-10-CM

## 2022-06-07 NOTE — Telephone Encounter (Signed)
Pt has dental work being done on 9.5.23 and she is needing to start antibiotics because of her heart issue / pt asked if Dr. Rosanna Randy can send in amoxicillin to [place], [address]/ pt asked if office can let her know when these have been sent to the pharmacy

## 2022-06-14 ENCOUNTER — Other Ambulatory Visit: Payer: Self-pay | Admitting: Family Medicine

## 2022-06-14 MED ORDER — AMOXICILLIN 500 MG PO CAPS: 500.0000 mg | ORAL_CAPSULE | ORAL | 4 refills | Status: AC

## 2022-06-14 NOTE — Telephone Encounter (Signed)
Please advise rx for amoxicillin?

## 2022-06-14 NOTE — Telephone Encounter (Addendum)
Pt calling back requesting a follow up on medication request . Pt stated because this was not called in, she had to reschedule her dental appointment.   Appointment was rescheduled to next week. Pt is requesting a call back today.   Please advise.

## 2022-06-21 ENCOUNTER — Ambulatory Visit: Payer: Self-pay | Admitting: *Deleted

## 2022-06-21 ENCOUNTER — Telehealth: Payer: Self-pay | Admitting: Family Medicine

## 2022-06-21 NOTE — Telephone Encounter (Signed)
See other message

## 2022-06-21 NOTE — Telephone Encounter (Signed)
Per agent: "Pt states she was giving antibiotics last week for a dental appt       Pt states she has a yeast infection and requesting a rx for diflucan     Please fu w/ pt "     Chief Complaint: Vaginal Itching Symptoms: Severe itching after completing TABs for dental appt. "Mostly inside but some outside too. Really bad itching." Frequency: Last night Pertinent Negatives: Patient denies Vaginal discharge,fever,rash Disposition: '[]'$ ED /'[]'$ Urgent Care (no appt availability in office) / '[]'$ Appointment(In office/virtual)/ '[]'$  [place] [facility]/ '[]'$ Home Care/ '[]'$ Refused Recommended Disposition /'[]'$ [place] Mobile Bus/ '[x]'$  Follow-up with PCP Additional Notes: Pt requesting Fluconazole be called in. States has taken before and "Cleared right up." States "If he would call in two in case the one doesn't work that would be so appreciated." If appropriate:  [facility] [address] - [place], [place] [employer] AT [address]  [place], [place] [address]  Phone:  [phone]  Fax:  [phone]     Reason for Disposition  MODERATE-SEVERE itching (i.e., interferes with school, work, or sleep)  Answer Assessment - Initial Assessment Questions 1. SYMPTOM: "What's the main symptom you're concerned about?" (e.g., pain, itching, dryness)    Vaginal itching 2. LOCATION: "Where is the  located?" (e.g., inside/outside, left/right)     Inside mostly, outside not as bad 3. ONSET: "When did the  start?"     Last night 4. PAIN: "Is there any pain?" If Yes, ask: "How bad is it?" (Scale: 1-10; mild, moderate, severe)   -  MILD (1-3): Doesn't interfere with normal activities.    -  MODERATE (4-7): Interferes with normal activities (e.g., work or school) or awakens from sleep.     -  SEVERE (8-10): Excruciating pain, unable to do any normal activities.     No 5. ITCHING: "Is there any itching?" If Yes, ask: "How bad is it?" (Scale: 1-10; mild, moderate, severe)      Severe 6. CAUSE: "What do you think is causing the discharge?" "Have you had the same problem before? What happened then?"     No discharge 7. OTHER SYMPTOMS: "Do you have any other symptoms?" (e.g., fever, itching, vaginal bleeding, pain with urination, injury to genital area, vaginal foreign body)     None  Protocols used: Vaginal Symptoms-A-AH

## 2022-06-21 NOTE — Telephone Encounter (Signed)
Please advise 

## 2022-06-21 NOTE — Telephone Encounter (Signed)
See NT encounter, will triage patient's yeast infection symptoms.

## 2022-06-21 NOTE — Telephone Encounter (Signed)
Patient took Amoxicillin for dental procedure and now she has a yeast infection.  Can you send in a Diflucan to [facility] please ASAP.  She said she is itching fiercely.

## 2022-06-21 NOTE — Telephone Encounter (Signed)
Pt states she was giving antibiotics last week for a dental appt    Pt states she has a yeast infection and requesting a rx for diflucan   [place] fu w/ pt

## 2022-06-22 MED ORDER — FLUCONAZOLE 150 MG PO TABS: 150.0000 mg | ORAL_TABLET | Freq: Every day | ORAL | 0 refills | Status: AC

## 2022-06-22 NOTE — Addendum Note (Signed)
Addended by: Smitty Knudsen on: 06/22/2022 09:38 AM   Modules accepted: Orders

## 2022-06-23 ENCOUNTER — Other Ambulatory Visit: Payer: Self-pay | Admitting: Family Medicine

## 2022-06-23 DIAGNOSIS — Z1231 Encounter for screening mammogram for malignant neoplasm of breast: Secondary | ICD-10-CM

## 2022-07-14 ENCOUNTER — Other Ambulatory Visit: Payer: Self-pay

## 2022-08-07 ENCOUNTER — Ambulatory Visit
Admission: RE | Admit: 2022-08-07 | Discharge: 2022-08-07 | Disposition: A | Discharge status: Home / Self Care | Payer: PPO | Source: Ambulatory Visit | Attending: Family Medicine | Admitting: Family Medicine

## 2022-08-07 DIAGNOSIS — Z1231 Encounter for screening mammogram for malignant neoplasm of breast: Secondary | ICD-10-CM | POA: Diagnosis not present

## 2022-09-05 ENCOUNTER — Telehealth: Payer: Self-pay

## 2022-09-05 NOTE — Telephone Encounter (Signed)
Patient advised Jiles Garter is out on medical leave. Patient requesting to talk to [facility]. I did offer to help.  Copied from [place] [phone]. Topic: General - Inquiry >> Sep 05, 2022  4:19 PM Penni Bombard wrote: [phone]    Pt called saying she would like Elina to call her back.  She sad she just has a question for her back.

## 2022-09-14 DIAGNOSIS — I10 Essential (primary) hypertension: Secondary | ICD-10-CM | POA: Diagnosis not present

## 2022-09-14 DIAGNOSIS — I6529 Occlusion and stenosis of unspecified carotid artery: Secondary | ICD-10-CM | POA: Diagnosis not present

## 2022-09-14 DIAGNOSIS — R002 Palpitations: Secondary | ICD-10-CM | POA: Diagnosis not present

## 2022-09-14 DIAGNOSIS — R55 Syncope and collapse: Secondary | ICD-10-CM | POA: Diagnosis not present

## 2022-09-14 DIAGNOSIS — E7800 Pure hypercholesterolemia, unspecified: Secondary | ICD-10-CM | POA: Diagnosis not present

## 2022-09-14 DIAGNOSIS — R0602 Shortness of breath: Secondary | ICD-10-CM | POA: Diagnosis not present

## 2022-09-14 DIAGNOSIS — Z23 Encounter for immunization: Secondary | ICD-10-CM | POA: Diagnosis not present

## 2022-09-14 DIAGNOSIS — I5032 Chronic diastolic (congestive) heart failure: Secondary | ICD-10-CM | POA: Diagnosis not present

## 2022-10-17 ENCOUNTER — Other Ambulatory Visit: Payer: Self-pay | Admitting: Family Medicine

## 2022-10-17 DIAGNOSIS — I10 Essential (primary) hypertension: Secondary | ICD-10-CM

## 2022-10-17 MED ORDER — TRIAMTERENE-HCTZ 37.5-25 MG PO CAPS: 1.0000 | ORAL_CAPSULE | Freq: Every day | ORAL | 1 refills | Status: AC

## 2022-10-17 NOTE — Progress Notes (Signed)
Prescription for Dyazide submitted to pharmacy.  Received telephone advice record from access nurse stating that patient was out of medication.  90-day supply with 1 refill sent to patient's pharmacy, [facility].  Eulis Foster, MD [facility]

## 2022-11-23 ENCOUNTER — Encounter: Payer: PPO | Admitting: Family Medicine

## 2022-11-27 DIAGNOSIS — H40003 Preglaucoma, unspecified, bilateral: Secondary | ICD-10-CM | POA: Diagnosis not present

## 2022-11-27 DIAGNOSIS — Z961 Presence of intraocular lens: Secondary | ICD-10-CM | POA: Diagnosis not present

## 2022-11-27 DIAGNOSIS — H43813 Vitreous degeneration, bilateral: Secondary | ICD-10-CM | POA: Diagnosis not present

## 2022-11-28 DIAGNOSIS — I5032 Chronic diastolic (congestive) heart failure: Secondary | ICD-10-CM | POA: Diagnosis not present

## 2022-11-28 DIAGNOSIS — F3341 Major depressive disorder, recurrent, in partial remission: Secondary | ICD-10-CM | POA: Diagnosis not present

## 2022-11-28 DIAGNOSIS — I10 Essential (primary) hypertension: Secondary | ICD-10-CM | POA: Diagnosis not present

## 2022-11-28 DIAGNOSIS — M26629 Arthralgia of temporomandibular joint, unspecified side: Secondary | ICD-10-CM | POA: Diagnosis not present

## 2022-11-28 DIAGNOSIS — Z0000 Encounter for general adult medical examination without abnormal findings: Secondary | ICD-10-CM | POA: Diagnosis not present

## 2022-11-28 DIAGNOSIS — E7800 Pure hypercholesterolemia, unspecified: Secondary | ICD-10-CM | POA: Diagnosis not present

## 2022-11-28 DIAGNOSIS — I6529 Occlusion and stenosis of unspecified carotid artery: Secondary | ICD-10-CM | POA: Diagnosis not present

## 2022-11-28 DIAGNOSIS — G8929 Other chronic pain: Secondary | ICD-10-CM | POA: Diagnosis not present

## 2022-11-28 DIAGNOSIS — R7303 Prediabetes: Secondary | ICD-10-CM | POA: Diagnosis not present

## 2022-11-28 DIAGNOSIS — Z23 Encounter for immunization: Secondary | ICD-10-CM | POA: Diagnosis not present

## 2023-01-04 DIAGNOSIS — F329 Major depressive disorder, single episode, unspecified: Secondary | ICD-10-CM | POA: Diagnosis not present

## 2023-01-04 DIAGNOSIS — R634 Abnormal weight loss: Secondary | ICD-10-CM | POA: Diagnosis not present

## 2023-01-04 DIAGNOSIS — J0100 Acute maxillary sinusitis, unspecified: Secondary | ICD-10-CM | POA: Diagnosis not present

## 2023-01-04 DIAGNOSIS — K529 Noninfective gastroenteritis and colitis, unspecified: Secondary | ICD-10-CM | POA: Diagnosis not present

## 2023-01-04 DIAGNOSIS — J189 Pneumonia, unspecified organism: Secondary | ICD-10-CM | POA: Diagnosis not present

## 2023-01-04 DIAGNOSIS — R051 Acute cough: Secondary | ICD-10-CM | POA: Diagnosis not present

## 2023-01-04 DIAGNOSIS — F411 Generalized anxiety disorder: Secondary | ICD-10-CM | POA: Diagnosis not present

## 2023-01-11 ENCOUNTER — Telehealth: Payer: Self-pay | Admitting: Family Medicine

## 2023-01-11 NOTE — Telephone Encounter (Signed)
Contacted Jessica Armstrong to schedule their annual wellness visit. Patient declined to schedule AWV at this time. Transferred care to Dr. Alben Spittle new office.   [place] Direct Dial: [phone]

## 2023-01-11 NOTE — Telephone Encounter (Signed)
Contacted Jessica Armstrong to schedule their annual wellness visit. Patient declined to schedule AWV at this time.Transferred care to Dr.  Marland Kitchensignature)*

## 2023-03-12 DIAGNOSIS — Z961 Presence of intraocular lens: Secondary | ICD-10-CM | POA: Diagnosis not present

## 2023-03-12 DIAGNOSIS — H40003 Preglaucoma, unspecified, bilateral: Secondary | ICD-10-CM | POA: Diagnosis not present

## 2023-03-29 DIAGNOSIS — M79642 Pain in left hand: Secondary | ICD-10-CM | POA: Diagnosis not present

## 2023-03-29 DIAGNOSIS — M79641 Pain in right hand: Secondary | ICD-10-CM | POA: Diagnosis not present

## 2023-03-29 DIAGNOSIS — Z981 Arthrodesis status: Secondary | ICD-10-CM | POA: Diagnosis not present

## 2023-03-29 DIAGNOSIS — M542 Cervicalgia: Secondary | ICD-10-CM | POA: Diagnosis not present

## 2023-03-29 DIAGNOSIS — N1831 Chronic kidney disease, stage 3a: Secondary | ICD-10-CM | POA: Diagnosis not present

## 2023-03-29 DIAGNOSIS — M19042 Primary osteoarthritis, left hand: Secondary | ICD-10-CM | POA: Diagnosis not present

## 2023-03-29 DIAGNOSIS — M791 Myalgia, unspecified site: Secondary | ICD-10-CM | POA: Diagnosis not present

## 2023-03-29 DIAGNOSIS — M19041 Primary osteoarthritis, right hand: Secondary | ICD-10-CM | POA: Diagnosis not present

## 2023-03-29 DIAGNOSIS — M47812 Spondylosis without myelopathy or radiculopathy, cervical region: Secondary | ICD-10-CM | POA: Diagnosis not present

## 2023-03-30 DIAGNOSIS — M79642 Pain in left hand: Secondary | ICD-10-CM | POA: Diagnosis not present

## 2023-03-30 DIAGNOSIS — N1831 Chronic kidney disease, stage 3a: Secondary | ICD-10-CM | POA: Diagnosis not present

## 2023-03-30 DIAGNOSIS — M791 Myalgia, unspecified site: Secondary | ICD-10-CM | POA: Diagnosis not present

## 2023-03-30 DIAGNOSIS — M47812 Spondylosis without myelopathy or radiculopathy, cervical region: Secondary | ICD-10-CM | POA: Diagnosis not present

## 2023-03-30 DIAGNOSIS — M79641 Pain in right hand: Secondary | ICD-10-CM | POA: Diagnosis not present

## 2023-05-04 DIAGNOSIS — R202 Paresthesia of skin: Secondary | ICD-10-CM | POA: Diagnosis not present

## 2023-05-04 DIAGNOSIS — G8929 Other chronic pain: Secondary | ICD-10-CM | POA: Diagnosis not present

## 2023-05-04 DIAGNOSIS — R634 Abnormal weight loss: Secondary | ICD-10-CM | POA: Diagnosis not present

## 2023-05-04 DIAGNOSIS — R200 Anesthesia of skin: Secondary | ICD-10-CM | POA: Diagnosis not present

## 2023-05-04 DIAGNOSIS — I10 Essential (primary) hypertension: Secondary | ICD-10-CM | POA: Diagnosis not present

## 2023-05-04 DIAGNOSIS — M19049 Primary osteoarthritis, unspecified hand: Secondary | ICD-10-CM | POA: Diagnosis not present

## 2023-05-04 DIAGNOSIS — R7303 Prediabetes: Secondary | ICD-10-CM | POA: Diagnosis not present

## 2023-05-04 DIAGNOSIS — M545 Low back pain, unspecified: Secondary | ICD-10-CM | POA: Diagnosis not present

## 2023-05-04 DIAGNOSIS — G629 Polyneuropathy, unspecified: Secondary | ICD-10-CM | POA: Diagnosis not present

## 2023-05-04 DIAGNOSIS — M79646 Pain in unspecified finger(s): Secondary | ICD-10-CM | POA: Diagnosis not present

## 2023-05-04 DIAGNOSIS — F411 Generalized anxiety disorder: Secondary | ICD-10-CM | POA: Diagnosis not present

## 2023-05-14 DIAGNOSIS — M79644 Pain in right finger(s): Secondary | ICD-10-CM | POA: Diagnosis not present

## 2023-05-14 DIAGNOSIS — M79645 Pain in left finger(s): Secondary | ICD-10-CM | POA: Diagnosis not present

## 2023-05-14 DIAGNOSIS — M13841 Other specified arthritis, right hand: Secondary | ICD-10-CM | POA: Diagnosis not present

## 2023-06-04 ENCOUNTER — Other Ambulatory Visit: Payer: Self-pay | Admitting: Family Medicine

## 2023-06-04 DIAGNOSIS — Z1231 Encounter for screening mammogram for malignant neoplasm of breast: Secondary | ICD-10-CM

## 2023-06-13 ENCOUNTER — Other Ambulatory Visit: Payer: Self-pay | Admitting: Family Medicine

## 2023-06-13 ENCOUNTER — Inpatient Hospital Stay
Admission: RE | Admit: 2023-06-13 | Discharge: 2023-06-13 | Disposition: A | Discharge status: Home / Self Care | Payer: Self-pay | Source: Ambulatory Visit | Attending: Neurosurgery | Admitting: Neurosurgery

## 2023-06-13 DIAGNOSIS — R0602 Shortness of breath: Secondary | ICD-10-CM | POA: Diagnosis not present

## 2023-06-13 DIAGNOSIS — R002 Palpitations: Secondary | ICD-10-CM | POA: Diagnosis not present

## 2023-06-13 DIAGNOSIS — Z049 Encounter for examination and observation for unspecified reason: Secondary | ICD-10-CM

## 2023-06-13 DIAGNOSIS — I10 Essential (primary) hypertension: Secondary | ICD-10-CM | POA: Diagnosis not present

## 2023-06-13 DIAGNOSIS — R55 Syncope and collapse: Secondary | ICD-10-CM | POA: Diagnosis not present

## 2023-06-13 DIAGNOSIS — Z23 Encounter for immunization: Secondary | ICD-10-CM | POA: Diagnosis not present

## 2023-06-14 DIAGNOSIS — Z90710 Acquired absence of both cervix and uterus: Secondary | ICD-10-CM | POA: Diagnosis not present

## 2023-06-14 DIAGNOSIS — Z90722 Acquired absence of ovaries, bilateral: Secondary | ICD-10-CM | POA: Diagnosis not present

## 2023-06-14 DIAGNOSIS — N939 Abnormal uterine and vaginal bleeding, unspecified: Secondary | ICD-10-CM | POA: Diagnosis not present

## 2023-06-14 DIAGNOSIS — N760 Acute vaginitis: Secondary | ICD-10-CM | POA: Diagnosis not present

## 2023-06-14 DIAGNOSIS — R319 Hematuria, unspecified: Secondary | ICD-10-CM | POA: Diagnosis not present

## 2023-06-14 DIAGNOSIS — Z9079 Acquired absence of other genital organ(s): Secondary | ICD-10-CM | POA: Diagnosis not present

## 2023-06-19 ENCOUNTER — Ambulatory Visit (INDEPENDENT_AMBULATORY_CARE_PROVIDER_SITE_OTHER): Payer: PPO | Admitting: Neurosurgery

## 2023-06-19 ENCOUNTER — Encounter: Payer: Self-pay | Admitting: Neurosurgery

## 2023-06-19 VITALS — BP 134/70 | Ht 67.0 in | Wt 123.8 lb

## 2023-06-19 DIAGNOSIS — G894 Chronic pain syndrome: Secondary | ICD-10-CM

## 2023-06-19 DIAGNOSIS — G629 Polyneuropathy, unspecified: Secondary | ICD-10-CM

## 2023-06-19 NOTE — Progress Notes (Signed)
Referring Physician:  Bosie Clos, MD [address] [place],  [place] [address]  Primary Physician:  Bosie Clos, MD  History of Present Illness: 06/19/2023 Jessica Armstrong returns with continued discomfort on her right side of her body from her neck down to her feet.  She continues to have bothersome numbness in her right leg at times.  She has tried gabapentin and is currently taking 400 mg in the evening.  She gets too sleepy if she takes 3 times a day.  04/27/2022 Jessica Armstrong is here for follow-up.  She has done physical therapy which is helped somewhat.  Her primary issue is burning in her feet when she sleeps.  She can live with the rest of her symptoms.   History of Present Illness: 03/09/2022 Jessica Armstrong is here today with a chief complaint of pain in her thoracic and low back. She also reports right groin pain, bilateral anterior thigh pain, cramping in her calves, and bilateral leg weakness. The weakness started about 1 month ago and has worsened over the past couple of weeks.  She had surgery and spring 2022 and had temporary relief. She has then been having worsening symptoms over the past 6 months of heaviness and discomfort in her legs. She cannot walk very far. Her legs cramp. She has no upper extremity symptoms. Standing and walking make her symptoms worse. Laying down sometimes makes it worse as well. Heating pad and gabapentin have helped.  Bowel/Bladder Dysfunction: none  Conservative measures:  Physical therapy: has participated after surgery  Multimodal medical therapy including regular antiinflammatories: baclofen, gabapentin, norco, methocarbamol, methylprednisolone, prednisone, tramadol  Injections: has received epidural steroid injections prior to surgery  Past Surgery: L4-5 TLIF on 01/13/21 by Dr. Venetia Maxon; cervical surgery 5-6 years ago by Dr [facility] has no symptoms of cervical myelopathy.  The symptoms are causing a significant  impact on the patient's life.   Review of Systems:  A 10 point review of systems is negative, except for the pertinent positives and negatives detailed in the HPI.  Past Medical History: Past Medical History:  Diagnosis Date   Allergies    Anemia    mild; no medical treatment   Anxiety    Arthritis    Carotid artery stenosis    a. doppler 04/2015: RICA 40-59%, LICA 1-39% f/u 1 year   Cervical spondyloarthritis    Chronic diastolic CHF (congestive heart failure) (HCC)    a. echo 04/2015: EF 55-60%, no RWMA, mitral valve w/ systolic bowing w/o prolapse, PASP 53 mmHg   Claudication (HCC)    Colon polyp    Cystitis    Depression    situational   Diverticulosis    Dizziness    Dyspnea    Dysrhythmia    bradycardia   GERD (gastroesophageal reflux disease)    H/O vertigo    Heart murmur    History of measles    History of mumps    Hypercholesterolemia    Hyperglycemia    Hypertension    vs hypotension   IBS (irritable bowel syndrome)    Need for SBE (subacute bacterial endocarditis) prophylaxis    Neuropathy    Palpitations    PONV (postoperative nausea and vomiting)    severe n/v after anesthesia   Post-menopausal    Pulmonary valve dysplasia    Rotator cuff tear, right    Seasonal allergies    TMJ (dislocation of temporomandibular joint)    TMJ (temporomandibular  joint syndrome)    Tricuspid regurgitation    a. echo 04/2013: EF 55-60%, nl wall motion, mild to mod TR, PASP 35 mm Hg    Past Surgical History: Past Surgical History:  Procedure Laterality Date   ABDOMINAL HYSTERECTOMY     ANTERIOR CERVICAL DECOMP/DISCECTOMY FUSION N/A 08/26/2013   Procedure: CERVICAL SIX TO SEVEN ANTERIOR CERVICAL DECOMPRESSION/DISCECTOMY FUSION 1 LEVEL;  Surgeon: Maeola Harman, MD;  Location: [facility];  Service: Neurosurgery;  Laterality: N/A;  C6-7 Anterior cervical decompression/diskectomy/fusion   BREAST BIOPSY Right    neg   CATARACT EXTRACTION W/PHACO Left 05/05/2020    Procedure: CATARACT EXTRACTION PHACO AND INTRAOCULAR LENS PLACEMENT (IOC) LEFT;  Surgeon: Lockie Mola, MD;  Location: [facility];  Service: Ophthalmology;  Laterality: Left;  3.80 0:43.4 8.8%   CATARACT EXTRACTION W/PHACO Right 06/02/2020   Procedure: CATARACT EXTRACTION PHACO AND INTRAOCULAR LENS PLACEMENT (IOC) RIGHT;  Surgeon: Lockie Mola, MD;  Location: [facility];  Service: Ophthalmology;  Laterality: Right;  4.97 0:57.8 8.6%   CHOLECYSTECTOMY     COLONOSCOPY WITH PROPOFOL N/A 01/14/2016   Procedure: COLONOSCOPY WITH PROPOFOL;  Surgeon: Scot Jun, MD;  Location: [facility] ENDOSCOPY;  Service: Endoscopy;  Laterality: N/A;   COLONOSCOPY WITH PROPOFOL N/A 11/04/2018   Procedure: COLONOSCOPY WITH PROPOFOL;  Surgeon: Scot Jun, MD;  Location: [facility] ENDOSCOPY;  Service: Endoscopy;  Laterality: N/A;   COLONOSCOPY WITH PROPOFOL N/A 07/09/2019   Procedure: COLONOSCOPY WITH PROPOFOL;  Surgeon: Toledo, Boykin Nearing, MD;  Location: [facility] ENDOSCOPY;  Service: Gastroenterology;  Laterality: N/A;   ESOPHAGOGASTRODUODENOSCOPY (EGD) WITH PROPOFOL N/A 01/14/2016   Procedure: ESOPHAGOGASTRODUODENOSCOPY (EGD) WITH PROPOFOL;  Surgeon: Scot Jun, MD;  Location: [facility] ENDOSCOPY;  Service: Endoscopy;  Laterality: N/A;   ESOPHAGOGASTRODUODENOSCOPY (EGD) WITH PROPOFOL N/A 11/04/2018   Procedure: ESOPHAGOGASTRODUODENOSCOPY (EGD) WITH PROPOFOL;  Surgeon: Scot Jun, MD;  Location: [facility] ENDOSCOPY;  Service: Endoscopy;  Laterality: N/A;   EYE SURGERY     benign tumor of eye   SHOULDER ARTHROSCOPY WITH ROTATOR CUFF REPAIR AND SUBACROMIAL DECOMPRESSION Right 05/31/2017   Procedure: SHOULDER ARTHROSCOPY WITH SUBACROMIAL DECOMPRESSION AND DISTAL CLAVICAL EXCISION;  Surgeon: Jones Broom, MD;  Location: [facility] OR;  Service: Orthopedics;  Laterality: Right;  SHOULDER ARTHROSCOPY WITH SUBACROMIAL DECOMPRESSION AND DISTAL CLAVICAL EXCISION   TRANSFORAMINAL LUMBAR INTERBODY FUSION  (TLIF) WITH PEDICLE SCREW FIXATION 1 LEVEL Right 01/13/2021   Procedure: Right Lumbar Four-Five Transforaminal lumbar interbody fusion;  Surgeon: Maeola Harman, MD;  Location: [facility] OR;  Service: Neurosurgery;  Laterality: Right;  3C/RM 20    Allergies: Allergies as of 06/19/2023 - Review Complete 06/19/2023  Allergen Reaction Noted   Cholestyramine Nausea And Vomiting 10/22/2018   Ciprofloxacin Nausea And Vomiting 06/15/2015   Codeine sulfate Nausea And Vomiting 04/07/2013   Hydromorphone  03/23/2021   Keflex [cephalexin] Nausea And Vomiting 04/07/2013   Macrobid [nitrofurantoin macrocrystal] Nausea And Vomiting 04/07/2013   Tape  04/27/2022   Sulfa antibiotics Nausea And Vomiting and Rash 04/07/2013    Medications: Current Meds  Medication Sig   aspirin 81 MG EC tablet Take 81 mg by mouth daily.   diazepam (VALIUM) 2 MG tablet TAKE ONE TABLET (2 MG) BY MOUTH EVERY 12HOURS AS NEEDED FOR ANXIETY (Patient taking differently: Take 2 mg by mouth every 12 (twelve) hours as needed (TMJ).)   famotidine (PEPCID) 20 MG tablet Take 1 tablet (20 mg total) by mouth 2 (two) times daily.   fluconazole (DIFLUCAN) 150 MG tablet Take 1 tablet (150 mg total) by  mouth daily at 2 PM.   gabapentin (NEURONTIN) 400 MG capsule Take 1 capsule (400 mg total) by mouth 3 (three) times daily.   meclizine (ANTIVERT) 25 MG tablet Take 1 tablet (25 mg total) by mouth 2 (two) times daily as needed for dizziness.   rosuvastatin (CRESTOR) 10 MG tablet Take 1 tablet (10 mg total) by mouth daily.   triamterene-hydrochlorothiazide (DYAZIDE) 37.5-25 MG capsule Take 1 each (1 capsule total) by mouth daily.    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    Drug use: No     Family Medical History: Family History  Problem Relation Age of Onset   Heart disease Mother    Hypertension Mother    COPD Mother        was a smoker   Hypertension Brother    Colon cancer Father    Asthma Maternal Grandfather    Breast cancer Neg Hx     Physical Examination: Vitals:   06/19/23 1334  BP: 134/70    General: Patient is well developed, well nourished, calm, collected, and in no apparent distress. Attention to examination is appropriate.  Neck:   Supple.  Full range of motion.  Respiratory: Patient is breathing without any difficulty.   NEUROLOGICAL:     Awake, alert, oriented to person, place, and time.  Speech is clear and fluent. Fund of knowledge is appropriate.   Cranial Nerves: Pupils equal round and reactive to light.  Facial tone is symmetric.  Facial sensation is symmetric. Shoulder shrug is symmetric. Tongue protrusion is midline.  There is no pronator drift.  ROM of spine: full.    Strength: Side Biceps Triceps Deltoid Interossei Grip Wrist Ext. Wrist Flex.  R 5 5 5 5 5 5 5   L 5 5 5 5 5 5 5    Side Iliopsoas Quads Hamstring PF DF EHL  R 5 5 5 5 5 5   L 5 5 5 5 5 5    Reflexes are 1+ and symmetric at the biceps, triceps, brachioradialis, patella and achilles.   Hoffman's is absent.  Clonus is not present.  Toes are down-going.  Bilateral upper and lower extremity sensation is intact to light touch.    No evidence of dysmetria noted.  Gait is normal.    Medical Decision Making  Imaging: CT CAP 02/02/21 IMPRESSION:  1. Sigmoid diverticulosis. Mild soft tissue haziness of the around  the sigmoid colon is noted along with a small amount of fluid along  the left pelvic sidewall. Correlate for any clinical signs or  symptoms of acute diverticulitis.  2. Nonobstructive bowel gas pattern.  3. Aortic atherosclerosis.   Aortic Atherosclerosis (ICD10-I70.0).   Electronically Signed    By: Signa Kell M.D.    On: 02/02/2021  12:48  MRI TL spine 03/30/22 IMPRESSION: 1. Postsurgical changes reflecting posterior instrumented fusion at L4-L5. Grade 1 anterolisthesis is not significantly changed compared to the preoperative study. No significant spinal canal or neural foraminal stenosis. 2. Disc degeneration and bulging at L3-L4 and facet arthropathy resulting in mild left neural foraminal stenosis, not significantly changed. 3. Moderate facet arthropathy at L5-S1 without significant spinal canal or neural foraminal stenosis. 4.  Disc degeneration at T12-L1 without significant spinal canal or neural foraminal stenosis. Signal abnormality along the adjacent endplates is favored degenerative in nature. The previously seen superiorly migrated disc extrusion on the study from 2022 has essentially resolved. 5. Mild scoliotic curvature of the lumbar spine is unchanged. 6. Overall mild degenerative changes in the thoracic spine without significant spinal canal or neural foraminal stenosis. No evidence of cord or nerve root compression. No evidence of acute injury in the thoracic spine.     Electronically Signed   By: Lesia Hausen M.D.   On: 03/31/2022 08:54   I have personally reviewed the images and agree with the above interpretation.  Assessment and Plan: Jessica Armstrong is a pleasant 77 y.o. female with chronic back pain with symptoms of neuropathy.  We discussed treatment options including increasing gabapentin, trying Lyrica, or considering a spinal cord stimulator.  We will increase her gabapentin to 400 mg twice daily and check her condition in a couple weeks.  If she is not improved at that time, we will either switch her to Lyrica or consider spinal cord stimulator.     I spent a total of 15 minutes in face-to-face and non-face-to-face activities related to this patient's care today.  Thank you for involving me in the care of this patient.      Tianne Plott K. Myer Haff MD, [facility] Neurosurgery

## 2023-06-25 ENCOUNTER — Telehealth: Payer: Self-pay | Admitting: Neurosurgery

## 2023-06-25 NOTE — Telephone Encounter (Signed)
Patient seen Dr.Yarbrough on 06/19/2023. He mentioned referring her to neurology for neuropathy but she forgot the doctor's name. Can you give her that information again. Several of her friends have recommended she see Dr. Maudry Diego at [facility] for pain. She would like Dr.Yarbrough's opinion on that provider.

## 2023-06-26 DIAGNOSIS — G629 Polyneuropathy, unspecified: Secondary | ICD-10-CM | POA: Diagnosis not present

## 2023-06-26 DIAGNOSIS — I10 Essential (primary) hypertension: Secondary | ICD-10-CM | POA: Diagnosis not present

## 2023-06-26 DIAGNOSIS — R7303 Prediabetes: Secondary | ICD-10-CM | POA: Diagnosis not present

## 2023-06-26 DIAGNOSIS — R634 Abnormal weight loss: Secondary | ICD-10-CM | POA: Diagnosis not present

## 2023-06-26 DIAGNOSIS — R319 Hematuria, unspecified: Secondary | ICD-10-CM | POA: Diagnosis not present

## 2023-06-26 DIAGNOSIS — N939 Abnormal uterine and vaginal bleeding, unspecified: Secondary | ICD-10-CM | POA: Diagnosis not present

## 2023-06-26 DIAGNOSIS — N1831 Chronic kidney disease, stage 3a: Secondary | ICD-10-CM | POA: Diagnosis not present

## 2023-06-26 DIAGNOSIS — N760 Acute vaginitis: Secondary | ICD-10-CM | POA: Diagnosis not present

## 2023-06-26 NOTE — Telephone Encounter (Signed)
I spoke with Mrs Jessica Armstrong. I informed her that we can refer her to a neurologist and I gave her the name of Dr Cristopher Peru in [place] or Dr Jacquelyne Balint or Nita Sickle in [place].   I informed her that Dr Myer Haff is not familiar with Dr Leda Min at [facility], but that he said he is happy to place a referral there if she would like one.  She was very appreciative of the above information. She will call back if she decides she would like a referral to any of the above providers.

## 2023-07-02 ENCOUNTER — Telehealth: Payer: Self-pay

## 2023-07-02 NOTE — Telephone Encounter (Signed)
Noted  

## 2023-07-02 NOTE — Telephone Encounter (Signed)
FYI-I spoke with the patient she stated that the increase of Gabapentin has helped some, but she is still having a lot of pain in her hips, legs and feet.   She is scheduled to see Dr. Sherryll Burger on 07/12/23.

## 2023-07-02 NOTE — Telephone Encounter (Signed)
-----   Message from [facility] C sent at 06/19/2023  2:10 PM EDT ----- Call the patient and check on her. Is the increase in Gabapentin helping?

## 2023-07-04 DIAGNOSIS — R29810 Facial weakness: Secondary | ICD-10-CM | POA: Diagnosis not present

## 2023-07-04 DIAGNOSIS — M792 Neuralgia and neuritis, unspecified: Secondary | ICD-10-CM | POA: Diagnosis not present

## 2023-07-05 ENCOUNTER — Other Ambulatory Visit: Payer: Self-pay | Admitting: Neurology

## 2023-07-05 DIAGNOSIS — R29810 Facial weakness: Secondary | ICD-10-CM

## 2023-07-05 DIAGNOSIS — M792 Neuralgia and neuritis, unspecified: Secondary | ICD-10-CM

## 2023-07-24 ENCOUNTER — Ambulatory Visit
Admission: RE | Admit: 2023-07-24 | Discharge: 2023-07-24 | Disposition: A | Discharge status: Home / Self Care | Payer: PPO | Source: Ambulatory Visit | Attending: Neurology | Admitting: Neurology

## 2023-07-24 DIAGNOSIS — M4312 Spondylolisthesis, cervical region: Secondary | ICD-10-CM | POA: Diagnosis not present

## 2023-07-24 DIAGNOSIS — R29810 Facial weakness: Secondary | ICD-10-CM

## 2023-07-24 DIAGNOSIS — M4802 Spinal stenosis, cervical region: Secondary | ICD-10-CM | POA: Diagnosis not present

## 2023-07-24 DIAGNOSIS — M792 Neuralgia and neuritis, unspecified: Secondary | ICD-10-CM

## 2023-07-24 DIAGNOSIS — I6789 Other cerebrovascular disease: Secondary | ICD-10-CM | POA: Diagnosis not present

## 2023-07-24 DIAGNOSIS — Z981 Arthrodesis status: Secondary | ICD-10-CM | POA: Diagnosis not present

## 2023-08-01 DIAGNOSIS — R202 Paresthesia of skin: Secondary | ICD-10-CM | POA: Diagnosis not present

## 2023-08-01 DIAGNOSIS — R200 Anesthesia of skin: Secondary | ICD-10-CM | POA: Diagnosis not present

## 2023-08-01 DIAGNOSIS — M792 Neuralgia and neuritis, unspecified: Secondary | ICD-10-CM | POA: Diagnosis not present

## 2023-08-06 DIAGNOSIS — M792 Neuralgia and neuritis, unspecified: Secondary | ICD-10-CM | POA: Diagnosis not present

## 2023-08-06 DIAGNOSIS — R7309 Other abnormal glucose: Secondary | ICD-10-CM | POA: Diagnosis not present

## 2023-08-10 ENCOUNTER — Ambulatory Visit
Admission: RE | Admit: 2023-08-10 | Discharge: 2023-08-10 | Disposition: A | Discharge status: Home / Self Care | Payer: PPO | Source: Ambulatory Visit | Attending: Family Medicine | Admitting: Family Medicine

## 2023-08-10 DIAGNOSIS — Z1231 Encounter for screening mammogram for malignant neoplasm of breast: Secondary | ICD-10-CM | POA: Insufficient documentation

## 2023-08-27 DIAGNOSIS — M792 Neuralgia and neuritis, unspecified: Secondary | ICD-10-CM | POA: Diagnosis not present

## 2023-08-27 DIAGNOSIS — R29810 Facial weakness: Secondary | ICD-10-CM | POA: Diagnosis not present

## 2023-09-11 DIAGNOSIS — H40003 Preglaucoma, unspecified, bilateral: Secondary | ICD-10-CM | POA: Diagnosis not present

## 2023-09-18 DIAGNOSIS — H26491 Other secondary cataract, right eye: Secondary | ICD-10-CM | POA: Diagnosis not present

## 2023-09-18 DIAGNOSIS — H40003 Preglaucoma, unspecified, bilateral: Secondary | ICD-10-CM | POA: Diagnosis not present

## 2023-09-18 DIAGNOSIS — Z961 Presence of intraocular lens: Secondary | ICD-10-CM | POA: Diagnosis not present

## 2023-09-18 DIAGNOSIS — H43813 Vitreous degeneration, bilateral: Secondary | ICD-10-CM | POA: Diagnosis not present

## 2024-07-15 ENCOUNTER — Other Ambulatory Visit: Payer: Self-pay | Admitting: Family Medicine

## 2024-07-15 DIAGNOSIS — Z1231 Encounter for screening mammogram for malignant neoplasm of breast: Secondary | ICD-10-CM

## 2024-08-27 ENCOUNTER — Ambulatory Visit
Admission: RE | Admit: 2024-08-27 | Discharge: 2024-08-27 | Disposition: A | Discharge status: Home / Self Care | Source: Ambulatory Visit | Attending: Family Medicine | Admitting: Family Medicine

## 2024-08-27 DIAGNOSIS — Z1231 Encounter for screening mammogram for malignant neoplasm of breast: Secondary | ICD-10-CM | POA: Diagnosis present

## 2024-10-30 ENCOUNTER — Telehealth: Payer: Self-pay | Admitting: Family Medicine

## 2024-10-30 DIAGNOSIS — I10 Essential (primary) hypertension: Secondary | ICD-10-CM

## 2024-10-30 NOTE — Telephone Encounter (Signed)
 Provider not at this office. Rx declined

## 2024-10-30 NOTE — Telephone Encounter (Signed)
 Refill Request   Pharmacy: [facility]  Medication: triamterene -hydrochlorothiazide  (DYAZIDE ) 37.5-25 MG capsule   Quantity (if provided): 90  Please send in refill request.
